# Patient Record
Sex: Male | Born: 1960 | Hispanic: No | Marital: Married | Smoking: Never smoker
Health system: Southern US, Community
[De-identification: ages and names within clinical notes are randomized; demographics above are authoritative.]

## PROBLEM LIST (undated history)

## (undated) DIAGNOSIS — E119 Type 2 diabetes mellitus without complications: Secondary | ICD-10-CM

## (undated) DIAGNOSIS — E785 Hyperlipidemia, unspecified: Secondary | ICD-10-CM

## (undated) HISTORY — DX: Hyperlipidemia, unspecified: E78.5

## (undated) HISTORY — DX: Type 2 diabetes mellitus without complications: E11.9

## (undated) HISTORY — PX: SINUS SURGERY WITH INSTATRAK: SHX5215

---

## 2014-12-06 ENCOUNTER — Ambulatory Visit (INDEPENDENT_AMBULATORY_CARE_PROVIDER_SITE_OTHER): Payer: BLUE CROSS/BLUE SHIELD | Admitting: Cardiology

## 2014-12-06 ENCOUNTER — Encounter: Payer: Self-pay | Admitting: Cardiology

## 2014-12-06 ENCOUNTER — Encounter: Payer: Self-pay | Admitting: *Deleted

## 2014-12-06 VITALS — BP 112/84 | HR 75 | Ht 68.0 in | Wt 269.5 lb

## 2014-12-06 DIAGNOSIS — I517 Cardiomegaly: Secondary | ICD-10-CM

## 2014-12-06 DIAGNOSIS — R062 Wheezing: Secondary | ICD-10-CM

## 2014-12-06 NOTE — Patient Instructions (Signed)
Your physician recommends that you schedule a follow-up appointment in: AS NEEDED PENDING TEST RESULTS  Your physician has requested that you have an echocardiogram. Echocardiography is a painless test that uses sound waves to create images of your heart. It provides your doctor with information about the size and shape of your heart and how well your heart's chambers and valves are working. This procedure takes approximately one hour. There are no restrictions for this procedure.    

## 2014-12-06 NOTE — Assessment & Plan Note (Signed)
Symptoms seem most consistent with asthma. If LV function normal with treat with bronchodilators. I will leave this to primary care.

## 2014-12-06 NOTE — Assessment & Plan Note (Signed)
Discussed weight loss. 

## 2014-12-06 NOTE — Progress Notes (Signed)
     HPI: 54 year old male for evaluation of cardiomegaly. This was noted on recent chest x-ray. Patient has noticed some mild cough and wheezing recently. He had his chest x-ray for that reason. He denies dyspnea on exertion, orthopnea, PND, pedal edema, chest pain, palpitations or syncope.  No current outpatient prescriptions on file.   No current facility-administered medications for this visit.    No Known Allergies   Past Medical History  Diagnosis Date  . Diabetes     Diet controlled  . Hyperlipidemia     Past Surgical History  Procedure Laterality Date  . Sinus surgery with instatrak      History   Social History  . Marital Status: Married    Spouse Name: N/A    Number of Children: 2  . Years of Education: N/A   Occupational History  . Not on file.   Social History Main Topics  . Smoking status: Never Smoker   . Smokeless tobacco: Not on file  . Alcohol Use: No  . Drug Use: No  . Sexual Activity: Not on file   Other Topics Concern  . Not on file   Social History Narrative    Family History  Problem Relation Age of Onset  . Heart disease      No family history    ROS: no fevers or chills, productive cough, hemoptysis, dysphasia, odynophagia, melena, hematochezia, dysuria, hematuria, rash, seizure activity, orthopnea, PND, pedal edema, claudication. Remaining systems are negative.  Physical Exam:   Blood pressure 112/84, pulse 75, height 5\' 8"  (1.727 m), weight 269 lb 8 oz (122.244 kg).  General:  Well developed/obese in NAD Skin warm/dry Patient not depressed No peripheral clubbing Back-normal HEENT-normal/normal eyelids Neck supple/normal carotid upstroke bilaterally; no bruits; no JVD; no thyromegaly chest - diffuse expiratory wheeze with forced expiration. CV - RRR/normal S1 and S2; no murmurs, rubs or gallops;  PMI nondisplaced Abdomen -NT/ND, no HSM, no mass, + bowel sounds, no bruit 2+ femoral pulses, no bruits Ext-no edema, chords,  2+ DP Neuro-grossly nonfocal  ECG sinus rhythm at a rate of 75. No ST changes.

## 2014-12-06 NOTE — Assessment & Plan Note (Signed)
Patient's symptoms seem most consistent with asthma. Cardiomegaly was noted on chest x-ray. I will schedule an echocardiogram to assess LV function. If normal would not pursue further cardiac evaluation.

## 2014-12-08 ENCOUNTER — Ambulatory Visit (HOSPITAL_COMMUNITY)
Admission: RE | Admit: 2014-12-08 | Discharge: 2014-12-08 | Disposition: A | Discharge status: Home / Self Care | Payer: BLUE CROSS/BLUE SHIELD | Source: Ambulatory Visit | Attending: Internal Medicine | Admitting: Internal Medicine

## 2014-12-08 DIAGNOSIS — I517 Cardiomegaly: Secondary | ICD-10-CM | POA: Diagnosis present

## 2014-12-08 NOTE — Progress Notes (Signed)
2D Echo Performed 12/08/2014    Beula Joyner, RCS  

## 2014-12-29 ENCOUNTER — Ambulatory Visit: Payer: Self-pay | Admitting: Cardiovascular Disease

## 2015-01-03 ENCOUNTER — Other Ambulatory Visit (HOSPITAL_COMMUNITY): Payer: Self-pay | Admitting: Radiology

## 2015-01-03 DIAGNOSIS — R062 Wheezing: Secondary | ICD-10-CM

## 2015-01-04 ENCOUNTER — Other Ambulatory Visit (HOSPITAL_COMMUNITY): Payer: Self-pay | Admitting: Radiology

## 2015-02-15 ENCOUNTER — Encounter (HOSPITAL_COMMUNITY): Payer: Self-pay | Admitting: Emergency Medicine

## 2015-02-15 ENCOUNTER — Emergency Department (HOSPITAL_COMMUNITY)
Admission: EM | Admit: 2015-02-15 | Discharge: 2015-02-15 | Disposition: A | Discharge status: Home / Self Care | Payer: BLUE CROSS/BLUE SHIELD | Source: Home / Self Care | Attending: Emergency Medicine | Admitting: Emergency Medicine

## 2015-02-15 ENCOUNTER — Emergency Department (INDEPENDENT_AMBULATORY_CARE_PROVIDER_SITE_OTHER): Payer: BLUE CROSS/BLUE SHIELD

## 2015-02-15 DIAGNOSIS — R062 Wheezing: Secondary | ICD-10-CM

## 2015-02-15 LAB — BRAIN NATRIURETIC PEPTIDE: B Natriuretic Peptide: 7.1 pg/mL (ref 0.0–100.0)

## 2015-02-15 LAB — POCT I-STAT, CHEM 8
BUN: 6 mg/dL (ref 6–23)
CREATININE: 0.8 mg/dL (ref 0.50–1.35)
Calcium, Ion: 1.12 mmol/L (ref 1.12–1.23)
Chloride: 99 mmol/L (ref 96–112)
Glucose, Bld: 100 mg/dL — ABNORMAL HIGH (ref 70–99)
HCT: 45 % (ref 39.0–52.0)
Hemoglobin: 15.3 g/dL (ref 13.0–17.0)
Potassium: 3.8 mmol/L (ref 3.5–5.1)
SODIUM: 137 mmol/L (ref 135–145)
TCO2: 23 mmol/L (ref 0–100)

## 2015-02-15 MED ORDER — IPRATROPIUM BROMIDE 0.02 % IN SOLN
RESPIRATORY_TRACT | Status: AC
  Filled 2015-02-15: qty 2.5

## 2015-02-15 MED ORDER — ALBUTEROL SULFATE (2.5 MG/3ML) 0.083% IN NEBU
INHALATION_SOLUTION | RESPIRATORY_TRACT | Status: AC
  Filled 2015-02-15: qty 6

## 2015-02-15 MED ORDER — IPRATROPIUM BROMIDE 0.02 % IN SOLN
0.5000 mg | Freq: Once | RESPIRATORY_TRACT | Status: AC
  Administered 2015-02-15: 0.5 mg via RESPIRATORY_TRACT

## 2015-02-15 MED ORDER — AEROCHAMBER PLUS FLO-VU LARGE MISC: 1.0000 | Freq: Once | Status: AC

## 2015-02-15 MED ORDER — ALBUTEROL SULFATE (2.5 MG/3ML) 0.083% IN NEBU
5.0000 mg | INHALATION_SOLUTION | Freq: Once | RESPIRATORY_TRACT | Status: AC
  Administered 2015-02-15: 5 mg via RESPIRATORY_TRACT

## 2015-02-15 MED ORDER — METHYLPREDNISOLONE SODIUM SUCC 125 MG IJ SOLR
125.0000 mg | Freq: Once | INTRAMUSCULAR | Status: AC
  Administered 2015-02-15: 125 mg via INTRAMUSCULAR

## 2015-02-15 MED ORDER — METHYLPREDNISOLONE SODIUM SUCC 125 MG IJ SOLR
INTRAMUSCULAR | Status: AC
  Filled 2015-02-15: qty 2

## 2015-02-15 MED ORDER — FLUTICASONE-SALMETEROL 250-50 MCG/DOSE IN AEPB: 1.0000 | INHALATION_SPRAY | Freq: Two times a day (BID) | RESPIRATORY_TRACT | Status: DC

## 2015-02-15 MED ORDER — ALBUTEROL SULFATE HFA 108 (90 BASE) MCG/ACT IN AERS: 2.0000 | INHALATION_SPRAY | RESPIRATORY_TRACT | Status: DC | PRN

## 2015-02-15 MED ORDER — PREDNISONE 50 MG PO TABS: 50.0000 mg | ORAL_TABLET | Freq: Every day | ORAL | Status: DC

## 2015-02-15 NOTE — ED Provider Notes (Signed)
CSN: [id number]     Arrival date & time 02/15/15  1233 History   First MD Initiated Contact with Patient 02/15/15 1425     Chief Complaint  Patient presents with  . Shortness of Breath   (Consider location/radiation/quality/duration/timing/severity/associated sxs/prior Treatment) HPI        54 year old male presents for evaluation of wheezing and shortness of breath and coughing. He says this problem has been going on intermittently for 6 months but it got worse 3 months ago. He went to his primary care provider and had a chest x-ray and an EKG and was referred to cardiology. He had a normal echo and was told to follow-up with primary care. He has now been referred to pulmonology, he has an appointment in 2 weeks. However he he and his wife are concerned that his symptoms are worsening and they would like to be evaluated again today. They say his wheezing much worse at night. He was supposed to get a sleep study but he has not done that yet. He has chronic leg swelling that is worse when he is on his feet for a long time, he is unsure exactly how long he has had that. Denies any chest pain or any previous history of asthma. His primary care providers not made any attempts at treatment for his symptoms thus far. He used his daughter's nebulized Xopenex 2 days ago which he feels helped.   Past Medical History  Diagnosis Date  . Diabetes     Diet controlled  . Hyperlipidemia    Past Surgical History  Procedure Laterality Date  . Sinus surgery with instatrak     Family History  Problem Relation Age of Onset  . Heart disease      No family history   History  Substance Use Topics  . Smoking status: Never Smoker   . Smokeless tobacco: Not on file  . Alcohol Use: No    Review of Systems  All other systems reviewed and are negative.   Allergies  Review of patient's allergies indicates no known allergies.  Home Medications   Prior to Admission medications   Medication Sig Start Date  End Date Taking? Authorizing Provider  albuterol (PROVENTIL HFA;VENTOLIN HFA) 108 (90 BASE) MCG/ACT inhaler Inhale 2 puffs into the lungs every 4 (four) hours as needed for wheezing. 02/15/15   Graylon Good, PA-C  Fluticasone-Salmeterol (ADVAIR DISKUS) 250-50 MCG/DOSE AEPB Inhale 1 puff into the lungs every 12 (twelve) hours. 02/15/15   Graylon Good, PA-C  predniSONE (DELTASONE) 50 MG tablet Take 1 tablet (50 mg total) by mouth daily with breakfast. 02/15/15   Graylon Good, PA-C  Spacer/Aero-Holding Chambers (AEROCHAMBER PLUS FLO-VU LARGE) MISC 1 each by Other route once. 02/15/15   Adrian Blackwater Latoria Dry, PA-C   BP 125/83 mmHg  Pulse 80  Temp(Src) 97.1 F (36.2 C) (Oral)  Resp 16  SpO2 97% Physical Exam  Constitutional: He is oriented to person, place, and time. He appears well-developed and well-nourished. No distress.  HENT:  Head: Normocephalic.  Pulmonary/Chest: Effort normal. No respiratory distress.  Neurological: He is alert and oriented to person, place, and time. Coordination normal.  Skin: Skin is warm and dry. No rash noted. He is not diaphoretic.  Psychiatric: He has a normal mood and affect. Judgment normal.  Nursing note and vitals reviewed.   ED Course  ED EKG  Date/Time: 02/15/2015 3:13 PM Performed by: Graylon Good Authorized by: Graylon Good Interpreted by ED physician Previous  ECG: no previous ECG available Rhythm: sinus rhythm Rate: normal QRS axis: normal Conduction: conduction normal ST Segments: ST segments normal T Waves: T waves normal Other: no other findings Clinical impression: normal ECG Comments: EKG independently reviewed by me as above   (including critical care time) Labs Review Labs Reviewed  POCT I-STAT, CHEM 8 - Abnormal; Notable for the following:    Glucose, Bld 100 (*)    All other components within normal limits  BRAIN NATRIURETIC PEPTIDE    Imaging Review Dg Chest 2 View  02/15/2015   CLINICAL DATA:  Cough and wheezing  for 1 week.  EXAM: CHEST  2 VIEW  COMPARISON:  12/02/2014  FINDINGS: There is chronic cardiomegaly. Pulmonary vascularity is normal than the lungs are clear. No effusions. No acute osseous abnormality.  IMPRESSION: Chronic cardiomegaly.  No acute abnormality.   Electronically Signed   By: Francene BoyersJames  Maxwell M.D.   On: 02/15/2015 15:11     MDM   1. Wheezing    After 1 nebulizer treatment, he has mild subjective improvement but he is still wheezing. He does not feel short of breath right now. His labs and his x-ray both normal. This is most likely asthma which may be induced by allergies, he definitely needs to follow-up with pulmonology as scheduled. We'll start him on prednisone as well as albuterol and Advair, follow-up with pulmonology   Meds ordered this encounter  Medications  . albuterol (PROVENTIL) (2.5 MG/3ML) 0.083% nebulizer solution 5 mg    Sig:   . ipratropium (ATROVENT) nebulizer solution 0.5 mg    Sig:   . methylPREDNISolone sodium succinate (SOLU-MEDROL) 125 mg/2 mL injection 125 mg    Sig:   . Fluticasone-Salmeterol (ADVAIR DISKUS) 250-50 MCG/DOSE AEPB    Sig: Inhale 1 puff into the lungs every 12 (twelve) hours.    Dispense:  60 each    Refill:  0  . predniSONE (DELTASONE) 50 MG tablet    Sig: Take 1 tablet (50 mg total) by mouth daily with breakfast.    Dispense:  5 tablet    Refill:  0  . albuterol (PROVENTIL HFA;VENTOLIN HFA) 108 (90 BASE) MCG/ACT inhaler    Sig: Inhale 2 puffs into the lungs every 4 (four) hours as needed for wheezing.    Dispense:  1 Inhaler    Refill:  2  . Spacer/Aero-Holding Chambers (AEROCHAMBER PLUS FLO-VU LARGE) MISC    Sig: 1 each by Other route once.    Dispense:  1 each    Refill:  0     Graylon GoodZachary H Felicidad Sugarman, PA-C 02/15/15 1815

## 2015-02-15 NOTE — ED Notes (Signed)
C/o SOB, wheezing, coughing onset Sunday night Reports he wen to his PCP and had a chest xray and ekg done last week Has appt w/pulmonologist first week in May Alert, no signs of acute distress.  Denies fevers, chills

## 2015-02-15 NOTE — Discharge Instructions (Signed)
Metered Dose Inhaler with Spacer Inhaled medicines are the basis of treatment of asthma and other breathing problems. Inhaled medicine can only be effective if used properly. Good technique assures that the medicine reaches the lungs. Your health care provider has asked you to use a spacer with your inhaler to help you take the medicine more effectively. A spacer is a plastic tube with a mouthpiece on one end and an opening that connects to the inhaler on the other end. Metered dose inhalers (MDIs) are used to deliver a variety of inhaled medicines. These include quick relief or rescue medicines (such as bronchodilators) and controller medicines (such as corticosteroids). The medicine is delivered by pushing down on a metal canister to release a set amount of spray. If you are using different kinds of inhalers, use your quick relief medicine to open the airways 10-15 minutes before using a steroid if instructed to do so by your health care provider. If you are unsure which inhalers to use and the order of using them, ask your health care provider, nurse, or respiratory therapist. HOW TO USE THE INHALER WITH A SPACER 1. Remove cap from inhaler. 2. If you are using the inhaler for the first time, you will need to prime it. Shake the inhaler for 5 seconds and release four puffs into the air, away from your face. Ask your health care provider or pharmacist if you have questions about priming your inhaler. 3. Shake inhaler for 5 seconds before each breath in (inhalation). 4. Place the open end of the spacer onto the mouthpiece of the inhaler. 5. Position the inhaler so that the top of the canister faces up and the spacer mouthpiece faces you. 6. Put your index finger on the top of the medicine canister. Your thumb supports the bottom of the inhaler and the spacer. 7. Breathe out (exhale) normally and as completely as possible. 8. Immediately after exhaling, place the spacer between your teeth and into your  mouth. Close your mouth tightly around the spacer. 9. Press the canister down with the index finger to release the medicine. 10. At the same time as the canister is pressed, inhale deeply and slowly until the lungs are completely filled. This should take 4-6 seconds. Keep your tongue down and out of the way. 11. Hold the medicine in your lungs for 5-10 seconds (10 seconds is best). This helps the medicine get into the small airways of your lungs. Exhale. 12. Repeat inhaling deeply through the spacer mouthpiece. Again hold that breath for up to 10 seconds (10 seconds is best). Exhale slowly. If it is difficult to take this second deep breath through the spacer, breathe normally several times through the spacer. Remove the spacer from your mouth. 13. Wait at least 15-30 seconds between puffs. Continue with the above steps until you have taken the number of puffs your health care provider has ordered. Do not use the inhaler more than your health care provider directs you to. 14. Remove spacer from the inhaler and place cap on inhaler. 15. Follow the directions from your health care provider or the inhaler insert for cleaning the inhaler and spacer. If you are using a steroid inhaler, rinse your mouth with water after your last puff, gargle, and spit out the water. Do not swallow the water. AVOID:  Inhaling before or after starting the spray of medicine. It takes practice to coordinate your breathing with triggering the spray.  Inhaling through the nose (rather than the mouth) when triggering   the spray. HOW TO DETERMINE IF YOUR INHALER IS FULL OR NEARLY EMPTY You cannot know when an inhaler is empty by shaking it. A few inhalers are now being made with dose counters. Ask your health care provider for a prescription that has a dose counter if you feel you need that extra help. If your inhaler does not have a counter, ask your health care provider to help you determine the date you need to refill your  inhaler. Write the refill date on a calendar or your inhaler canister. Refill your inhaler 7-10 days before it runs out. Be sure to keep an adequate supply of medicine. This includes making sure it is not expired, and you have a spare inhaler.  SEEK MEDICAL CARE IF:   Symptoms are only partially relieved with your inhaler.  You are having trouble using your inhaler.  You experience some increase in phlegm. SEEK IMMEDIATE MEDICAL CARE IF:   You feel little or no relief with your inhalers. You are still wheezing and are feeling shortness of breath or tightness in your chest or both.  You have dizziness, headaches, or fast heart rate.  You have chills, fever, or night sweats.  There is a noticeable increase in phlegm production, or there is blood in the phlegm. Document Released: 10/14/2005 Document Revised: 02/28/2014 Document Reviewed: 04/01/2013 ExitCare Patient Information 2015 [employer]. This information is not intended to replace advice given to you by your health care provider. Make sure you discuss any questions you have with your health care provider.  

## 2015-03-02 ENCOUNTER — Encounter: Payer: Self-pay | Admitting: Internal Medicine

## 2015-03-02 ENCOUNTER — Encounter (INDEPENDENT_AMBULATORY_CARE_PROVIDER_SITE_OTHER): Payer: Self-pay

## 2015-03-02 ENCOUNTER — Ambulatory Visit (INDEPENDENT_AMBULATORY_CARE_PROVIDER_SITE_OTHER): Payer: BLUE CROSS/BLUE SHIELD | Admitting: Internal Medicine

## 2015-03-02 VITALS — BP 134/82 | HR 73 | Ht 68.0 in | Wt 274.0 lb

## 2015-03-02 DIAGNOSIS — J4520 Mild intermittent asthma, uncomplicated: Secondary | ICD-10-CM

## 2015-03-02 MED ORDER — MOMETASONE FURO-FORMOTEROL FUM 200-5 MCG/ACT IN AERO: INHALATION_SPRAY | RESPIRATORY_TRACT | Status: DC

## 2015-03-02 NOTE — Progress Notes (Signed)
Subjective:    Patient ID: George Wallace, male    DOB: December 25, 1960,   MRN: [id number]  HPI  [age] yobm never smoker, good ex tolerance with some chronic longterm  issues with nasal drainage / sinus infections > sinus surgery (remote/ pt does not recall date)  by Dr George Wallace and dx of allergies: ? Dust/grass > variable drainage and  maybe worse in spring / fall no real change then fall 2015 noted noisy breathing but more doe x 01/2015 so self referred to pulmonary clinic 03/02/2015    03/02/2015 1st [facility] office visit/ George Wallace   Chief Complaint  Patient presents with  . Advice Only    self referral for wheezing.  recently seen in [facility] urgent care for wheezing.  S/S present Xfew weeks.   around 5 years prior to OV  advair was rec but didn't feel he needed it though may have helped the noisy breathing  rec advair 2 weeks by UC also rec pred/ proair and finished all rx / did not stay on advair and not clear it helped but feels "fine now" and really just wants to know what to do if symptoms recur  Dyspnea with heavy exertion but not adls and when not wheezing really doesn't limit him from desired activity   No obvious day to day or daytime variabilty or assoc chronic cough or cp or chest tightness,   overt sinus or hb symptoms. No unusual exp hx or h/o childhood pna/ asthma or knowledge of premature birth.  Sleeping ok without nocturnal  or early am exacerbation  of respiratory  c/o's or need for noct saba. Also denies any obvious fluctuation of symptoms with weather or environmental changes or other aggravating or alleviating factors except as outlined above   Current Medications, Allergies, Complete Past Medical History, Past Surgical History, Family History, and Social History were reviewed in [facility] electronic medical record.         Review of Systems  Constitutional: Negative for fever and unexpected weight change.  HENT: Negative for congestion, dental problem, ear pain,  nosebleeds, postnasal drip, rhinorrhea, sinus pressure, sneezing, sore throat and trouble swallowing.   Eyes: Negative for redness and itching.  Respiratory: Positive for shortness of breath and wheezing. Negative for cough and chest tightness.   Cardiovascular: Negative for palpitations and leg swelling.  Gastrointestinal: Negative for nausea and vomiting.  Genitourinary: Negative for dysuria.  Musculoskeletal: Negative for joint swelling.  Skin: Negative for rash.  Neurological: Negative for headaches.  Hematological: Does not bruise/bleed easily.  Psychiatric/Behavioral: Negative for dysphoric mood. The patient is not nervous/anxious.        Objective:   Physical Exam  Obese pleasant amb  bm nad   Wt Readings from Last 3 Encounters:  03/02/15 274 lb (124.286 kg)  12/06/14 269 lb 8 oz (122.244 kg)    Vital signs reviewed    HEENT: nl dentition, turbinates, and orophanx. Nl external ear canals without cough reflex   NECK :  without JVD/Nodes/TM/ nl carotid upstrokes bilaterally   LUNGS: no acc muscle use, clear to A and P bilaterally without cough on insp or exp maneuvers   CV:  RRR  no s3 or murmur or increase in P2, no edema   ABD:  soft and nontender with nl excursion in the supine position. No bruits or organomegaly, bowel sounds nl  MS:  warm without deformities, calf tenderness, cyanosis or clubbing  SKIN: warm and dry without lesions    NEURO:  alert, approp, no deficits     I personally reviewed images and agree with radiology impression as follows:  CXR:  02/15/15 Chronic cardiomegaly. No acute abnormality.        Assessment & Plan:

## 2015-03-02 NOTE — Patient Instructions (Signed)
At onset of the noisy breathing problem try first try dulera 200 Take 2 puffs first thing in am and then another 2 puffs about 12 hours later.   Work on inhaler technique:  relax and gently blow all the way out then take a nice smooth deep breath back in, triggering the inhaler at same time you start breathing in.  Hold for up to 5 seconds if you can and out thru the nose .  Rinse and gargle with water when done  Zyrtec would be the best choice to take as needed for itchy sneezy runny noise   If not better rec  Over the counter:   prilosec 20mg   Take 30-60 min before first meal of the day and Pepcid 20 mg one bedtime until  Better  I think of reflux for wheezing/ congestion/ coughing  like I do oxygen for fire (doesn't cause the fire but once you get the oxygen suppressed it usually goes away regardless of the exact cause).   GERD (REFLUX)  is an extremely common cause of respiratory symptoms just like yours , many times with no obvious heartburn at all.    It can be treated with medication, but also with lifestyle changes including avoidance of late meals, excessive alcohol, smoking cessation, and avoid fatty foods, chocolate, peppermint, colas, red wine, and acidic juices such as orange juice.  NO MINT OR MENTHOL PRODUCTS SO NO COUGH DROPS  USE SUGARLESS CANDY INSTEAD (Jolley ranchers or Stover's or Life Savers) or even ice chips will also do - the key is to swallow to prevent all throat clearing. NO OIL BASED VITAMINS - use powdered substitutes.     If you are satisfied with your treatment plan,  let your doctor know and he/she can either refill your medications or you can return here when your prescription runs out.     If in any way you are not 100% satisfied,  please tell us.  If 100% better, tell your friends!  Pulmonary follow up is as needed

## 2015-03-04 ENCOUNTER — Encounter: Payer: Self-pay | Admitting: Internal Medicine

## 2015-03-04 DIAGNOSIS — J4520 Mild intermittent asthma, uncomplicated: Secondary | ICD-10-CM | POA: Insufficient documentation

## 2015-03-04 NOTE — Assessment & Plan Note (Signed)
He clearly has intermttent/ not chronic symptoms at this point attributable to asthma assoc with rhinitis/ mild atopy but is not interested in maint rx but warned him today that his symptoms if not well controlled could result in more of a chronic pattern and acute exacerbations which could be fatal  Best option is this setting is dulera 200 2bid and add gerd rx if not improving  The proper method of use, as well as anticipated side effects, of a metered-dose inhaler are discussed and demonstrated to the patient. Improved effectiveness after extensive coaching during this visit to a level of approximately  75% from a baseline of 50%  F/u can be prn if not 100% satisfied with response.

## 2015-05-19 NOTE — ED Notes (Signed)
Call from insurance adjuster w [employer] asking for clarification on note. Was referred back to Dr Piedad Climes for resolution of her questions

## 2015-05-23 NOTE — ED Notes (Signed)
Patient has a set of insurance papers.  Patient is requesting the return date be put on his papers.  zack baker, pa is no longer with this department.  Dr Piedad Climes completed first set of papers, but did not write return date on papers- made statement on paperwork that patient had not been seen since 02/15/15.  Dr Piedad Climes will assist with completion of papers once she has evaluated patient.  Patient does not understand that Dr Piedad Climes has an obligation to the patient, to see the patient, and not make return to work statements without examining patient.  Instructed patient to return at 1:00 pm to check in and be seen by Dr Piedad Climes.  Provided Scott Johnson's card to patient.

## 2015-05-25 NOTE — ED Notes (Signed)
Spoke with [employer] and they wanted to know patient return back to work date. (02/18/15) Dates reported and [employer] advisor verbal understands

## 2015-05-29 ENCOUNTER — Other Ambulatory Visit (HOSPITAL_COMMUNITY): Payer: Self-pay | Admitting: Respiratory Therapy

## 2020-02-10 DIAGNOSIS — E785 Hyperlipidemia, unspecified: Secondary | ICD-10-CM | POA: Diagnosis not present

## 2020-02-10 DIAGNOSIS — E1169 Type 2 diabetes mellitus with other specified complication: Secondary | ICD-10-CM | POA: Diagnosis not present

## 2020-02-10 DIAGNOSIS — Z0000 Encounter for general adult medical examination without abnormal findings: Secondary | ICD-10-CM | POA: Diagnosis not present

## 2020-03-30 DIAGNOSIS — E119 Type 2 diabetes mellitus without complications: Secondary | ICD-10-CM | POA: Diagnosis not present

## 2020-03-30 DIAGNOSIS — H5212 Myopia, left eye: Secondary | ICD-10-CM | POA: Diagnosis not present

## 2020-06-22 DIAGNOSIS — Z1159 Encounter for screening for other viral diseases: Secondary | ICD-10-CM | POA: Diagnosis not present

## 2020-06-27 DIAGNOSIS — Z86010 Personal history of colonic polyps: Secondary | ICD-10-CM | POA: Diagnosis not present

## 2020-06-27 DIAGNOSIS — K5730 Diverticulosis of large intestine without perforation or abscess without bleeding: Secondary | ICD-10-CM | POA: Diagnosis not present

## 2020-08-16 DIAGNOSIS — E559 Vitamin D deficiency, unspecified: Secondary | ICD-10-CM | POA: Diagnosis not present

## 2020-08-16 DIAGNOSIS — E785 Hyperlipidemia, unspecified: Secondary | ICD-10-CM | POA: Diagnosis not present

## 2020-08-16 DIAGNOSIS — E1169 Type 2 diabetes mellitus with other specified complication: Secondary | ICD-10-CM | POA: Diagnosis not present

## 2020-10-19 DIAGNOSIS — E559 Vitamin D deficiency, unspecified: Secondary | ICD-10-CM | POA: Diagnosis not present

## 2020-10-19 DIAGNOSIS — E1169 Type 2 diabetes mellitus with other specified complication: Secondary | ICD-10-CM | POA: Diagnosis not present

## 2020-10-19 DIAGNOSIS — E785 Hyperlipidemia, unspecified: Secondary | ICD-10-CM | POA: Diagnosis not present

## 2020-10-26 ENCOUNTER — Ambulatory Visit: Payer: BLUE CROSS/BLUE SHIELD | Attending: Internal Medicine

## 2020-10-26 DIAGNOSIS — Z23 Encounter for immunization: Secondary | ICD-10-CM

## 2020-10-26 NOTE — Progress Notes (Signed)
   Covid-19 Vaccination Clinic  Name:  Jaleen Finch    MRN: [id number] DOB: 01-21-1961  10/26/2020  Mr. Denicola was observed post Covid-19 immunization for 15 minutes without incident. He was provided with Vaccine Information Sheet and instruction to access the V-Safe system.   Mr. Oelkers was instructed to call 911 with any severe reactions post vaccine: Marland Kitchen Difficulty breathing  . Swelling of face and throat  . A fast heartbeat  . A bad rash all over body  . Dizziness and weakness   Immunizations Administered    Name Date Dose VIS Date Route   Pfizer COVID-19 Vaccine 10/26/2020  1:36 PM 0.3 mL 08/16/2020 Intramuscular   Manufacturer: [employer], [employer]   Lot: [id number]   NDC: [address]-1

## 2021-02-26 DIAGNOSIS — Z125 Encounter for screening for malignant neoplasm of prostate: Secondary | ICD-10-CM | POA: Diagnosis not present

## 2021-02-26 DIAGNOSIS — E1169 Type 2 diabetes mellitus with other specified complication: Secondary | ICD-10-CM | POA: Diagnosis not present

## 2021-02-26 DIAGNOSIS — E785 Hyperlipidemia, unspecified: Secondary | ICD-10-CM | POA: Diagnosis not present

## 2021-03-22 DIAGNOSIS — Z0000 Encounter for general adult medical examination without abnormal findings: Secondary | ICD-10-CM | POA: Diagnosis not present

## 2021-03-22 DIAGNOSIS — G4710 Hypersomnia, unspecified: Secondary | ICD-10-CM | POA: Diagnosis not present

## 2021-03-22 DIAGNOSIS — E1169 Type 2 diabetes mellitus with other specified complication: Secondary | ICD-10-CM | POA: Diagnosis not present

## 2021-03-22 DIAGNOSIS — E785 Hyperlipidemia, unspecified: Secondary | ICD-10-CM | POA: Diagnosis not present

## 2021-04-25 DIAGNOSIS — R0683 Snoring: Secondary | ICD-10-CM | POA: Diagnosis not present

## 2021-04-25 DIAGNOSIS — R0681 Apnea, not elsewhere classified: Secondary | ICD-10-CM | POA: Diagnosis not present

## 2021-05-18 DIAGNOSIS — H524 Presbyopia: Secondary | ICD-10-CM | POA: Diagnosis not present

## 2021-05-18 DIAGNOSIS — E119 Type 2 diabetes mellitus without complications: Secondary | ICD-10-CM | POA: Diagnosis not present

## 2021-05-23 DIAGNOSIS — G4733 Obstructive sleep apnea (adult) (pediatric): Secondary | ICD-10-CM | POA: Diagnosis not present

## 2021-07-11 DIAGNOSIS — E785 Hyperlipidemia, unspecified: Secondary | ICD-10-CM | POA: Diagnosis not present

## 2021-09-26 DIAGNOSIS — E1169 Type 2 diabetes mellitus with other specified complication: Secondary | ICD-10-CM | POA: Diagnosis not present

## 2021-09-26 DIAGNOSIS — E785 Hyperlipidemia, unspecified: Secondary | ICD-10-CM | POA: Diagnosis not present

## 2021-10-01 DIAGNOSIS — Z23 Encounter for immunization: Secondary | ICD-10-CM | POA: Diagnosis not present

## 2021-10-01 DIAGNOSIS — E1169 Type 2 diabetes mellitus with other specified complication: Secondary | ICD-10-CM | POA: Diagnosis not present

## 2021-10-01 DIAGNOSIS — E785 Hyperlipidemia, unspecified: Secondary | ICD-10-CM | POA: Diagnosis not present

## 2021-10-01 DIAGNOSIS — G4733 Obstructive sleep apnea (adult) (pediatric): Secondary | ICD-10-CM | POA: Diagnosis not present

## 2021-10-01 DIAGNOSIS — R41840 Attention and concentration deficit: Secondary | ICD-10-CM | POA: Diagnosis not present

## 2022-04-01 DIAGNOSIS — E1169 Type 2 diabetes mellitus with other specified complication: Secondary | ICD-10-CM | POA: Diagnosis not present

## 2022-04-01 DIAGNOSIS — E785 Hyperlipidemia, unspecified: Secondary | ICD-10-CM | POA: Diagnosis not present

## 2022-04-01 DIAGNOSIS — Z125 Encounter for screening for malignant neoplasm of prostate: Secondary | ICD-10-CM | POA: Diagnosis not present

## 2022-04-08 ENCOUNTER — Other Ambulatory Visit: Payer: Self-pay | Admitting: Family Medicine

## 2022-04-08 DIAGNOSIS — E785 Hyperlipidemia, unspecified: Secondary | ICD-10-CM

## 2022-04-08 DIAGNOSIS — R062 Wheezing: Secondary | ICD-10-CM | POA: Diagnosis not present

## 2022-04-08 DIAGNOSIS — Z0001 Encounter for general adult medical examination with abnormal findings: Secondary | ICD-10-CM | POA: Diagnosis not present

## 2022-04-08 DIAGNOSIS — E1169 Type 2 diabetes mellitus with other specified complication: Secondary | ICD-10-CM | POA: Diagnosis not present

## 2022-04-15 ENCOUNTER — Ambulatory Visit
Admission: RE | Admit: 2022-04-15 | Discharge: 2022-04-15 | Disposition: A | Discharge status: Home / Self Care | Payer: BLUE CROSS/BLUE SHIELD | Source: Ambulatory Visit | Attending: Family Medicine | Admitting: Family Medicine

## 2022-04-15 DIAGNOSIS — E785 Hyperlipidemia, unspecified: Secondary | ICD-10-CM

## 2022-04-18 ENCOUNTER — Other Ambulatory Visit: Payer: Self-pay | Admitting: Family Medicine

## 2022-04-18 DIAGNOSIS — J9859 Other diseases of mediastinum, not elsewhere classified: Secondary | ICD-10-CM

## 2022-04-29 ENCOUNTER — Ambulatory Visit
Admission: RE | Admit: 2022-04-29 | Discharge: 2022-04-29 | Disposition: A | Discharge status: Home / Self Care | Payer: BLUE CROSS/BLUE SHIELD | Source: Ambulatory Visit | Attending: Family Medicine | Admitting: Family Medicine

## 2022-04-29 DIAGNOSIS — R911 Solitary pulmonary nodule: Secondary | ICD-10-CM | POA: Diagnosis not present

## 2022-04-29 DIAGNOSIS — K760 Fatty (change of) liver, not elsewhere classified: Secondary | ICD-10-CM | POA: Diagnosis not present

## 2022-04-29 DIAGNOSIS — J9859 Other diseases of mediastinum, not elsewhere classified: Secondary | ICD-10-CM

## 2022-04-29 MED ORDER — IOPAMIDOL (ISOVUE-300) INJECTION 61%
100.0000 mL | Freq: Once | INTRAVENOUS | Status: AC | PRN
  Administered 2022-04-29: 100 mL via INTRAVENOUS

## 2022-05-03 ENCOUNTER — Other Ambulatory Visit: Payer: Self-pay

## 2022-05-07 DIAGNOSIS — E785 Hyperlipidemia, unspecified: Secondary | ICD-10-CM | POA: Diagnosis not present

## 2022-05-07 DIAGNOSIS — I2584 Coronary atherosclerosis due to calcified coronary lesion: Secondary | ICD-10-CM | POA: Diagnosis not present

## 2022-05-07 DIAGNOSIS — E1169 Type 2 diabetes mellitus with other specified complication: Secondary | ICD-10-CM | POA: Diagnosis not present

## 2022-05-24 ENCOUNTER — Encounter: Payer: BLUE CROSS/BLUE SHIELD | Admitting: Thoracic Surgery (Cardiothoracic Vascular Surgery)

## 2022-05-31 DIAGNOSIS — E119 Type 2 diabetes mellitus without complications: Secondary | ICD-10-CM | POA: Diagnosis not present

## 2022-05-31 DIAGNOSIS — J9859 Other diseases of mediastinum, not elsewhere classified: Secondary | ICD-10-CM | POA: Insufficient documentation

## 2022-05-31 DIAGNOSIS — R9389 Abnormal findings on diagnostic imaging of other specified body structures: Secondary | ICD-10-CM | POA: Diagnosis not present

## 2022-06-06 NOTE — Progress Notes (Signed)
[address]       [place] [address]             [phone]                    Vito Beg [facility] Medical Record #[id number] Date of Birth: 08/22/1961  Referring: Gweneth Dimitri, MD Primary Care: Gweneth Dimitri, MD Primary Cardiologist: None  Chief Complaint:    Chief Complaint  Patient presents with  . thymus mass    Surgical consult/ C/A/P CT 04/29/22? Cardiac CT 04/15/22     History of Present Illness:    George Wallace 61 y.o. 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  He was originally undergoing a CT scan for coronary calcium score, and a nodular mass was identified.  He does admit to some fatigue over the past several months.  He also has some occasional musculoskeletal chest pain when he leans over.  He denies any muscle weakness or neurologic symptoms.  His weight has been stable.  He occasionally has some night sweats.     Past Medical History:  Diagnosis Date  . Diabetes (HCC)    Diet controlled  . Hyperlipidemia     Past Surgical History:  Procedure Laterality Date  . SINUS SURGERY WITH INSTATRAK      Family History  Problem Relation Age of Onset  . Heart disease Other        No family history  . Emphysema Father      Social History   Tobacco Use  Smoking Status Never  Smokeless Tobacco Never  Tobacco Comments   both parents smoked in home growing up.     Social History   Substance and Sexual Activity  Alcohol Use No  . Alcohol/week: 0.0 standard drinks of alcohol     No Known Allergies  Current Outpatient Medications  Medication Sig Dispense Refill  . albuterol (PROVENTIL HFA;VENTOLIN HFA) 108 (90 BASE) MCG/ACT inhaler Inhale 2 puffs into the lungs every 4 (four) hours as needed for wheezing. 1 Inhaler 2  . lisinopril (ZESTRIL) 2.5 MG tablet Take 1.25 mg by mouth daily.    . metFORMIN (GLUCOPHAGE-XR) 500 MG 24 hr tablet Take 500 mg by mouth every  morning.    . mometasone-formoterol (DULERA) 200-5 MCG/ACT AERO Take 2 puffs first thing in am and then another 2 puffs about 12 hours later. 1 Inhaler 11  . rosuvastatin (CRESTOR) 10 MG tablet Take 10 mg by mouth daily.    Marland Kitchen Spacer/Aero-Holding Chambers (AEROCHAMBER PLUS FLO-VU LARGE) MISC 1 each by Other route once. 1 each 0   No current facility-administered medications for this visit.    Review of Systems  Constitutional:  Positive for diaphoresis and malaise/fatigue. Negative for fever and weight loss.  Respiratory:  Positive for cough and shortness of breath.   Cardiovascular:  Positive for chest pain. Negative for palpitations.  Gastrointestinal:  Negative for heartburn and nausea.  Musculoskeletal:  Negative for myalgias.  Neurological:  Negative for dizziness, tingling, sensory change, focal weakness and headaches.     PHYSICAL EXAMINATION: BP 133/84   Pulse 72   Resp 20   Ht 5\' 8"  (1.727 m)   Wt 283 lb (128.4 kg)   SpO2 98% Comment: ra  BMI 43.03 kg/m  Physical Exam Constitutional:      Appearance: He is normal weight.  HENT:     Head: Normocephalic and atraumatic.  Eyes:  Extraocular Movements: Extraocular movements intact.  Cardiovascular:     Rate and Rhythm: Normal rate and regular rhythm.     Pulses: Normal pulses.  Pulmonary:     Effort: Pulmonary effort is normal. No respiratory distress.  Abdominal:     General: There is no distension.  Musculoskeletal:        General: Normal range of motion.     Cervical back: Normal range of motion.  Skin:    General: Skin is warm and dry.  Neurological:     General: No focal deficit present.     Mental Status: He is alert and oriented to person, place, and time.    Diagnostic Studies & Laboratory data:     Recent Radiology Findings:   No results found.  CT chest: IMPRESSION: 1. Nodular and masslike areas of somewhat necrotic appearing soft tissue in the anterior mediastinum, extending inferiorly along  the lateral heart borders. 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 Lymphoma is considered less likely in the absence of adenopathy elsewhere. 2. Tiny pulmonary nodules measure 3 mm or less in size. Recommend attention on follow-up. 3. Debris in right lower lobe segmental bronchi with associated volume loss in the anterior right lower lobe. 4. Steatotic enlarged liver.      I have independently reviewed the above radiology studies  and reviewed the findings with the patient.   Recent Lab Findings: Lab Results  Component Value Date   HGB 15.3 02/15/2015   HCT 45.0 02/15/2015   GLUCOSE 100 (H) 02/15/2015   NA 137 02/15/2015   K 3.8 02/15/2015   CL 99 02/15/2015   CREATININE 0.80 02/15/2015   BUN 6 02/15/2015       Assessment / Plan:   61yo male with anterior mediastinal cystic and nodular mass.  Will order PET/CT and MRI chest.  Will obtain tumor markers.     I  spent {CHL ONC TIME VISIT - IHKVQ:[id number]} with  the patient face to face in counseling and coordination of care.    Corliss Skains 06/07/2022 4:37 PM

## 2022-06-07 ENCOUNTER — Other Ambulatory Visit: Payer: Self-pay | Admitting: Thoracic Surgery (Cardiothoracic Vascular Surgery)

## 2022-06-07 ENCOUNTER — Other Ambulatory Visit: Payer: Self-pay | Admitting: *Deleted

## 2022-06-07 ENCOUNTER — Institutional Professional Consult (permissible substitution) (INDEPENDENT_AMBULATORY_CARE_PROVIDER_SITE_OTHER): Payer: BC Managed Care – PPO | Admitting: Thoracic Surgery (Cardiothoracic Vascular Surgery)

## 2022-06-07 VITALS — BP 133/84 | HR 72 | Resp 20 | Ht 68.0 in | Wt 283.0 lb

## 2022-06-07 DIAGNOSIS — E328 Other diseases of thymus: Secondary | ICD-10-CM

## 2022-06-07 DIAGNOSIS — E559 Vitamin D deficiency, unspecified: Secondary | ICD-10-CM | POA: Insufficient documentation

## 2022-06-07 DIAGNOSIS — G4710 Hypersomnia, unspecified: Secondary | ICD-10-CM | POA: Insufficient documentation

## 2022-06-07 DIAGNOSIS — I700 Atherosclerosis of aorta: Secondary | ICD-10-CM | POA: Insufficient documentation

## 2022-06-07 DIAGNOSIS — E1169 Type 2 diabetes mellitus with other specified complication: Secondary | ICD-10-CM | POA: Insufficient documentation

## 2022-06-07 DIAGNOSIS — Z860100 Personal history of colon polyps, unspecified: Secondary | ICD-10-CM | POA: Insufficient documentation

## 2022-06-07 DIAGNOSIS — G4733 Obstructive sleep apnea (adult) (pediatric): Secondary | ICD-10-CM | POA: Insufficient documentation

## 2022-06-07 DIAGNOSIS — R41840 Attention and concentration deficit: Secondary | ICD-10-CM | POA: Insufficient documentation

## 2022-06-07 DIAGNOSIS — E785 Hyperlipidemia, unspecified: Secondary | ICD-10-CM | POA: Insufficient documentation

## 2022-06-07 LAB — TEST AUTHORIZATION: TEST CODE:: 8396

## 2022-06-08 LAB — HCG, TOTAL, QUANTITATIVE: hCG, Beta Chain, Quant, S: <5 m[IU]/mL (ref <5)

## 2022-06-10 LAB — TEST AUTHORIZATION

## 2022-06-10 LAB — LACTATE DEHYDROGENASE: LDH: 133 U/L (ref 120–250)

## 2022-06-10 LAB — AFP TUMOR MARKER: AFP-Tumor Marker: 2.1 ng/mL (ref <6.1)

## 2022-06-13 ENCOUNTER — Ambulatory Visit
Admission: RE | Admit: 2022-06-13 | Discharge: 2022-06-13 | Disposition: A | Discharge status: Home / Self Care | Payer: BC Managed Care – PPO | Source: Ambulatory Visit | Attending: Thoracic Surgery (Cardiothoracic Vascular Surgery) | Admitting: Thoracic Surgery (Cardiothoracic Vascular Surgery)

## 2022-06-13 DIAGNOSIS — E328 Other diseases of thymus: Secondary | ICD-10-CM

## 2022-06-13 DIAGNOSIS — J9859 Other diseases of mediastinum, not elsewhere classified: Secondary | ICD-10-CM | POA: Diagnosis not present

## 2022-06-13 MED ORDER — GADOBENATE DIMEGLUMINE 529 MG/ML IV SOLN
20.0000 mL | Freq: Once | INTRAVENOUS | Status: AC | PRN
  Administered 2022-06-13: 20 mL via INTRAVENOUS

## 2022-06-24 DIAGNOSIS — R599 Enlarged lymph nodes, unspecified: Secondary | ICD-10-CM | POA: Diagnosis not present

## 2022-06-24 DIAGNOSIS — J9859 Other diseases of mediastinum, not elsewhere classified: Secondary | ICD-10-CM | POA: Diagnosis not present

## 2022-06-24 DIAGNOSIS — M7989 Other specified soft tissue disorders: Secondary | ICD-10-CM | POA: Diagnosis not present

## 2022-06-24 DIAGNOSIS — Z01810 Encounter for preprocedural cardiovascular examination: Secondary | ICD-10-CM | POA: Diagnosis not present

## 2022-06-25 DIAGNOSIS — Z6841 Body Mass Index (BMI) 40.0 and over, adult: Secondary | ICD-10-CM | POA: Diagnosis not present

## 2022-06-25 DIAGNOSIS — D4989 Neoplasm of unspecified behavior of other specified sites: Secondary | ICD-10-CM | POA: Diagnosis not present

## 2022-07-05 ENCOUNTER — Ambulatory Visit (INDEPENDENT_AMBULATORY_CARE_PROVIDER_SITE_OTHER): Payer: BC Managed Care – PPO | Admitting: Thoracic Surgery (Cardiothoracic Vascular Surgery)

## 2022-07-05 VITALS — BP 124/79 | HR 76 | Resp 20 | Ht 68.0 in

## 2022-07-05 DIAGNOSIS — E328 Other diseases of thymus: Secondary | ICD-10-CM | POA: Diagnosis not present

## 2022-07-05 NOTE — Progress Notes (Signed)
     [address]       [place] [address]             [phone]       George Wallace comes and discussed the results from his labs and MRI.  All of his tumor markers were within normal limits.  His LDH was 133.  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  At this time he is also been seen at [facility] and underwent a PET/CT which showed increased avidity and mediastinal mass in one of the iliac lymph nodes.  There is no other significant uptake in any of the other nodal basins.  He informed me that he is scheduled to undergo a biopsy of the mediastinal mass at [facility].  I explained to him that I am not a complete agreement with that given the chance that this could potentially be a thymic carcinoma or thymoma.  I recommended that he take the MRI to the thoracic surgeon over at [facility] for further discussion.  I will touch base with him in 2 weeks for further decision-making.

## 2022-07-15 DIAGNOSIS — Z7984 Long term (current) use of oral hypoglycemic drugs: Secondary | ICD-10-CM | POA: Diagnosis not present

## 2022-07-15 DIAGNOSIS — E119 Type 2 diabetes mellitus without complications: Secondary | ICD-10-CM | POA: Diagnosis not present

## 2022-07-15 DIAGNOSIS — J9859 Other diseases of mediastinum, not elsewhere classified: Secondary | ICD-10-CM | POA: Diagnosis not present

## 2022-07-15 DIAGNOSIS — R590 Localized enlarged lymph nodes: Secondary | ICD-10-CM | POA: Diagnosis not present

## 2022-07-19 ENCOUNTER — Ambulatory Visit (INDEPENDENT_AMBULATORY_CARE_PROVIDER_SITE_OTHER): Payer: BC Managed Care – PPO | Admitting: Thoracic Surgery (Cardiothoracic Vascular Surgery)

## 2022-07-19 DIAGNOSIS — J9859 Other diseases of mediastinum, not elsewhere classified: Secondary | ICD-10-CM

## 2022-07-19 NOTE — Progress Notes (Signed)
     [address]       [place] [address]             [phone]       Patient: Home Provider: Office Consent for Telemedicine visit obtained.  Today's visit was completed via a real-time telehealth (see specific modality noted below). The patient/authorized person provided oral consent at the time of the visit to engage in a telemedicine encounter with the present provider at [facility]. The patient/authorized person was informed of the potential benefits, limitations, and risks of telemedicine. The patient/authorized person expressed understanding that the laws that protect confidentiality also apply to telemedicine. The patient/authorized person acknowledged understanding that telemedicine does not provide emergency services and that he or she would need to call 911 or proceed to the nearest hospital for help if such a need arose.   Total time spent in the clinical discussion 10 minutes.  Telehealth Modality: Phone visit (audio only)  I had a telephone visit with the patient and his wife.  He underwent a biopsy at [facility] which was nondiagnostic.  He is scheduled to follow-up with surgeon to discuss surgical biopsy.  He remains undecided as regards to how he wants to proceed.  He will call us back to let us know his ultimate decision.

## 2022-07-26 ENCOUNTER — Encounter: Payer: BC Managed Care – PPO | Admitting: Thoracic Surgery (Cardiothoracic Vascular Surgery)

## 2022-08-02 ENCOUNTER — Encounter: Payer: Self-pay | Admitting: *Deleted

## 2022-08-02 ENCOUNTER — Other Ambulatory Visit: Payer: Self-pay | Admitting: *Deleted

## 2022-08-02 ENCOUNTER — Ambulatory Visit (INDEPENDENT_AMBULATORY_CARE_PROVIDER_SITE_OTHER): Payer: BC Managed Care – PPO | Admitting: Thoracic Surgery (Cardiothoracic Vascular Surgery)

## 2022-08-02 VITALS — BP 125/77 | HR 82 | Resp 18 | Ht 68.0 in | Wt 293.0 lb

## 2022-08-02 DIAGNOSIS — J9859 Other diseases of mediastinum, not elsewhere classified: Secondary | ICD-10-CM

## 2022-08-02 NOTE — Progress Notes (Signed)
     [address]       [place] [address]             [phone]       I had a long discussion with George Wallace and his wife.  They have elected to proceed with surgical resection.  We covered the risks and benefits of robotic assisted mediastinal mass resection.  Given the nature of this mass I explained that we likely will need to perform surgery on the right and the left pleural space.  We also covered the likelihood that the phrenic nerve may be involved especially on the left side.  Additionally we also covered the possibility that this may still be a lymphoma.  Our plan to resect this in this entirety was discussed and he is agreeable to proceed.  He is scheduled for early December.

## 2022-08-14 ENCOUNTER — Encounter: Payer: Self-pay | Admitting: *Deleted

## 2022-08-19 DIAGNOSIS — U071 COVID-19: Secondary | ICD-10-CM | POA: Diagnosis not present

## 2022-08-19 DIAGNOSIS — R52 Pain, unspecified: Secondary | ICD-10-CM | POA: Diagnosis not present

## 2022-08-19 DIAGNOSIS — R509 Fever, unspecified: Secondary | ICD-10-CM | POA: Diagnosis not present

## 2022-08-19 DIAGNOSIS — R051 Acute cough: Secondary | ICD-10-CM | POA: Diagnosis not present

## 2022-09-23 DIAGNOSIS — E785 Hyperlipidemia, unspecified: Secondary | ICD-10-CM | POA: Diagnosis not present

## 2022-09-23 DIAGNOSIS — E1169 Type 2 diabetes mellitus with other specified complication: Secondary | ICD-10-CM | POA: Diagnosis not present

## 2022-09-25 NOTE — H&P (View-Only) (Signed)
    [address]       [place] [address]             [phone]                    Vanderbilt [facility] Medical Record #[id number] Date of Birth: 01/11/1961  Referring: McNeill, Wendy, MD Primary Care: McNeill, Wendy, MD Primary Cardiologist: None  Chief Complaint:    Chief Complaint  Patient presents with   Mediastinal Mass    Further discuss surgery    History of Present Illness:    George Wallace 61 y.o. male presents to discuss surgical plans for resection of his anterior mediastinal mass.  There have been no events since his last appointment.  Of note he did undergo a PET/CT previously which showed avidity in the anterior mediastinal mass concerning for lymphoma.  Biopsies at [facility] were nonconclusive.  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   Original visit notes: Geoffrey Caylor 61 y.o. 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  He was originally undergoing a CT scan for coronary calcium score, and a nodular mass was identified.  He does admit to some fatigue over the past several months.  He also has some occasional musculoskeletal chest pain when he leans over.  He denies any muscle weakness or neurologic symptoms.  His weight has been stable.  He occasionally has some night sweats.    Past Medical History:  Diagnosis Date   Diabetes (HCC)    Diet controlled   Hyperlipidemia     Past Surgical History:  Procedure Laterality Date   SINUS SURGERY WITH INSTATRAK      Family History  Problem Relation Age of Onset   Heart disease Other        No family history   Emphysema Father      Social History   Tobacco Use  Smoking Status Never  Smokeless Tobacco Never  Tobacco Comments   both parents smoked in home growing up.     Social History   Substance and Sexual Activity  Alcohol Use No   Alcohol/week: 0.0 standard drinks of  alcohol     No Known Allergies  Current Outpatient Medications  Medication Sig Dispense Refill   albuterol (PROVENTIL HFA;VENTOLIN HFA) 108 (90 BASE) MCG/ACT inhaler Inhale 2 puffs into the lungs every 4 (four) hours as needed for wheezing. 1 Inhaler 2   lisinopril (ZESTRIL) 2.5 MG tablet Take 1.25 mg by mouth daily.     metFORMIN (GLUCOPHAGE-XR) 500 MG 24 hr tablet Take 500 mg by mouth every morning.     mometasone-formoterol (DULERA) 200-5 MCG/ACT AERO Take 2 puffs first thing in am and then another 2 puffs about 12 hours later. 1 Inhaler 11   rosuvastatin (CRESTOR) 10 MG tablet Take 10 mg by mouth daily.     Spacer/Aero-Holding Chambers (AEROCHAMBER PLUS FLO-VU LARGE) MISC 1 each by Other route once. 1 each 0   No current facility-administered medications for this visit.    Review of Systems  Constitutional: Negative.   Respiratory: Negative.    Cardiovascular: Negative.   Neurological: Negative.     PHYSICAL EXAMINATION: BP 118/76 (BP Location: Left Arm, Patient Position: Sitting, Cuff Size: Large)   Pulse 70   Ht 5' 8" (1.727 m)   Wt 295 lb (133.8 kg)   SpO2 98% Comment: RA  BMI 44.85 kg/m     Physical Exam Constitutional:      General: He is not in acute distress.    Appearance: Normal appearance. He is not ill-appearing.  HENT:     Head: Normocephalic and atraumatic.  Eyes:     Extraocular Movements: Extraocular movements intact.  Cardiovascular:     Rate and Rhythm: Normal rate.  Pulmonary:     Effort: Pulmonary effort is normal. No respiratory distress.  Abdominal:     General: There is no distension.  Musculoskeletal:        General: Normal range of motion.  Skin:    General: Skin is warm and dry.  Neurological:     General: No focal deficit present.     Mental Status: He is alert and oriented to person, place, and time.      Diagnostic Studies & Laboratory data:     Recent Radiology Findings:   CT Chest W Contrast  Result Date:  09/27/2022 CLINICAL DATA:  History of mediastinal mass in a patient withw no prior cancer history. All also with reported history of cough. * Tracking Code: BO * EXAM: CT CHEST WITH CONTRAST TECHNIQUE: Multidetector CT imaging of the chest was performed during intravenous contrast administration. RADIATION DOSE REDUCTION: This exam was performed according to the departmental dose-optimization program which includes automated exposure control, adjustment of the mA and/or kV according to patient size and/or use of iterative reconstruction technique. CONTRAST:  75mL ISOVUE-300 IOPAMIDOL (ISOVUE-300) INJECTION 61% COMPARISON:  None Available. FINDINGS: Cardiovascular: Heart size normal. No pericardial effusion. Nodularity along the pericardium particularly along the LEFT superior pericardial border (image 62/2, findings are centered mainly in the anterior mediastinum, see below. Aortic caliber is normal. Central pulmonary vasculature is unremarkable on venous phase. Mediastinum/Nodes: Esophagus grossly normal. Nodular masslike areas in the anterior mediastinum are similar to prior imaging. (Image 48/2) 5.2 x 3.4 cm along the RIGHT anterolateral mediastinum tracking along the RIGHT pericardium. (Image 62/2) 6.5 x 2.4 cm area along the LEFT heart border is stable. Some nodularity is inseparable from the superior pericardium. Other areas of nodularity similarly without change. Superior mediastinal lymph node (image 29/2) 10 mm unchanged. No thoracic inlet adenopathy. Small lymph nodes along the internal mammary chain without pathologic enlargement by CT criteria. These are unchanged and there is no new adenopathy in the chest. Lungs/Pleura: In no signs of consolidation. No sign of pleural effusion. Airways are patent. RIGHT upper lobe pulmonary nodule along the pleural surface 3 mm, stable compared to prior imaging from July of 2023. Other tiny areas of nodularity are similarly unchanged. For instance, a 4 mm nodule along  the medial pleural surface in the RIGHT chest. Subtle nodules along the major fissure in the LEFT chest are unchanged as are several 2-3 mm areas of nodularity along the pleura of the LEFT posterior chest. Upper Abdomen: Severe hepatic steatosis. No acute upper abdominal process. No signs of upper abdominal lymphadenopathy. Musculoskeletal: No acute musculoskeletal findings. Spinal degenerative changes. Degenerative changes are mild. IMPRESSION: 1. Nodular masslike areas in the anterior mediastinum are similar to prior imaging from July of 2023. Some nodularity is inseparable from the superior pericardium. Findings are centered mainly in the anterior mediastinum. 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 2. Stable small pulmonary nodules, most along the pleural surfaces. In the setting of potential thymic neoplasm these are mildly suspicious though there is no pleural effusion or change in nodularity. 3. Severe hepatic steatosis. Electronically Signed   By: Geoffrey    Wile M.D.   On: 09/27/2022 12:08       I have independently reviewed the above radiology studies  and reviewed the findings with the patient.   Recent Lab Findings: Lab Results  Component Value Date   HGB 15.3 02/15/2015   HCT 45.0 02/15/2015   GLUCOSE 100 (H) 02/15/2015   NA 137 02/15/2015   K 3.8 02/15/2015   CL 99 02/15/2015   CREATININE 0.80 02/15/2015   BUN 6 02/15/2015         Assessment / Plan:   61 year old male with large anterior mediastinal mass.  We discussed the risks and benefits of robotic assisted resection.  We will start out on the right side and then moved to the left side given the size.  I will obtain another CT scan prior to surgery.      Corliss Skains 09/27/2022 1:15 PM

## 2022-09-25 NOTE — Progress Notes (Signed)
[address]       [place] [address]             [phone]                    Yoni Lobos [facility] Medical Record #[id number] Date of Birth: Sep 07, 1961  Referring: Gweneth Dimitri, MD Primary Care: Gweneth Dimitri, MD Primary Cardiologist: None  Chief Complaint:    Chief Complaint  Patient presents with   Mediastinal Mass    Further discuss surgery    History of Present Illness:    George Wallace 61 y.o. male presents to discuss surgical plans for resection of his anterior mediastinal mass.  There have been no events since his last appointment.  Of note he did undergo a PET/CT previously which showed avidity in the anterior mediastinal mass concerning for lymphoma.  Biopsies at [facility] were nonconclusive.  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   Original visit notes: George Wallace 61 y.o. 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  He was originally undergoing a CT scan for coronary calcium score, and a nodular mass was identified.  He does admit to some fatigue over the past several months.  He also has some occasional musculoskeletal chest pain when he leans over.  He denies any muscle weakness or neurologic symptoms.  His weight has been stable.  He occasionally has some night sweats.    Past Medical History:  Diagnosis Date   Diabetes (HCC)    Diet controlled   Hyperlipidemia     Past Surgical History:  Procedure Laterality Date   SINUS SURGERY WITH INSTATRAK      Family History  Problem Relation Age of Onset   Heart disease Other        No family history   Emphysema Father      Social History   Tobacco Use  Smoking Status Never  Smokeless Tobacco Never  Tobacco Comments   both parents smoked in home growing up.     Social History   Substance and Sexual Activity  Alcohol Use No   Alcohol/week: 0.0 standard drinks of  alcohol     No Known Allergies  Current Outpatient Medications  Medication Sig Dispense Refill   albuterol (PROVENTIL HFA;VENTOLIN HFA) 108 (90 BASE) MCG/ACT inhaler Inhale 2 puffs into the lungs every 4 (four) hours as needed for wheezing. 1 Inhaler 2   lisinopril (ZESTRIL) 2.5 MG tablet Take 1.25 mg by mouth daily.     metFORMIN (GLUCOPHAGE-XR) 500 MG 24 hr tablet Take 500 mg by mouth every morning.     mometasone-formoterol (DULERA) 200-5 MCG/ACT AERO Take 2 puffs first thing in am and then another 2 puffs about 12 hours later. 1 Inhaler 11   rosuvastatin (CRESTOR) 10 MG tablet Take 10 mg by mouth daily.     Spacer/Aero-Holding Chambers (AEROCHAMBER PLUS FLO-VU LARGE) MISC 1 each by Other route once. 1 each 0   No current facility-administered medications for this visit.    Review of Systems  Constitutional: Negative.   Respiratory: Negative.    Cardiovascular: Negative.   Neurological: Negative.     PHYSICAL EXAMINATION: BP 118/76 (BP Location: Left Arm, Patient Position: Sitting, Cuff Size: Large)   Pulse 70   Ht 5\' 8"  (1.727 m)   Wt 295 lb (133.8 kg)   SpO2 98% Comment: RA  BMI 44.85 kg/m  Physical Exam Constitutional:      General: He is not in acute distress.    Appearance: Normal appearance. He is not ill-appearing.  HENT:     Head: Normocephalic and atraumatic.  Eyes:     Extraocular Movements: Extraocular movements intact.  Cardiovascular:     Rate and Rhythm: Normal rate.  Pulmonary:     Effort: Pulmonary effort is normal. No respiratory distress.  Abdominal:     General: There is no distension.  Musculoskeletal:        General: Normal range of motion.  Skin:    General: Skin is warm and dry.  Neurological:     General: No focal deficit present.     Mental Status: He is alert and oriented to person, place, and time.      Diagnostic Studies & Laboratory data:     Recent Radiology Findings:   CT Chest W Contrast  Result Date:  09/27/2022 CLINICAL DATA:  History of mediastinal mass in a patient withw no prior cancer history. All also with reported history of cough. * Tracking Code: BO * EXAM: CT CHEST WITH CONTRAST TECHNIQUE: Multidetector CT imaging of the chest was performed during intravenous contrast administration. RADIATION DOSE REDUCTION: This exam was performed according to the departmental dose-optimization program which includes automated exposure control, adjustment of the mA and/or kV according to patient size and/or use of iterative reconstruction technique. CONTRAST:  82mL ISOVUE-300 IOPAMIDOL (ISOVUE-300) INJECTION 61% COMPARISON:  None Available. FINDINGS: Cardiovascular: Heart size normal. No pericardial effusion. Nodularity along the pericardium particularly along the LEFT superior pericardial border (image 62/2, findings are centered mainly in the anterior mediastinum, see below. Aortic caliber is normal. Central pulmonary vasculature is unremarkable on venous phase. Mediastinum/Nodes: Esophagus grossly normal. Nodular masslike areas in the anterior mediastinum are similar to prior imaging. (Image 48/2) 5.2 x 3.4 cm along the RIGHT anterolateral mediastinum tracking along the RIGHT pericardium. (Image 62/2) 6.5 x 2.4 cm area along the LEFT heart border is stable. Some nodularity is inseparable from the superior pericardium. Other areas of nodularity similarly without change. Superior mediastinal lymph node (image 29/2) 10 mm unchanged. No thoracic inlet adenopathy. Small lymph nodes along the internal mammary chain without pathologic enlargement by CT criteria. These are unchanged and there is no new adenopathy in the chest. Lungs/Pleura: In no signs of consolidation. No sign of pleural effusion. Airways are patent. RIGHT upper lobe pulmonary nodule along the pleural surface 3 mm, stable compared to prior imaging from July of 2023. Other tiny areas of nodularity are similarly unchanged. For instance, a 4 mm nodule along  the medial pleural surface in the RIGHT chest. Subtle nodules along the major fissure in the LEFT chest are unchanged as are several 2-3 mm areas of nodularity along the pleura of the LEFT posterior chest. Upper Abdomen: Severe hepatic steatosis. No acute upper abdominal process. No signs of upper abdominal lymphadenopathy. Musculoskeletal: No acute musculoskeletal findings. Spinal degenerative changes. Degenerative changes are mild. IMPRESSION: 1. Nodular masslike areas in the anterior mediastinum are similar to prior imaging from July of 2023. Some nodularity is inseparable from the superior pericardium. Findings are centered mainly in the anterior mediastinum. 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 2. Stable small pulmonary nodules, most along the pleural surfaces. In the setting of potential thymic neoplasm these are mildly suspicious though there is no pleural effusion or change in nodularity. 3. Severe hepatic steatosis. Electronically Signed   By: Juliene Pina  Wile M.D.   On: 09/27/2022 12:08       I have independently reviewed the above radiology studies  and reviewed the findings with the patient.   Recent Lab Findings: Lab Results  Component Value Date   HGB 15.3 02/15/2015   HCT 45.0 02/15/2015   GLUCOSE 100 (H) 02/15/2015   NA 137 02/15/2015   K 3.8 02/15/2015   CL 99 02/15/2015   CREATININE 0.80 02/15/2015   BUN 6 02/15/2015         Assessment / Plan:   61 year old male with large anterior mediastinal mass.  We discussed the risks and benefits of robotic assisted resection.  We will start out on the right side and then moved to the left side given the size.  I will obtain another CT scan prior to surgery.      Corliss Skains 09/27/2022 1:15 PM

## 2022-09-26 ENCOUNTER — Other Ambulatory Visit (HOSPITAL_COMMUNITY): Payer: BC Managed Care – PPO

## 2022-09-27 ENCOUNTER — Encounter: Payer: Self-pay | Admitting: Thoracic Surgery (Cardiothoracic Vascular Surgery)

## 2022-09-27 ENCOUNTER — Ambulatory Visit (INDEPENDENT_AMBULATORY_CARE_PROVIDER_SITE_OTHER): Payer: BC Managed Care – PPO | Admitting: Thoracic Surgery (Cardiothoracic Vascular Surgery)

## 2022-09-27 ENCOUNTER — Other Ambulatory Visit: Payer: Self-pay | Admitting: Thoracic Surgery (Cardiothoracic Vascular Surgery)

## 2022-09-27 ENCOUNTER — Ambulatory Visit
Admission: RE | Admit: 2022-09-27 | Discharge: 2022-09-27 | Disposition: A | Discharge status: Home / Self Care | Payer: BC Managed Care – PPO | Source: Ambulatory Visit | Attending: Thoracic Surgery (Cardiothoracic Vascular Surgery) | Admitting: Thoracic Surgery (Cardiothoracic Vascular Surgery)

## 2022-09-27 VITALS — BP 118/76 | HR 70 | Ht 68.0 in | Wt 295.0 lb

## 2022-09-27 DIAGNOSIS — R911 Solitary pulmonary nodule: Secondary | ICD-10-CM | POA: Diagnosis not present

## 2022-09-27 DIAGNOSIS — J9859 Other diseases of mediastinum, not elsewhere classified: Secondary | ICD-10-CM

## 2022-09-27 MED ORDER — IOPAMIDOL (ISOVUE-300) INJECTION 61%
75.0000 mL | Freq: Once | INTRAVENOUS | Status: AC | PRN
  Administered 2022-09-27: 75 mL via INTRAVENOUS

## 2022-09-30 DIAGNOSIS — E785 Hyperlipidemia, unspecified: Secondary | ICD-10-CM | POA: Diagnosis not present

## 2022-09-30 DIAGNOSIS — E1169 Type 2 diabetes mellitus with other specified complication: Secondary | ICD-10-CM | POA: Diagnosis not present

## 2022-09-30 DIAGNOSIS — Z23 Encounter for immunization: Secondary | ICD-10-CM | POA: Diagnosis not present

## 2022-10-02 NOTE — Progress Notes (Signed)
Surgical Instructions    Your procedure is scheduled on Monday, 10/07/22.  Report to [facility] Main Entrance "A" at 5:30 A.M., then check in with the Admitting office.  Call this number if you have problems the morning of surgery:  [phone]   If you have any questions prior to your surgery date call [phone]: Open Monday-Friday 8am-4pm If you experience any cold or flu symptoms such as cough, fever, chills, shortness of breath, etc. between now and your scheduled surgery, please notify us at the above number     Remember:  Do not eat or drink after midnight the night before your surgery     Take these medicines the morning of surgery with A SIP OF WATER:  mometasone-formoterol (DULERA)  rosuvastatin (CRESTOR)   IF NEEDED: albuterol (PROVENTIL HFA;VENTOLIN HFA) 108 inhaler- bring with you the day of surgery  As of today, STOP taking any Aspirin (unless otherwise instructed by your surgeon) Aleve, Naproxen, Ibuprofen, Motrin, Advil, Goody's, BC's, all herbal medications, fish oil, and all vitamins.  WHAT DO I DO ABOUT MY DIABETES MEDICATION?   Do not take oral diabetes medicines (pills) the morning of surgery.   THE MORNING OF SURGERY,do not take metFORMIN (GLUCOPHAGE-XR).  The day of surgery, do not take other diabetes injectables, including Byetta (exenatide), Bydureon (exenatide ER), Victoza (liraglutide), or Trulicity (dulaglutide).  If your CBG is greater than 220 mg/dL, you may take  of your sliding scale (correction) dose of insulin.   HOW TO MANAGE YOUR DIABETES BEFORE AND AFTER SURGERY  Why is it important to control my blood sugar before and after surgery? Improving blood sugar levels before and after surgery helps healing and can limit problems. A way of improving blood sugar control is eating a healthy diet by:  Eating less sugar and carbohydrates  Increasing activity/exercise  Talking with your doctor about reaching your blood sugar goals High blood  sugars (greater than 180 mg/dL) can raise your risk of infections and slow your recovery, so you will need to focus on controlling your diabetes during the weeks before surgery. Make sure that the doctor who takes care of your diabetes knows about your planned surgery including the date and location.  How do I manage my blood sugar before surgery? Check your blood sugar at least 4 times a day, starting 2 days before surgery, to make sure that the level is not too high or low.  Check your blood sugar the morning of your surgery when you wake up and every 2 hours until you get to the Short Stay unit.  If your blood sugar is less than 70 mg/dL, you will need to treat for low blood sugar: Do not take insulin. Treat a low blood sugar (less than 70 mg/dL) with  cup of clear juice (cranberry or apple), 4 glucose tablets, OR glucose gel. Recheck blood sugar in 15 minutes after treatment (to make sure it is greater than 70 mg/dL). If your blood sugar is not greater than 70 mg/dL on recheck, call [phone] for further instructions. Report your blood sugar to the short stay nurse when you get to Short Stay.  If you are admitted to the hospital after surgery: Your blood sugar will be checked by the staff and you will probably be given insulin after surgery (instead of oral diabetes medicines) to make sure you have good blood sugar levels. The goal for blood sugar control after surgery is 80-180 mg/dL.  Do not wear jewelry or makeup. Do not wear lotions, powders, cologne or deodorant. Men may shave face and neck. Do not bring valuables to the hospital. Do not wear nail polish, gel polish, artificial nails, or any other type of covering on natural nails (fingers and toes) If you have artificial nails or gel coating that need to be removed by a nail salon, please have this removed prior to surgery. Artificial nails or gel coating may interfere with anesthesia's ability to adequately monitor  your vital signs.  [facility] is not responsible for any belongings or valuables.    Do NOT Smoke (Tobacco/Vaping)  24 hours prior to your procedure  If you use a CPAP at night, you may bring your mask for your overnight stay.   Contacts, glasses, hearing aids, dentures or partials may not be worn into surgery, please bring cases for these belongings   For patients admitted to the hospital, discharge time will be determined by your treatment team.   Patients discharged the day of surgery will not be allowed to drive home, and someone needs to stay with them for 24 hours.   SURGICAL WAITING ROOM VISITATION Patients having surgery or a procedure may have no more than 2 support people in the waiting area - these visitors may rotate.   Children under the age of 5 must have an adult with them who is not the patient. If the patient needs to stay at the hospital during part of their recovery, the visitor guidelines for inpatient rooms apply. Pre-op nurse will coordinate an appropriate time for 1 support person to accompany patient in pre-op.  This support person may not rotate.   Please refer to [email or web address] for the visitor guidelines for Inpatients (after your surgery is over and you are in a regular room).    Special instructions:    Oral Hygiene is also important to reduce your risk of infection.  Remember - BRUSH YOUR TEETH THE MORNING OF SURGERY WITH YOUR REGULAR TOOTHPASTE   [place]- Preparing For Surgery  Before surgery, you can play an important role. Because skin is not sterile, your skin needs to be as free of germs as possible. You can reduce the number of germs on your skin by washing with CHG (chlorahexidine gluconate) Soap before surgery.  CHG is an antiseptic cleaner which kills germs and bonds with the skin to continue killing germs even after washing.     Please do not use if you have an allergy to CHG or  antibacterial soaps. If your skin becomes reddened/irritated stop using the CHG.  Do not shave (including legs and underarms) for at least 48 hours prior to first CHG shower. It is OK to shave your face.  Please follow these instructions carefully.     Shower the NIGHT BEFORE SURGERY and the MORNING OF SURGERY with CHG Soap.   If you chose to wash your hair, wash your hair first as usual with your normal shampoo. After you shampoo, rinse your hair and body thoroughly to remove the shampoo.  Then [employer] and genitals (private parts) with your normal soap and rinse thoroughly to remove soap.  After that Use CHG Soap as you would any other liquid soap. You can apply CHG directly to the skin and wash gently with a scrungie or a clean washcloth.   Apply the CHG Soap to your body ONLY FROM THE NECK DOWN.  Do not use on open wounds or open sores. Avoid contact with your  eyes, ears, mouth and genitals (private parts). Wash Face and genitals (private parts)  with your normal soap.   Wash thoroughly, paying special attention to the area where your surgery will be performed.  Thoroughly rinse your body with warm water from the neck down.  DO NOT shower/wash with your normal soap after using and rinsing off the CHG Soap.  Pat yourself dry with a CLEAN TOWEL.  Wear CLEAN PAJAMAS to bed the night before surgery  Place CLEAN SHEETS on your bed the night before your surgery  DO NOT SLEEP WITH PETS.   Day of Surgery: Take a shower with CHG soap. Wear Clean/Comfortable clothing the morning of surgery Do not apply any deodorants/lotions.   Remember to brush your teeth WITH YOUR REGULAR TOOTHPASTE.    If you received a COVID test during your pre-op visit, it is requested that you wear a mask when out in public, stay away from anyone that may not be feeling well, and notify your surgeon if you develop symptoms. If you have been in contact with anyone that has tested positive in the last 10 days,  please notify your surgeon.    Please read over the following fact sheets that you were given.

## 2022-10-03 ENCOUNTER — Encounter (HOSPITAL_COMMUNITY)
Admission: RE | Admit: 2022-10-03 | Discharge: 2022-10-03 | Disposition: A | Discharge status: Home / Self Care | Payer: BC Managed Care – PPO | Source: Ambulatory Visit | Attending: Thoracic Surgery (Cardiothoracic Vascular Surgery) | Admitting: Thoracic Surgery (Cardiothoracic Vascular Surgery)

## 2022-10-03 ENCOUNTER — Other Ambulatory Visit: Payer: Self-pay

## 2022-10-03 ENCOUNTER — Ambulatory Visit (HOSPITAL_COMMUNITY)
Admission: RE | Admit: 2022-10-03 | Discharge: 2022-10-03 | Disposition: A | Discharge status: Home / Self Care | Payer: BC Managed Care – PPO | Source: Ambulatory Visit | Attending: Thoracic Surgery (Cardiothoracic Vascular Surgery) | Admitting: Thoracic Surgery (Cardiothoracic Vascular Surgery)

## 2022-10-03 ENCOUNTER — Encounter (HOSPITAL_COMMUNITY): Payer: Self-pay

## 2022-10-03 VITALS — BP 121/79 | Temp 97.5°F | Resp 20 | Ht 68.0 in | Wt 291.3 lb

## 2022-10-03 DIAGNOSIS — Z1152 Encounter for screening for COVID-19: Secondary | ICD-10-CM | POA: Insufficient documentation

## 2022-10-03 DIAGNOSIS — J9859 Other diseases of mediastinum, not elsewhere classified: Secondary | ICD-10-CM | POA: Diagnosis not present

## 2022-10-03 DIAGNOSIS — Z01818 Encounter for other preprocedural examination: Secondary | ICD-10-CM | POA: Insufficient documentation

## 2022-10-03 LAB — CBC
HCT: 41.7 % (ref 39.0–52.0)
Hemoglobin: 13.5 g/dL (ref 13.0–17.0)
MCH: 30.5 pg (ref 26.0–34.0)
MCHC: 32.4 g/dL (ref 30.0–36.0)
MCV: 94.3 fL (ref 80.0–100.0)
Platelets: 326 10*3/uL (ref 150–400)
RBC: 4.42 MIL/uL (ref 4.22–5.81)
RDW: 13.2 % (ref 11.5–15.5)
WBC: 5.4 10*3/uL (ref 4.0–10.5)
nRBC: 0 % (ref 0.0–0.2)

## 2022-10-03 LAB — URINALYSIS, ROUTINE W REFLEX MICROSCOPIC
Bilirubin Urine: NEGATIVE
Glucose, UA: NEGATIVE mg/dL
Hgb urine dipstick: NEGATIVE
Ketones, ur: NEGATIVE mg/dL
Leukocytes,Ua: NEGATIVE
Nitrite: NEGATIVE
Protein, ur: NEGATIVE mg/dL
Specific Gravity, Urine: 1.024 (ref 1.005–1.030)
pH: 5 (ref 5.0–8.0)

## 2022-10-03 LAB — COMPREHENSIVE METABOLIC PANEL
ALT: 23 U/L (ref 0–44)
AST: 22 U/L (ref 15–41)
Albumin: 3.5 g/dL (ref 3.5–5.0)
Alkaline Phosphatase: 82 U/L (ref 38–126)
Anion gap: 6 (ref 5–15)
BUN: 7 mg/dL (ref 6–20)
CO2: 24 mmol/L (ref 22–32)
Calcium: 8.7 mg/dL — ABNORMAL LOW (ref 8.9–10.3)
Chloride: 106 mmol/L (ref 98–111)
Creatinine, Ser: 0.95 mg/dL (ref 0.61–1.24)
GFR, Estimated: >60 mL/min (ref >60)
Glucose, Bld: 101 mg/dL — ABNORMAL HIGH (ref 70–99)
Potassium: 3.9 mmol/L (ref 3.5–5.1)
Sodium: 136 mmol/L (ref 135–145)
Total Bilirubin: 0.6 mg/dL (ref 0.3–1.2)
Total Protein: 6.8 g/dL (ref 6.5–8.1)

## 2022-10-03 LAB — PROTIME-INR
INR: 1 (ref 0.8–1.2)
Prothrombin Time: 12.7 seconds (ref 11.4–15.2)

## 2022-10-03 LAB — SURGICAL PCR SCREEN
MRSA, PCR: NEGATIVE
Staphylococcus aureus: NEGATIVE

## 2022-10-03 LAB — GLUCOSE, CAPILLARY: Glucose-Capillary: 92 mg/dL (ref 70–99)

## 2022-10-03 LAB — APTT: aPTT: 27 seconds (ref 24–36)

## 2022-10-03 NOTE — Progress Notes (Signed)
PCP - Selena Batten, MD Cardiologist - denies  PPM/ICD - denies Device Orders - n/a Rep Notified - n/a  Chest x-ray - 10/03/22 EKG - 10/03/22 Stress Test - denies ECHO - 12/08/14 Cardiac Cath - denies  Sleep Study - denies CPAP - n/a  Fasting Blood Sugar - patient is not checking CBG at home CBG today - 92 A1C - done in PAT on 10/03/22  Last dose of GLP1 agonist-  n/a   Blood Thinner Instructions: n/a  Aspirin Instructions: Patient was instructed: As of today, STOP taking any Aspirin (unless otherwise instructed by your surgeon) Aleve, Naproxen, Ibuprofen, Motrin, Advil, Goody's, BC's, all herbal medications, fish oil, and all vitamins.    ERAS Protcol - n/a  COVID TEST- patient states that he had a positive COVID test on the last week of October at [facility]. Patient was instructed to have the test result the day of surgery. Patient verbalized understanding.    Anesthesia review: no  Patient denies shortness of breath, fever, cough and chest pain at PAT appointment   All instructions explained to the patient, with a verbal understanding of the material. Patient agrees to go over the instructions while at home for a better understanding. Patient also instructed to self quarantine after being tested for COVID-19. The opportunity to ask questions was provided.

## 2022-10-04 LAB — HEMOGLOBIN A1C
Hgb A1c MFr Bld: 6.9 % — ABNORMAL HIGH (ref 4.8–5.6)
Mean Plasma Glucose: 151 mg/dL

## 2022-10-05 IMAGING — CT CT CARDIAC CORONARY ARTERY CALCIUM SCORE
2 of 3 series · 12 of 20 positions shown, 15 images · non-contrast
Comparison: Chest x-ray of April 08, 2022.

CLINICAL DATA: A 61-year-old African American male presents for
evaluation of coronary calcium in the setting of high cholesterol.

EXAM:
CT CARDIAC CORONARY ARTERY CALCIUM SCORE
TECHNIQUE: Non-contrast imaging through the heart was performed using
prospective ECG gating. Image post processing was performed on an
independent workstation, allowing for quantitative analysis of the
heart and coronary arteries. Note that this exam targets the heart
and the chest was not imaged in its entirety.

[Series 2: cascseq 2.0 d10f 70% · axial · 0.37mm/px · z∈[-118,-34]mm · 3 of 86 slices shown]
[im 22/86  vessel]
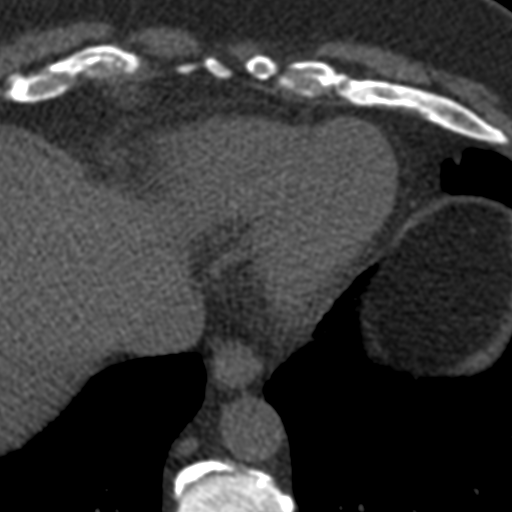
[im 43/86  vessel]
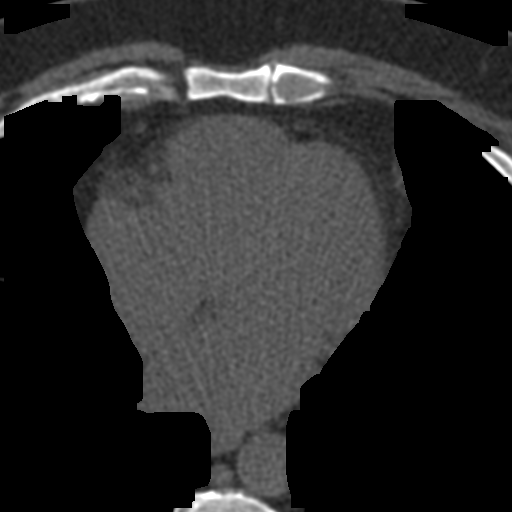
[im 64/86  vessel]
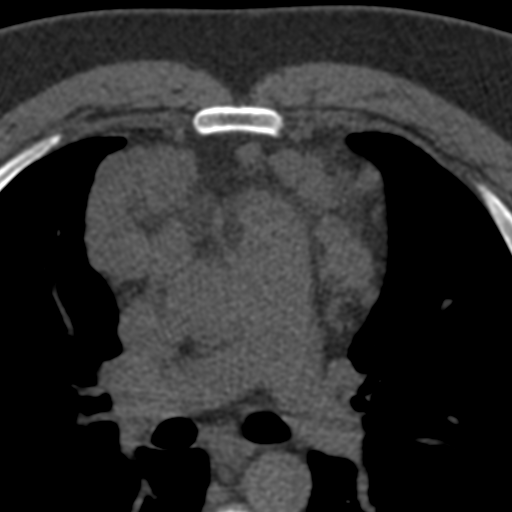

[Series 3: thin sfov · axial · 0.37mm/px · z∈[-144,-8]mm · 9 of 172 slices shown, 12 images]
[im 18/172  vessel]
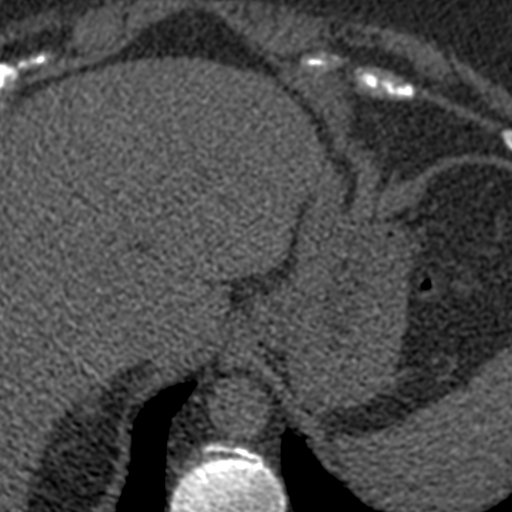
[im 18/172  lung]
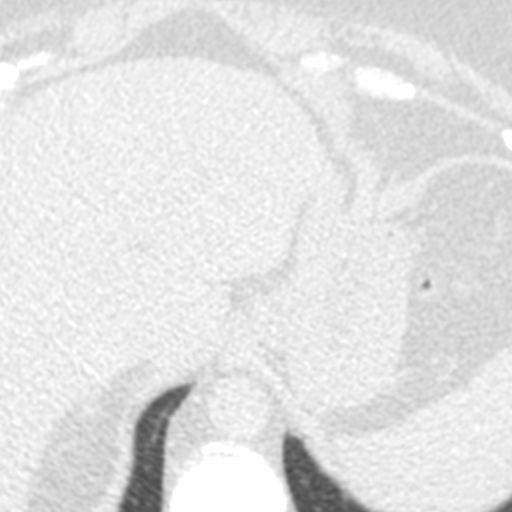
[im 35/172  vessel]
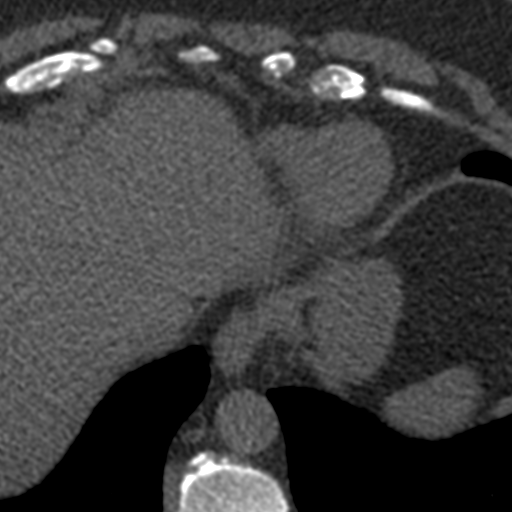
[im 52/172  vessel]
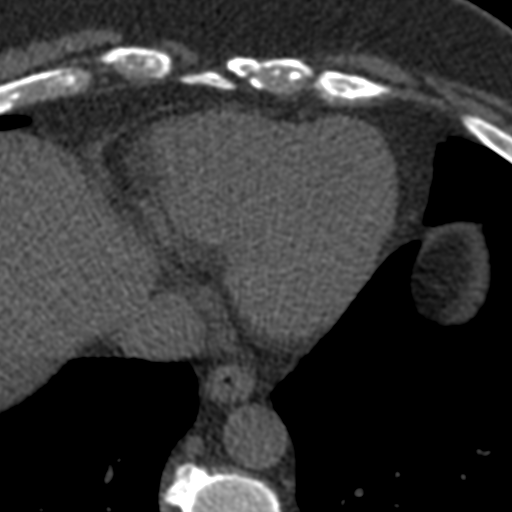
[im 69/172  vessel]
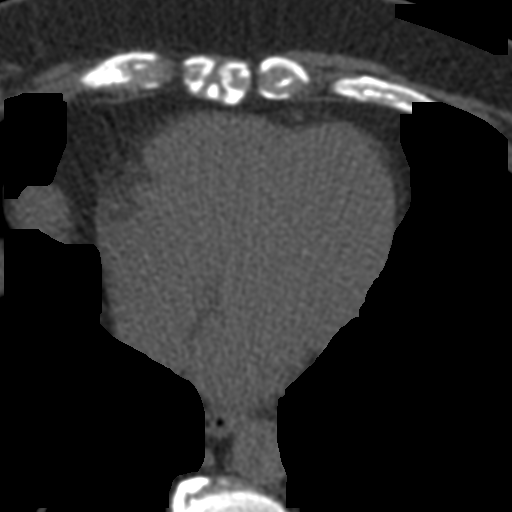
[im 86/172  vessel]
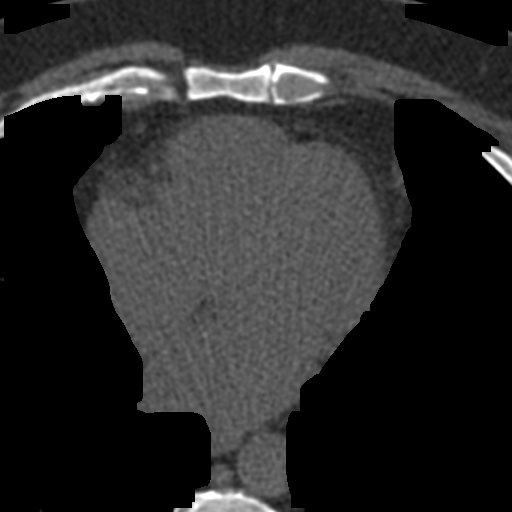
[im 86/172  lung]
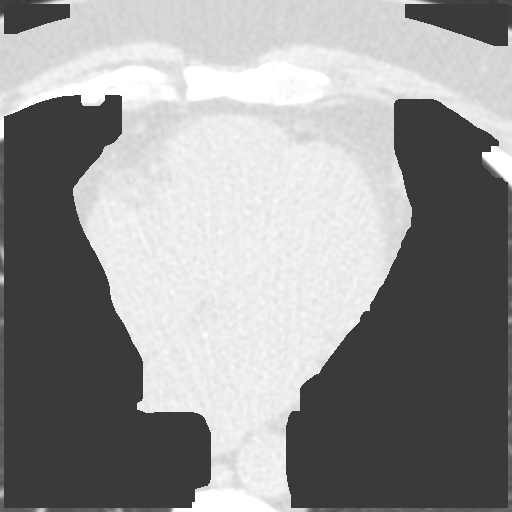
[im 103/172  vessel]
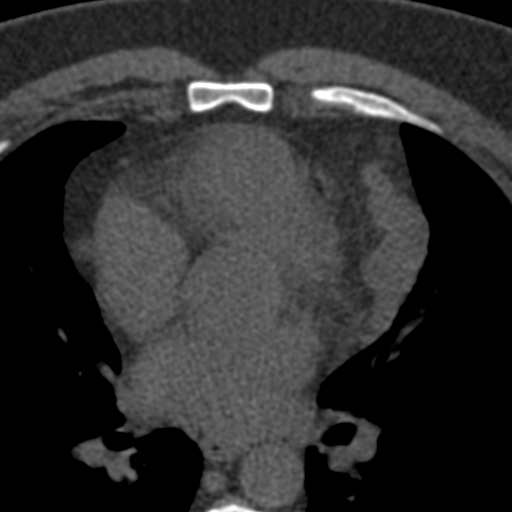
[im 120/172  vessel]
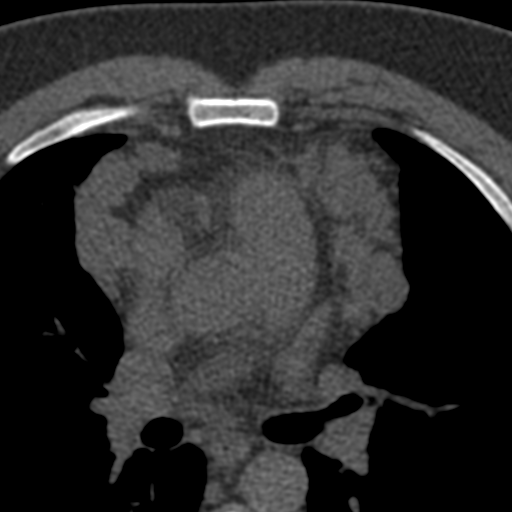
[im 137/172  vessel]
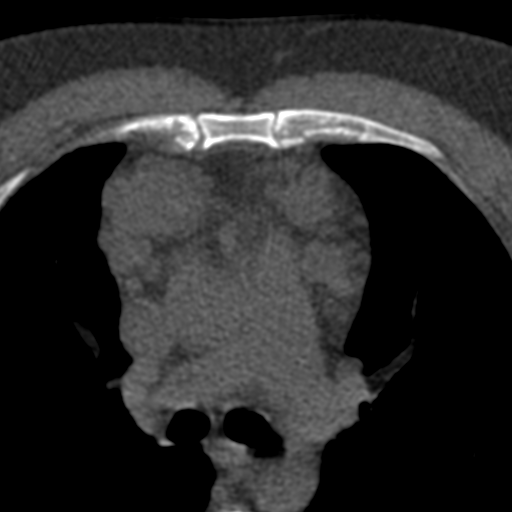
[im 154/172  vessel]
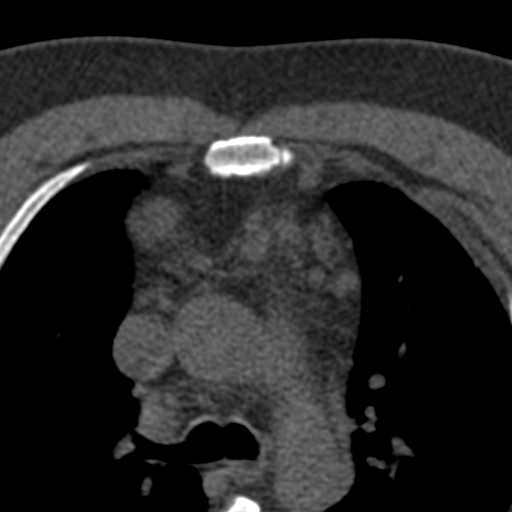
[im 154/172  lung]
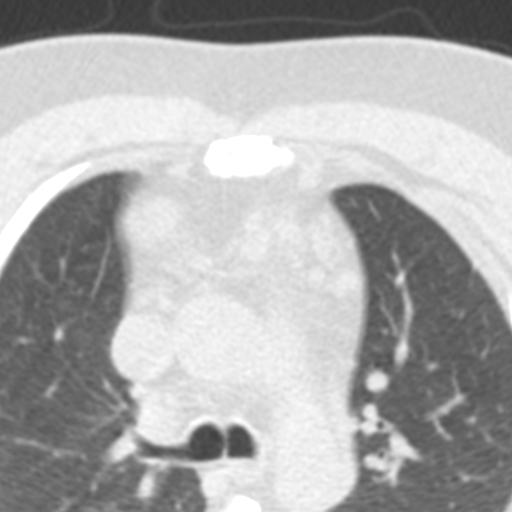

[12 of 20 positions shown; findings below may reference images not displayed]

FINDINGS: CORONARY CALCIUM SCORES:

Left Main: 0

LAD:

LCx: 0

RCA:

Total Agatston Score:

[HOSPITAL] percentile: 25

AORTA MEASUREMENTS:

Ascending Aorta: 30 mm

Descending Aorta: 24 mm

OTHER FINDINGS:

Cardiovascular: Heart great vessels grossly normal though with soft
tissue in the anterior mediastinum. See below.

Mediastinum/Nodes: Nodular soft tissue and masslike areas in the
anterior mediastinum, in the RIGHT anterior mediastinum bulging of
mediastinal contours measuring 5.0 x 4.3 cm. Multiple soft tissue
nodules throughout the chest. Along the LEFT mediastinal border in
the anterior mediastinum 6.8 x 1.6 cm area of near confluent soft
tissue. Multiple smaller nodules in the anterior mediastinum. No
adenopathy visible elsewhere in the imaged portions of the chest.
Esophagus grossly normal.

Lungs/Pleura: No effusion or consolidation. Nonspecific 2 mm RIGHT
upper lobe pulmonary nodule. (Image [DATE]).

Upper Abdomen: Incidental imaging of upper abdominal contents with
signs of hepatic steatosis, mild-to-moderate. No acute abnormality
on limited imaging of the upper abdomen.

Musculoskeletal: No acute bone finding. No destructive bone process.
Spinal degenerative changes.
IMPRESSION: 1. Coronary artery calcium score of 4.31. This places the patient in
the 25th percentile, for patient's of a similar age, race, gender
and ethnicity.
2. Nodular soft tissue and masslike areas in the anterior
mediastinum suspicious for neoplasm perhaps thymic in origin.
Lymphoma could also have this appearance though there is no
adenopathy in the chest, consider dedicated imaging of the chest
with contrasted CT or MRI with and without contrast.
3. Nonspecific 2 mm RIGHT upper lobe pulmonary nodule.
4. Hepatic steatosis.

These results will be called to the ordering clinician or
representative by the Radiologist Assistant, and communication
documented in the PACS or [REDACTED].

## 2022-10-06 NOTE — Anesthesia Preprocedure Evaluation (Addendum)
Anesthesia Evaluation  Patient identified by MRN, date of birth, ID band Patient awake    Reviewed: Allergy & Precautions, NPO status , Patient's Chart, lab work & pertinent test results  Airway Mallampati: II  TM Distance: >3 FB Neck ROM: Full    Dental  (+) Dental Advisory Given, Teeth Intact   Pulmonary asthma , sleep apnea    Pulmonary exam normal breath sounds clear to auscultation       Cardiovascular  Rhythm:Regular Rate:Normal  Echo 2016 - Left ventricle: The cavity size was normal. There was mild concentric hypertrophy. Systolic function was normal. The estimated ejection fraction was in the range of 60% to 65%. Wall motion was normal; there were no regional wall motion abnormalities. Left ventricular diastolic function parameters were normal. - Aortic valve: There was no significant regurgitation.  - Aortic root: The aortic root was normal in size.  - Ascending aorta: The ascending aorta was normal in size.  - Mitral valve: Structurally normal valve.  - Left atrium: The atrium was normal in size.  - Right ventricle: Systolic pressure was within the normal range.  - Right atrium: The atrium was normal in size.  - Atrial septum: No defect or patent foramen ovale was identified.  - Tricuspid valve: There was mild regurgitation.  - Pulmonary arteries: The main pulmonary artery was normal-sized.  - Inferior vena cava: The vessel was normal in size. The respirophasic diameter changes were in the normal range (= 50%), consistent with normal central venous pressure.  - Pericardium, extracardiac: The pericardium was normal in  appearance.    Impressions:  - Mild tricuspid regurgitation, otherwise normal study.    Neuro/Psych negative neurological ROS     GI/Hepatic negative GI ROS, Neg liver ROS,,,  Endo/Other  diabetes  Morbid obesity  Renal/GU negative Renal ROS     Musculoskeletal negative musculoskeletal ROS (+)     Abdominal  (+) + obese  Peds  Hematology negative hematology ROS (+)   Anesthesia Other Findings   Reproductive/Obstetrics                             Anesthesia Physical Anesthesia Plan  ASA: 3  Anesthesia Plan: General   Post-op Pain Management: Tylenol PO (pre-op)* and Gabapentin PO (pre-op)*   Induction: Intravenous  PONV Risk Score and Plan: 3 and Ondansetron, Dexamethasone, Treatment may vary due to age or medical condition and Midazolam  Airway Management Planned: Oral ETT  Additional Equipment: Arterial line  Intra-op Plan:   Post-operative Plan: Extubation in OR  Informed Consent: I have reviewed the patients History and Physical, chart, labs and discussed the procedure including the risks, benefits and alternatives for the proposed anesthesia with the patient or authorized representative who has indicated his/her understanding and acceptance.     Dental advisory given  Plan Discussed with: CRNA  Anesthesia Plan Comments: ( 2 x PIV, +/- CVL)       Anesthesia Quick Evaluation

## 2022-10-07 ENCOUNTER — Inpatient Hospital Stay (HOSPITAL_COMMUNITY): Payer: BC Managed Care – PPO

## 2022-10-07 ENCOUNTER — Encounter (HOSPITAL_COMMUNITY)
Admission: RE | Disposition: A | Discharge status: Home Health | Payer: Self-pay | Source: Home / Self Care | Attending: Thoracic Surgery (Cardiothoracic Vascular Surgery)

## 2022-10-07 ENCOUNTER — Inpatient Hospital Stay (HOSPITAL_COMMUNITY)
Admission: RE | Admit: 2022-10-07 | Discharge: 2022-10-22 | DRG: 987 | Disposition: A | Discharge status: Home Health | Payer: BC Managed Care – PPO | Attending: Thoracic Surgery (Cardiothoracic Vascular Surgery) | Admitting: Thoracic Surgery (Cardiothoracic Vascular Surgery)

## 2022-10-07 ENCOUNTER — Inpatient Hospital Stay (HOSPITAL_COMMUNITY): Payer: BC Managed Care – PPO | Admitting: Certified Registered Nurse Anesthetist

## 2022-10-07 ENCOUNTER — Encounter (HOSPITAL_COMMUNITY): Payer: Self-pay | Admitting: Thoracic Surgery (Cardiothoracic Vascular Surgery)

## 2022-10-07 ENCOUNTER — Other Ambulatory Visit: Payer: Self-pay

## 2022-10-07 DIAGNOSIS — E1165 Type 2 diabetes mellitus with hyperglycemia: Secondary | ICD-10-CM | POA: Diagnosis present

## 2022-10-07 DIAGNOSIS — I152 Hypertension secondary to endocrine disorders: Secondary | ICD-10-CM | POA: Diagnosis not present

## 2022-10-07 DIAGNOSIS — G4730 Sleep apnea, unspecified: Secondary | ICD-10-CM | POA: Diagnosis not present

## 2022-10-07 DIAGNOSIS — F32A Depression, unspecified: Secondary | ICD-10-CM | POA: Diagnosis present

## 2022-10-07 DIAGNOSIS — I700 Atherosclerosis of aorta: Secondary | ICD-10-CM | POA: Diagnosis not present

## 2022-10-07 DIAGNOSIS — E785 Hyperlipidemia, unspecified: Secondary | ICD-10-CM | POA: Diagnosis present

## 2022-10-07 DIAGNOSIS — J9600 Acute respiratory failure, unspecified whether with hypoxia or hypercapnia: Secondary | ICD-10-CM

## 2022-10-07 DIAGNOSIS — J45909 Unspecified asthma, uncomplicated: Secondary | ICD-10-CM | POA: Diagnosis not present

## 2022-10-07 DIAGNOSIS — G4733 Obstructive sleep apnea (adult) (pediatric): Secondary | ICD-10-CM | POA: Diagnosis present

## 2022-10-07 DIAGNOSIS — I4891 Unspecified atrial fibrillation: Secondary | ICD-10-CM | POA: Diagnosis not present

## 2022-10-07 DIAGNOSIS — F419 Anxiety disorder, unspecified: Secondary | ICD-10-CM | POA: Diagnosis present

## 2022-10-07 DIAGNOSIS — Z79899 Other long term (current) drug therapy: Secondary | ICD-10-CM

## 2022-10-07 DIAGNOSIS — N400 Enlarged prostate without lower urinary tract symptoms: Secondary | ICD-10-CM | POA: Diagnosis present

## 2022-10-07 DIAGNOSIS — J9601 Acute respiratory failure with hypoxia: Secondary | ICD-10-CM | POA: Diagnosis not present

## 2022-10-07 DIAGNOSIS — E119 Type 2 diabetes mellitus without complications: Secondary | ICD-10-CM | POA: Diagnosis not present

## 2022-10-07 DIAGNOSIS — J984 Other disorders of lung: Secondary | ICD-10-CM | POA: Diagnosis present

## 2022-10-07 DIAGNOSIS — R6510 Systemic inflammatory response syndrome (SIRS) of non-infectious origin without acute organ dysfunction: Secondary | ICD-10-CM | POA: Diagnosis not present

## 2022-10-07 DIAGNOSIS — Z6841 Body Mass Index (BMI) 40.0 and over, adult: Secondary | ICD-10-CM

## 2022-10-07 DIAGNOSIS — J982 Interstitial emphysema: Secondary | ICD-10-CM | POA: Diagnosis not present

## 2022-10-07 DIAGNOSIS — Z7984 Long term (current) use of oral hypoglycemic drugs: Secondary | ICD-10-CM

## 2022-10-07 DIAGNOSIS — E876 Hypokalemia: Secondary | ICD-10-CM | POA: Diagnosis not present

## 2022-10-07 DIAGNOSIS — I10 Essential (primary) hypertension: Secondary | ICD-10-CM | POA: Diagnosis present

## 2022-10-07 DIAGNOSIS — J90 Pleural effusion, not elsewhere classified: Secondary | ICD-10-CM | POA: Diagnosis not present

## 2022-10-07 DIAGNOSIS — I959 Hypotension, unspecified: Secondary | ICD-10-CM | POA: Diagnosis present

## 2022-10-07 DIAGNOSIS — Z825 Family history of asthma and other chronic lower respiratory diseases: Secondary | ICD-10-CM | POA: Diagnosis not present

## 2022-10-07 DIAGNOSIS — J9859 Other diseases of mediastinum, not elsewhere classified: Secondary | ICD-10-CM | POA: Diagnosis not present

## 2022-10-07 DIAGNOSIS — J811 Chronic pulmonary edema: Secondary | ICD-10-CM | POA: Diagnosis not present

## 2022-10-07 DIAGNOSIS — R222 Localized swelling, mass and lump, trunk: Secondary | ICD-10-CM | POA: Diagnosis not present

## 2022-10-07 DIAGNOSIS — R599 Enlarged lymph nodes, unspecified: Principal | ICD-10-CM | POA: Diagnosis present

## 2022-10-07 DIAGNOSIS — R0602 Shortness of breath: Secondary | ICD-10-CM | POA: Diagnosis not present

## 2022-10-07 DIAGNOSIS — G588 Other specified mononeuropathies: Secondary | ICD-10-CM | POA: Diagnosis present

## 2022-10-07 DIAGNOSIS — J810 Acute pulmonary edema: Secondary | ICD-10-CM | POA: Diagnosis not present

## 2022-10-07 DIAGNOSIS — Z794 Long term (current) use of insulin: Secondary | ICD-10-CM | POA: Diagnosis not present

## 2022-10-07 DIAGNOSIS — E320 Persistent hyperplasia of thymus: Secondary | ICD-10-CM | POA: Diagnosis not present

## 2022-10-07 DIAGNOSIS — J986 Disorders of diaphragm: Secondary | ICD-10-CM | POA: Diagnosis present

## 2022-10-07 DIAGNOSIS — Z7951 Long term (current) use of inhaled steroids: Secondary | ICD-10-CM

## 2022-10-07 DIAGNOSIS — J9610 Chronic respiratory failure, unspecified whether with hypoxia or hypercapnia: Secondary | ICD-10-CM | POA: Diagnosis not present

## 2022-10-07 DIAGNOSIS — E1159 Type 2 diabetes mellitus with other circulatory complications: Secondary | ICD-10-CM | POA: Diagnosis not present

## 2022-10-07 DIAGNOSIS — E669 Obesity, unspecified: Secondary | ICD-10-CM | POA: Diagnosis present

## 2022-10-07 DIAGNOSIS — Z8249 Family history of ischemic heart disease and other diseases of the circulatory system: Secondary | ICD-10-CM

## 2022-10-07 DIAGNOSIS — E328 Other diseases of thymus: Secondary | ICD-10-CM | POA: Diagnosis not present

## 2022-10-07 DIAGNOSIS — J439 Emphysema, unspecified: Secondary | ICD-10-CM | POA: Diagnosis not present

## 2022-10-07 DIAGNOSIS — J939 Pneumothorax, unspecified: Secondary | ICD-10-CM | POA: Diagnosis not present

## 2022-10-07 DIAGNOSIS — J9811 Atelectasis: Secondary | ICD-10-CM | POA: Diagnosis present

## 2022-10-07 HISTORY — PX: INTERCOSTAL NERVE BLOCK: SHX5021

## 2022-10-07 LAB — POCT I-STAT 7, (LYTES, BLD GAS, ICA,H+H)
Acid-base deficit: 6 mmol/L — ABNORMAL HIGH (ref 0.0–2.0)
Acid-base deficit: 7 mmol/L — ABNORMAL HIGH (ref 0.0–2.0)
Acid-base deficit: 6 mmol/L — ABNORMAL HIGH (ref 0.0–2.0)
Acid-base deficit: 6 mmol/L — ABNORMAL HIGH (ref 0.0–2.0)
Acid-base deficit: 6 mmol/L — ABNORMAL HIGH (ref 0.0–2.0)
Bicarbonate: 22.1 mmol/L (ref 20.0–28.0)
Bicarbonate: 19.9 mmol/L — ABNORMAL LOW (ref 20.0–28.0)
Bicarbonate: 20.4 mmol/L (ref 20.0–28.0)
Bicarbonate: 20.6 mmol/L (ref 20.0–28.0)
Bicarbonate: 20.9 mmol/L (ref 20.0–28.0)
Calcium, Ion: 1.18 mmol/L (ref 1.15–1.40)
Calcium, Ion: 1.13 mmol/L — ABNORMAL LOW (ref 1.15–1.40)
Calcium, Ion: 1.1 mmol/L — ABNORMAL LOW (ref 1.15–1.40)
Calcium, Ion: 1.16 mmol/L (ref 1.15–1.40)
Calcium, Ion: 1.12 mmol/L — ABNORMAL LOW (ref 1.15–1.40)
HCT: 36 % — ABNORMAL LOW (ref 39.0–52.0)
HCT: 33 % — ABNORMAL LOW (ref 39.0–52.0)
HCT: 31 % — ABNORMAL LOW (ref 39.0–52.0)
HCT: 32 % — ABNORMAL LOW (ref 39.0–52.0)
HCT: 35 % — ABNORMAL LOW (ref 39.0–52.0)
Hemoglobin: 12.2 g/dL — ABNORMAL LOW (ref 13.0–17.0)
Hemoglobin: 11.2 g/dL — ABNORMAL LOW (ref 13.0–17.0)
Hemoglobin: 10.5 g/dL — ABNORMAL LOW (ref 13.0–17.0)
Hemoglobin: 10.9 g/dL — ABNORMAL LOW (ref 13.0–17.0)
Hemoglobin: 11.9 g/dL — ABNORMAL LOW (ref 13.0–17.0)
O2 Saturation: 93 %
O2 Saturation: 100 %
O2 Saturation: 100 %
O2 Saturation: 100 %
O2 Saturation: 99 %
Patient temperature: 35.7
Potassium: 3.9 mmol/L (ref 3.5–5.1)
Potassium: 4.3 mmol/L (ref 3.5–5.1)
Potassium: 4.1 mmol/L (ref 3.5–5.1)
Potassium: 4.4 mmol/L (ref 3.5–5.1)
Potassium: 4.4 mmol/L (ref 3.5–5.1)
Sodium: 138 mmol/L (ref 135–145)
Sodium: 137 mmol/L (ref 135–145)
Sodium: 137 mmol/L (ref 135–145)
Sodium: 137 mmol/L (ref 135–145)
Sodium: 136 mmol/L (ref 135–145)
TCO2: 24 mmol/L (ref 22–32)
TCO2: 21 mmol/L — ABNORMAL LOW (ref 22–32)
TCO2: 22 mmol/L (ref 22–32)
TCO2: 22 mmol/L (ref 22–32)
TCO2: 22 mmol/L (ref 22–32)
pCO2 arterial: 56.8 mmHg — ABNORMAL HIGH (ref 32–48)
pCO2 arterial: 42.8 mmHg (ref 32–48)
pCO2 arterial: 42.4 mmHg (ref 32–48)
pCO2 arterial: 43 mmHg (ref 32–48)
pCO2 arterial: 44.1 mmHg (ref 32–48)
pH, Arterial: 7.199 — CL (ref 7.35–7.45)
pH, Arterial: 7.277 — ABNORMAL LOW (ref 7.35–7.45)
pH, Arterial: 7.284 — ABNORMAL LOW (ref 7.35–7.45)
pH, Arterial: 7.295 — ABNORMAL LOW (ref 7.35–7.45)
pH, Arterial: 7.277 — ABNORMAL LOW (ref 7.35–7.45)
pO2, Arterial: 84 mmHg (ref 83–108)
pO2, Arterial: 224 mmHg — ABNORMAL HIGH (ref 83–108)
pO2, Arterial: 234 mmHg — ABNORMAL HIGH (ref 83–108)
pO2, Arterial: 429 mmHg — ABNORMAL HIGH (ref 83–108)
pO2, Arterial: 170 mmHg — ABNORMAL HIGH (ref 83–108)

## 2022-10-07 LAB — BASIC METABOLIC PANEL
Anion gap: 8 (ref 5–15)
BUN: 10 mg/dL (ref 6–20)
CO2: 20 mmol/L — ABNORMAL LOW (ref 22–32)
Calcium: 7.7 mg/dL — ABNORMAL LOW (ref 8.9–10.3)
Chloride: 104 mmol/L (ref 98–111)
Creatinine, Ser: 0.95 mg/dL (ref 0.61–1.24)
GFR, Estimated: >60 mL/min (ref >60)
Glucose, Bld: 213 mg/dL — ABNORMAL HIGH (ref 70–99)
Potassium: 4.8 mmol/L (ref 3.5–5.1)
Sodium: 132 mmol/L — ABNORMAL LOW (ref 135–145)

## 2022-10-07 LAB — POCT I-STAT, CHEM 8
BUN: 11 mg/dL (ref 6–20)
Calcium, Ion: 1.13 mmol/L — ABNORMAL LOW (ref 1.15–1.40)
Chloride: 104 mmol/L (ref 98–111)
Creatinine, Ser: 0.7 mg/dL (ref 0.61–1.24)
Glucose, Bld: 204 mg/dL — ABNORMAL HIGH (ref 70–99)
HCT: 35 % — ABNORMAL LOW (ref 39.0–52.0)
Hemoglobin: 11.9 g/dL — ABNORMAL LOW (ref 13.0–17.0)
Potassium: 4.4 mmol/L (ref 3.5–5.1)
Sodium: 137 mmol/L (ref 135–145)
TCO2: 23 mmol/L (ref 22–32)

## 2022-10-07 LAB — CBC
HCT: 34.8 % — ABNORMAL LOW (ref 39.0–52.0)
HCT: 32.9 % — ABNORMAL LOW (ref 39.0–52.0)
Hemoglobin: 11.5 g/dL — ABNORMAL LOW (ref 13.0–17.0)
Hemoglobin: 11.2 g/dL — ABNORMAL LOW (ref 13.0–17.0)
MCH: 29.7 pg (ref 26.0–34.0)
MCH: 30.4 pg (ref 26.0–34.0)
MCHC: 33 g/dL (ref 30.0–36.0)
MCHC: 34 g/dL (ref 30.0–36.0)
MCV: 89.9 fL (ref 80.0–100.0)
MCV: 89.2 fL (ref 80.0–100.0)
Platelets: 231 10*3/uL (ref 150–400)
Platelets: 202 10*3/uL (ref 150–400)
RBC: 3.87 MIL/uL — ABNORMAL LOW (ref 4.22–5.81)
RBC: 3.69 MIL/uL — ABNORMAL LOW (ref 4.22–5.81)
RDW: 14.5 % (ref 11.5–15.5)
RDW: 14.5 % (ref 11.5–15.5)
WBC: 13.8 10*3/uL — ABNORMAL HIGH (ref 4.0–10.5)
WBC: 12 10*3/uL — ABNORMAL HIGH (ref 4.0–10.5)
nRBC: 0 % (ref 0.0–0.2)
nRBC: 0 % (ref 0.0–0.2)

## 2022-10-07 LAB — MAGNESIUM: Magnesium: 1.7 mg/dL (ref 1.7–2.4)

## 2022-10-07 LAB — APTT: aPTT: 24 seconds (ref 24–36)

## 2022-10-07 LAB — PREPARE RBC (CROSSMATCH)

## 2022-10-07 LAB — GLUCOSE, CAPILLARY
Glucose-Capillary: 124 mg/dL — ABNORMAL HIGH (ref 70–99)
Glucose-Capillary: 194 mg/dL — ABNORMAL HIGH (ref 70–99)
Glucose-Capillary: 195 mg/dL — ABNORMAL HIGH (ref 70–99)
Glucose-Capillary: 164 mg/dL — ABNORMAL HIGH (ref 70–99)

## 2022-10-07 LAB — ABO/RH: ABO/RH(D): O POS

## 2022-10-07 LAB — PROTIME-INR
INR: 1.2 (ref 0.8–1.2)
Prothrombin Time: 14.7 seconds (ref 11.4–15.2)

## 2022-10-07 SURGERY — EXCISION, MASS, MEDIASTINUM, ROBOT-ASSISTED
Anesthesia: General | Site: Chest | Laterality: Bilateral

## 2022-10-07 MED ORDER — PROPOFOL 10 MG/ML IV BOLUS
INTRAVENOUS | Status: DC | PRN
  Administered 2022-10-07: 150 mg via INTRAVENOUS
  Administered 2022-10-07: 50 mg via INTRAVENOUS

## 2022-10-07 MED ORDER — INSULIN ASPART 100 UNIT/ML IJ SOLN
0.0000 [IU] | INTRAMUSCULAR | Status: DC
  Administered 2022-10-07: 4 [IU] via SUBCUTANEOUS
  Administered 2022-10-08 (×2): 2 [IU] via SUBCUTANEOUS
  Administered 2022-10-08 (×2): 4 [IU] via SUBCUTANEOUS
  Administered 2022-10-08: 2 [IU] via SUBCUTANEOUS
  Administered 2022-10-08: 4 [IU] via SUBCUTANEOUS
  Administered 2022-10-08 – 2022-10-09 (×2): 2 [IU] via SUBCUTANEOUS
  Administered 2022-10-09: 4 [IU] via SUBCUTANEOUS
  Administered 2022-10-09: 2 [IU] via SUBCUTANEOUS
  Administered 2022-10-09: 4 [IU] via SUBCUTANEOUS
  Administered 2022-10-09 – 2022-10-10 (×7): 2 [IU] via SUBCUTANEOUS
  Administered 2022-10-11: 4 [IU] via SUBCUTANEOUS
  Administered 2022-10-11: 2 [IU] via SUBCUTANEOUS
  Administered 2022-10-11 (×3): 4 [IU] via SUBCUTANEOUS
  Administered 2022-10-11 – 2022-10-12 (×3): 2 [IU] via SUBCUTANEOUS

## 2022-10-07 MED ORDER — SODIUM CHLORIDE 0.9 % IV SOLN: INTRAVENOUS | Status: DC

## 2022-10-07 MED ORDER — ONDANSETRON HCL 4 MG/2ML IJ SOLN
INTRAMUSCULAR | Status: AC
  Filled 2022-10-07: qty 2

## 2022-10-07 MED ORDER — NOREPINEPHRINE 4 MG/250ML-% IV SOLN: 0.0000 ug/min | INTRAVENOUS | Status: DC

## 2022-10-07 MED ORDER — DOCUSATE SODIUM 100 MG PO CAPS
200.0000 mg | ORAL_CAPSULE | Freq: Every day | ORAL | Status: DC
  Administered 2022-10-08: 200 mg via ORAL
  Filled 2022-10-07: qty 2

## 2022-10-07 MED ORDER — BUPIVACAINE LIPOSOME 1.3 % IJ SUSP
INTRAMUSCULAR | Status: DC | PRN
  Administered 2022-10-07: 100 mL

## 2022-10-07 MED ORDER — AEROCHAMBER PLUS FLO-VU LARGE MISC: 1.0000 | Freq: Once | Status: DC

## 2022-10-07 MED ORDER — SUCCINYLCHOLINE CHLORIDE 200 MG/10ML IV SOSY
PREFILLED_SYRINGE | INTRAVENOUS | Status: AC
  Filled 2022-10-07: qty 10

## 2022-10-07 MED ORDER — MORPHINE SULFATE (PF) 2 MG/ML IV SOLN
1.0000 mg | INTRAVENOUS | Status: DC | PRN
  Administered 2022-10-07: 2 mg via INTRAVENOUS
  Administered 2022-10-07 (×2): 4 mg via INTRAVENOUS
  Administered 2022-10-07: 2 mg via INTRAVENOUS
  Administered 2022-10-08 (×2): 4 mg via INTRAVENOUS
  Administered 2022-10-08: 2 mg via INTRAVENOUS
  Administered 2022-10-08 – 2022-10-10 (×9): 4 mg via INTRAVENOUS
  Administered 2022-10-10: 2 mg via INTRAVENOUS
  Administered 2022-10-11: 4 mg via INTRAVENOUS
  Administered 2022-10-11 (×2): 2 mg via INTRAVENOUS
  Administered 2022-10-11 (×2): 4 mg via INTRAVENOUS
  Administered 2022-10-11 – 2022-10-12 (×5): 2 mg via INTRAVENOUS
  Administered 2022-10-18: 1 mg via INTRAVENOUS
  Filled 2022-10-07: qty 2
  Filled 2022-10-07 (×4): qty 1
  Filled 2022-10-07: qty 2
  Filled 2022-10-07: qty 1
  Filled 2022-10-07 (×3): qty 2
  Filled 2022-10-07 (×3): qty 1
  Filled 2022-10-07 (×5): qty 2
  Filled 2022-10-07: qty 1
  Filled 2022-10-07 (×3): qty 2
  Filled 2022-10-07: qty 1
  Filled 2022-10-07 (×3): qty 2
  Filled 2022-10-07: qty 1
  Filled 2022-10-07 (×3): qty 2

## 2022-10-07 MED ORDER — CHLORHEXIDINE GLUCONATE 0.12 % MT SOLN
15.0000 mL | OROMUCOSAL | Status: AC
  Administered 2022-10-07: 15 mL via OROMUCOSAL
  Filled 2022-10-07: qty 15

## 2022-10-07 MED ORDER — HEMOSTATIC AGENTS (NO CHARGE) OPTIME
TOPICAL | Status: DC | PRN
  Administered 2022-10-07 (×3): 1 via TOPICAL

## 2022-10-07 MED ORDER — VASOPRESSIN 20 UNIT/ML IV SOLN
INTRAVENOUS | Status: AC
  Filled 2022-10-07: qty 1

## 2022-10-07 MED ORDER — CEFAZOLIN SODIUM 1 G IJ SOLR
INTRAMUSCULAR | Status: AC
  Filled 2022-10-07: qty 30

## 2022-10-07 MED ORDER — CHLORHEXIDINE GLUCONATE 0.12 % MT SOLN
OROMUCOSAL | Status: AC
  Administered 2022-10-07: 15 mL
  Filled 2022-10-07: qty 15

## 2022-10-07 MED ORDER — CEFAZOLIN SODIUM-DEXTROSE 2-4 GM/100ML-% IV SOLN
2.0000 g | Freq: Three times a day (TID) | INTRAVENOUS | Status: AC
  Administered 2022-10-07 – 2022-10-09 (×6): 2 g via INTRAVENOUS
  Filled 2022-10-07 (×6): qty 100

## 2022-10-07 MED ORDER — INSULIN REGULAR(HUMAN) IN NACL 100-0.9 UT/100ML-% IV SOLN
INTRAVENOUS | Status: DC
  Filled 2022-10-07: qty 100

## 2022-10-07 MED ORDER — DEXAMETHASONE SODIUM PHOSPHATE 10 MG/ML IJ SOLN
INTRAMUSCULAR | Status: AC
  Filled 2022-10-07: qty 1

## 2022-10-07 MED ORDER — SODIUM CHLORIDE 0.9 % IV SOLN
10.0000 mL/h | Freq: Once | INTRAVENOUS | Status: AC
  Administered 2022-10-07: 10 mL/h via INTRAVENOUS

## 2022-10-07 MED ORDER — MIDAZOLAM HCL 2 MG/2ML IJ SOLN
INTRAMUSCULAR | Status: DC | PRN
  Administered 2022-10-07 (×2): 1 mg via INTRAVENOUS

## 2022-10-07 MED ORDER — ACETAMINOPHEN 500 MG PO TABS
1000.0000 mg | ORAL_TABLET | Freq: Four times a day (QID) | ORAL | Status: AC
  Administered 2022-10-08 – 2022-10-12 (×10): 1000 mg via ORAL
  Filled 2022-10-07 (×12): qty 2

## 2022-10-07 MED ORDER — PHENYLEPHRINE HCL-NACL 20-0.9 MG/250ML-% IV SOLN
INTRAVENOUS | Status: DC | PRN
  Administered 2022-10-07: 25 ug/min via INTRAVENOUS

## 2022-10-07 MED ORDER — ACETAMINOPHEN 650 MG RE SUPP: 650.0000 mg | Freq: Once | RECTAL | Status: DC

## 2022-10-07 MED ORDER — CEFAZOLIN IN SODIUM CHLORIDE 3-0.9 GM/100ML-% IV SOLN
3.0000 g | INTRAVENOUS | Status: AC
  Administered 2022-10-07 (×2): 3 g via INTRAVENOUS
  Filled 2022-10-07: qty 100

## 2022-10-07 MED ORDER — ROCURONIUM BROMIDE 10 MG/ML (PF) SYRINGE
PREFILLED_SYRINGE | INTRAVENOUS | Status: AC
  Filled 2022-10-07: qty 20

## 2022-10-07 MED ORDER — ASPIRIN 325 MG PO TBEC
325.0000 mg | DELAYED_RELEASE_TABLET | Freq: Every day | ORAL | Status: DC
  Administered 2022-10-08 – 2022-10-22 (×15): 325 mg via ORAL
  Filled 2022-10-07 (×15): qty 1

## 2022-10-07 MED ORDER — BUPIVACAINE HCL (PF) 0.5 % IJ SOLN
INTRAMUSCULAR | Status: AC
  Filled 2022-10-07: qty 30

## 2022-10-07 MED ORDER — LACTATED RINGERS IV SOLN: INTRAVENOUS | Status: DC

## 2022-10-07 MED ORDER — MIDAZOLAM HCL 2 MG/2ML IJ SOLN: 2.0000 mg | INTRAMUSCULAR | Status: DC | PRN

## 2022-10-07 MED ORDER — ALBUTEROL SULFATE (2.5 MG/3ML) 0.083% IN NEBU
2.5000 mg | INHALATION_SOLUTION | Freq: Four times a day (QID) | RESPIRATORY_TRACT | Status: DC | PRN
  Administered 2022-10-14: 2.5 mg via RESPIRATORY_TRACT
  Filled 2022-10-07: qty 3

## 2022-10-07 MED ORDER — NITROGLYCERIN IN D5W 200-5 MCG/ML-% IV SOLN: 0.0000 ug/min | INTRAVENOUS | Status: DC

## 2022-10-07 MED ORDER — ACETAMINOPHEN 500 MG PO TABS
1000.0000 mg | ORAL_TABLET | Freq: Once | ORAL | Status: AC
  Administered 2022-10-07: 1000 mg via ORAL
  Filled 2022-10-07: qty 2

## 2022-10-07 MED ORDER — FENTANYL CITRATE (PF) 250 MCG/5ML IJ SOLN
INTRAMUSCULAR | Status: AC
  Filled 2022-10-07: qty 5

## 2022-10-07 MED ORDER — PHENYLEPHRINE HCL-NACL 20-0.9 MG/250ML-% IV SOLN
0.0000 ug/min | INTRAVENOUS | Status: DC
  Administered 2022-10-07: 25 ug/min via INTRAVENOUS

## 2022-10-07 MED ORDER — LIDOCAINE 2% (20 MG/ML) 5 ML SYRINGE
INTRAMUSCULAR | Status: DC | PRN
  Administered 2022-10-07: 100 mg via INTRAVENOUS

## 2022-10-07 MED ORDER — BISACODYL 5 MG PO TBEC
10.0000 mg | DELAYED_RELEASE_TABLET | Freq: Every day | ORAL | Status: DC
  Administered 2022-10-08 – 2022-10-17 (×8): 10 mg via ORAL
  Filled 2022-10-07 (×14): qty 2

## 2022-10-07 MED ORDER — ALBUMIN HUMAN 5 % IV SOLN
250.0000 mL | INTRAVENOUS | Status: AC | PRN
  Administered 2022-10-07: 12.5 g via INTRAVENOUS
  Filled 2022-10-07: qty 250

## 2022-10-07 MED ORDER — VANCOMYCIN HCL IN DEXTROSE 1-5 GM/200ML-% IV SOLN
1000.0000 mg | Freq: Once | INTRAVENOUS | Status: AC
  Administered 2022-10-07: 1000 mg via INTRAVENOUS
  Filled 2022-10-07: qty 200

## 2022-10-07 MED ORDER — TRAMADOL HCL 50 MG PO TABS
50.0000 mg | ORAL_TABLET | ORAL | Status: DC | PRN
  Administered 2022-10-08: 50 mg via ORAL
  Administered 2022-10-09 – 2022-10-13 (×4): 100 mg via ORAL
  Administered 2022-10-16 (×2): 50 mg via ORAL
  Administered 2022-10-20 – 2022-10-22 (×6): 100 mg via ORAL
  Filled 2022-10-07: qty 2
  Filled 2022-10-07: qty 1
  Filled 2022-10-07 (×10): qty 2
  Filled 2022-10-07 (×2): qty 1

## 2022-10-07 MED ORDER — SODIUM CHLORIDE 0.9% FLUSH
3.0000 mL | Freq: Two times a day (BID) | INTRAVENOUS | Status: DC
  Administered 2022-10-08 – 2022-10-22 (×22): 3 mL via INTRAVENOUS

## 2022-10-07 MED ORDER — ACETAMINOPHEN 160 MG/5ML PO SOLN: 1000.0000 mg | Freq: Four times a day (QID) | ORAL | Status: DC

## 2022-10-07 MED ORDER — 0.9 % SODIUM CHLORIDE (POUR BTL) OPTIME
TOPICAL | Status: DC | PRN
  Administered 2022-10-07: 2000 mL

## 2022-10-07 MED ORDER — HEMOSTATIC AGENTS (NO CHARGE) OPTIME
TOPICAL | Status: DC | PRN
  Administered 2022-10-07 (×2): 1 via TOPICAL

## 2022-10-07 MED ORDER — METOPROLOL TARTRATE 5 MG/5ML IV SOLN
2.5000 mg | INTRAVENOUS | Status: DC | PRN
  Administered 2022-10-09: 2.5 mg via INTRAVENOUS
  Filled 2022-10-07: qty 5

## 2022-10-07 MED ORDER — ALBUTEROL SULFATE HFA 108 (90 BASE) MCG/ACT IN AERS: 2.0000 | INHALATION_SPRAY | RESPIRATORY_TRACT | Status: DC | PRN

## 2022-10-07 MED ORDER — PROPOFOL 500 MG/50ML IV EMUL
INTRAVENOUS | Status: DC | PRN
  Administered 2022-10-07: 50 ug/kg/min via INTRAVENOUS

## 2022-10-07 MED ORDER — SODIUM CHLORIDE 0.9% FLUSH
3.0000 mL | INTRAVENOUS | Status: DC | PRN
  Administered 2022-10-17: 3 mL via INTRAVENOUS

## 2022-10-07 MED ORDER — SODIUM CHLORIDE 0.45 % IV SOLN: INTRAVENOUS | Status: DC | PRN

## 2022-10-07 MED ORDER — SUGAMMADEX SODIUM 200 MG/2ML IV SOLN
INTRAVENOUS | Status: DC | PRN
  Administered 2022-10-07: 350 mg via INTRAVENOUS
  Administered 2022-10-07: 50 mg via INTRAVENOUS
  Administered 2022-10-07: 100 mg via INTRAVENOUS

## 2022-10-07 MED ORDER — ORAL CARE MOUTH RINSE: 15.0000 mL | OROMUCOSAL | Status: DC | PRN

## 2022-10-07 MED ORDER — FENTANYL CITRATE (PF) 250 MCG/5ML IJ SOLN
INTRAMUSCULAR | Status: DC | PRN
  Administered 2022-10-07: 150 ug via INTRAVENOUS
  Administered 2022-10-07: 100 ug via INTRAVENOUS
  Administered 2022-10-07 (×5): 50 ug via INTRAVENOUS

## 2022-10-07 MED ORDER — BUPIVACAINE LIPOSOME 1.3 % IJ SUSP
INTRAMUSCULAR | Status: AC
  Filled 2022-10-07: qty 20

## 2022-10-07 MED ORDER — ONDANSETRON HCL 4 MG/2ML IJ SOLN: 4.0000 mg | Freq: Four times a day (QID) | INTRAMUSCULAR | Status: DC | PRN

## 2022-10-07 MED ORDER — DEXMEDETOMIDINE HCL IN NACL 400 MCG/100ML IV SOLN
0.0000 ug/kg/h | INTRAVENOUS | Status: DC
  Administered 2022-10-07: 0.7 ug/kg/h via INTRAVENOUS
  Filled 2022-10-07: qty 100

## 2022-10-07 MED ORDER — LIDOCAINE 2% (20 MG/ML) 5 ML SYRINGE
INTRAMUSCULAR | Status: AC
  Filled 2022-10-07: qty 5

## 2022-10-07 MED ORDER — DEXTROSE 50 % IV SOLN: 0.0000 mL | INTRAVENOUS | Status: DC | PRN

## 2022-10-07 MED ORDER — ROCURONIUM BROMIDE 10 MG/ML (PF) SYRINGE
PREFILLED_SYRINGE | INTRAVENOUS | Status: DC | PRN
  Administered 2022-10-07: 30 mg via INTRAVENOUS
  Administered 2022-10-07 (×3): 40 mg via INTRAVENOUS
  Administered 2022-10-07: 70 mg via INTRAVENOUS
  Administered 2022-10-07: 40 mg via INTRAVENOUS

## 2022-10-07 MED ORDER — ALBUMIN HUMAN 5 % IV SOLN: INTRAVENOUS | Status: DC | PRN

## 2022-10-07 MED ORDER — ONDANSETRON HCL 4 MG/2ML IJ SOLN
INTRAMUSCULAR | Status: DC | PRN
  Administered 2022-10-07: 4 mg via INTRAVENOUS

## 2022-10-07 MED ORDER — LACTATED RINGERS IV SOLN: 500.0000 mL | Freq: Once | INTRAVENOUS | Status: DC | PRN

## 2022-10-07 MED ORDER — ASPIRIN 81 MG PO CHEW: 324.0000 mg | CHEWABLE_TABLET | Freq: Every day | ORAL | Status: DC

## 2022-10-07 MED ORDER — ACETAMINOPHEN 160 MG/5ML PO SOLN: 650.0000 mg | Freq: Once | ORAL | Status: DC

## 2022-10-07 MED ORDER — CHLORHEXIDINE GLUCONATE CLOTH 2 % EX PADS
6.0000 | MEDICATED_PAD | Freq: Every day | CUTANEOUS | Status: DC
  Administered 2022-10-07 – 2022-10-18 (×10): 6 via TOPICAL

## 2022-10-07 MED ORDER — ORAL CARE MOUTH RINSE
15.0000 mL | OROMUCOSAL | Status: DC
  Administered 2022-10-08 – 2022-10-10 (×3): 15 mL via OROMUCOSAL

## 2022-10-07 MED ORDER — PROPOFOL 10 MG/ML IV BOLUS
INTRAVENOUS | Status: AC
  Filled 2022-10-07: qty 20

## 2022-10-07 MED ORDER — ROSUVASTATIN CALCIUM 5 MG PO TABS
10.0000 mg | ORAL_TABLET | Freq: Every evening | ORAL | Status: DC
  Administered 2022-10-09 – 2022-10-21 (×12): 10 mg via ORAL
  Filled 2022-10-07 (×12): qty 2

## 2022-10-07 MED ORDER — SODIUM CHLORIDE 0.9 % IV SOLN: 250.0000 mL | INTRAVENOUS | Status: DC

## 2022-10-07 MED ORDER — BISACODYL 10 MG RE SUPP: 10.0000 mg | Freq: Every day | RECTAL | Status: DC

## 2022-10-07 MED ORDER — MIDAZOLAM HCL 2 MG/2ML IJ SOLN
INTRAMUSCULAR | Status: AC
  Filled 2022-10-07: qty 2

## 2022-10-07 MED ORDER — LACTATED RINGERS IV SOLN: INTRAVENOUS | Status: DC | PRN

## 2022-10-07 MED ORDER — OXYCODONE HCL 5 MG PO TABS
5.0000 mg | ORAL_TABLET | ORAL | Status: DC | PRN
  Administered 2022-10-08 – 2022-10-14 (×19): 10 mg via ORAL
  Administered 2022-10-15: 5 mg via ORAL
  Administered 2022-10-17 – 2022-10-19 (×6): 10 mg via ORAL
  Administered 2022-10-19: 5 mg via ORAL
  Administered 2022-10-20 – 2022-10-21 (×6): 10 mg via ORAL
  Filled 2022-10-07 (×12): qty 2
  Filled 2022-10-07: qty 1
  Filled 2022-10-07 (×11): qty 2
  Filled 2022-10-07: qty 1
  Filled 2022-10-07 (×10): qty 2

## 2022-10-07 MED ORDER — PHENYLEPHRINE 80 MCG/ML (10ML) SYRINGE FOR IV PUSH (FOR BLOOD PRESSURE SUPPORT)
PREFILLED_SYRINGE | INTRAVENOUS | Status: DC | PRN
  Administered 2022-10-07: 80 ug via INTRAVENOUS
  Administered 2022-10-07: 160 ug via INTRAVENOUS

## 2022-10-07 MED ORDER — VASOPRESSIN 20 UNIT/ML IV SOLN
INTRAVENOUS | Status: DC | PRN
  Administered 2022-10-07 (×2): 1 [IU] via INTRAVENOUS

## 2022-10-07 MED ORDER — PANTOPRAZOLE SODIUM 40 MG PO TBEC: 40.0000 mg | DELAYED_RELEASE_TABLET | Freq: Every day | ORAL | Status: DC

## 2022-10-07 MED ORDER — SODIUM CHLORIDE (PF) 0.9 % IJ SOLN
INTRAMUSCULAR | Status: AC
  Filled 2022-10-07: qty 20

## 2022-10-07 MED ORDER — FAMOTIDINE IN NACL 20-0.9 MG/50ML-% IV SOLN
20.0000 mg | Freq: Two times a day (BID) | INTRAVENOUS | Status: AC
  Administered 2022-10-07 (×2): 20 mg via INTRAVENOUS
  Filled 2022-10-07 (×2): qty 50

## 2022-10-07 MED ORDER — DEXAMETHASONE SODIUM PHOSPHATE 10 MG/ML IJ SOLN
INTRAMUSCULAR | Status: DC | PRN
  Administered 2022-10-07: 10 mg via INTRAVENOUS

## 2022-10-07 MED ORDER — PHENYLEPHRINE 80 MCG/ML (10ML) SYRINGE FOR IV PUSH (FOR BLOOD PRESSURE SUPPORT)
PREFILLED_SYRINGE | INTRAVENOUS | Status: AC
  Filled 2022-10-07: qty 10

## 2022-10-07 SURGICAL SUPPLY — 84 items
ADH SKN CLS APL DERMABOND .7 (GAUZE/BANDAGES/DRESSINGS) ×4
APL PRP STRL LF DISP 70% ISPRP (MISCELLANEOUS) ×4
BLADE STERNUM SYSTEM 6 (BLADE) IMPLANT
CHLORAPREP W/TINT 26 (MISCELLANEOUS) ×2 IMPLANT
CLIP LIGATING HEMO O LOK GREEN (MISCELLANEOUS) IMPLANT
CLIP TI MEDIUM 24 (CLIP) IMPLANT
CLIP TI MEDIUM 6 (CLIP) IMPLANT
CLIP TI WIDE RED SMALL 24 (CLIP) IMPLANT
CLIP TI WIDE RED SMALL 6 (CLIP) IMPLANT
CNTNR URN SCR LID CUP LEK RST (MISCELLANEOUS) ×8 IMPLANT
CONNECTOR BLAKE 2:1 CARIO BLK (MISCELLANEOUS) IMPLANT
CONT SPEC 4OZ STRL OR WHT (MISCELLANEOUS) ×8
COVER SURGICAL LIGHT HANDLE (MISCELLANEOUS) IMPLANT
DEFOGGER SCOPE WARMER CLEARIFY (MISCELLANEOUS) ×2 IMPLANT
DERMABOND ADVANCED .7 DNX12 (GAUZE/BANDAGES/DRESSINGS) ×2 IMPLANT
DRAIN CHANNEL 19F RND (DRAIN) IMPLANT
DRAPE ARM DVNC X/XI (DISPOSABLE) ×8 IMPLANT
DRAPE COLUMN DVNC XI (DISPOSABLE) ×2 IMPLANT
DRAPE CV SPLIT W-CLR ANES SCRN (DRAPES) ×2 IMPLANT
DRAPE DA VINCI XI ARM (DISPOSABLE) ×8
DRAPE DA VINCI XI COLUMN (DISPOSABLE) ×2
DRAPE INCISE IOBAN 66X45 STRL (DRAPES) IMPLANT
DRAPE ORTHO SPLIT 77X108 STRL (DRAPES) ×4
DRAPE SURG ORHT 6 SPLT 77X108 (DRAPES) ×2 IMPLANT
DRSG AQUACEL AG ADV 3.5X10 (GAUZE/BANDAGES/DRESSINGS) IMPLANT
DRSG AQUACEL AG ADV 3.5X14 (GAUZE/BANDAGES/DRESSINGS) IMPLANT
ELECT REM PT RETURN 9FT ADLT (ELECTROSURGICAL) ×2
ELECTRODE REM PT RTRN 9FT ADLT (ELECTROSURGICAL) ×2 IMPLANT
FELT TEFLON 1X6 (MISCELLANEOUS) IMPLANT
GAUZE 4X4 16PLY ~~LOC~~+RFID DBL (SPONGE) IMPLANT
GAUZE KITTNER 4X5 RF (MISCELLANEOUS) ×6 IMPLANT
GAUZE SPONGE 4X4 12PLY STRL (GAUZE/BANDAGES/DRESSINGS) IMPLANT
GAUZE SPONGE 4X4 12PLY STRL LF (GAUZE/BANDAGES/DRESSINGS) IMPLANT
GLOVE BIO SURGEON STRL SZ7.5 (GLOVE) ×6 IMPLANT
GOWN STRL REUS W/ TWL LRG LVL3 (GOWN DISPOSABLE) ×2 IMPLANT
GOWN STRL REUS W/ TWL XL LVL3 (GOWN DISPOSABLE) ×4 IMPLANT
GOWN STRL REUS W/TWL 2XL LVL3 (GOWN DISPOSABLE) ×2 IMPLANT
GOWN STRL REUS W/TWL LRG LVL3 (GOWN DISPOSABLE) ×8
GOWN STRL REUS W/TWL XL LVL3 (GOWN DISPOSABLE) ×8
HEMOSTAT POWDER SURGIFOAM 1G (HEMOSTASIS) IMPLANT
HEMOSTAT SURGICEL 2X14 (HEMOSTASIS) ×2 IMPLANT
IRRIGATOR SUCT 8 DISP DVNC XI (IRRIGATION / IRRIGATOR) IMPLANT
IRRIGATOR SUCTION 8MM XI DISP (IRRIGATION / IRRIGATOR) ×2
KIT SUCTION CATH 14FR (SUCTIONS) IMPLANT
NEEDLE HYPO 22GX1.5 SAFETY (NEEDLE) ×2 IMPLANT
PACK CHEST (CUSTOM PROCEDURE TRAY) ×2 IMPLANT
PAD ARMBOARD 7.5X6 YLW CONV (MISCELLANEOUS) ×4 IMPLANT
PAD ELECT DEFIB RADIOL ZOLL (MISCELLANEOUS) ×2 IMPLANT
SEAL CANN UNIV 5-8 DVNC XI (MISCELLANEOUS) ×8 IMPLANT
SEAL XI 5MM-8MM UNIVERSAL (MISCELLANEOUS) ×8
SEALER LIGASURE MARYLAND 30 (ELECTROSURGICAL) IMPLANT
SEALER SYNCHRO 8 IS4000 DV (MISCELLANEOUS) ×2
SEALER SYNCHRO 8 IS4000 DVNC (MISCELLANEOUS) IMPLANT
SET TRI-LUMEN FLTR TB AIRSEAL (TUBING) ×2 IMPLANT
SOLUTION ELECTROLUBE (MISCELLANEOUS) ×2 IMPLANT
SPONGE T-LAP 18X18 ~~LOC~~+RFID (SPONGE) ×2 IMPLANT
SPONGE TONSIL 1 RF SGL (DISPOSABLE) ×2 IMPLANT
SUT MNCRL AB 3-0 PS2 18 (SUTURE) IMPLANT
SUT PDS AB 1 CTX 36 (SUTURE) IMPLANT
SUT PROLENE 4 0 RB 1 (SUTURE) ×6
SUT PROLENE 4 0 SH DA (SUTURE) IMPLANT
SUT PROLENE 4-0 RB1 .5 CRCL 36 (SUTURE) IMPLANT
SUT SILK  1 MH (SUTURE) ×4
SUT SILK 1 MH (SUTURE) ×2 IMPLANT
SUT SILK 2 0 SH CR/8 (SUTURE) IMPLANT
SUT STEEL 6MS V (SUTURE) IMPLANT
SUT STEEL STERNAL CCS#1 18IN (SUTURE) IMPLANT
SUT STEEL SZ 6 DBL 3X14 BALL (SUTURE) IMPLANT
SUT VIC AB 2-0 CT1 27 (SUTURE) ×4
SUT VIC AB 2-0 CT1 TAPERPNT 27 (SUTURE) IMPLANT
SUT VIC AB 2-0 CTX 27 (SUTURE) IMPLANT
SUT VIC AB 2-0 CTX 36 (SUTURE) IMPLANT
SUT VIC AB 3-0 SH 27 (SUTURE) ×8
SUT VIC AB 3-0 SH 27X BRD (SUTURE) ×4 IMPLANT
SUT VICRYL 0 TIES 12 18 (SUTURE) ×2 IMPLANT
SUT VICRYL 0 UR6 27IN ABS (SUTURE) ×4 IMPLANT
SYR 20CC LL (SYRINGE) ×2 IMPLANT
SYSTEM SAHARA CHEST DRAIN ATS (WOUND CARE) IMPLANT
TAPE CLOTH 4X10 WHT NS (GAUZE/BANDAGES/DRESSINGS) IMPLANT
TOWEL GREEN STERILE (TOWEL DISPOSABLE) IMPLANT
TOWEL GREEN STERILE FF (TOWEL DISPOSABLE) IMPLANT
TRAY FOLEY SLVR 16FR TEMP STAT (SET/KITS/TRAYS/PACK) IMPLANT
TROCAR PORT AIRSEAL 12X150 (TUBING) IMPLANT
TROCAR PORT AIRSEAL 8X120 (TROCAR) IMPLANT

## 2022-10-07 NOTE — Anesthesia Procedure Notes (Signed)
Procedure Name: Intubation Date/Time: 10/07/2022 7:56 AM  Performed by: Bryson Corona, CRNAPre-anesthesia Checklist: Patient identified, Emergency Drugs available, Suction available and Patient being monitored Patient Re-evaluated:Patient Re-evaluated prior to induction Oxygen Delivery Method: Circle System Utilized Preoxygenation: Pre-oxygenation with 100% oxygen Induction Type: IV induction Ventilation: Two handed mask ventilation required and Oral airway inserted - appropriate to patient size Laryngoscope Size: Mac and 4 Grade View: Grade I Tube type: Oral Endobronchial tube: Left, Double lumen EBT and EBT position confirmed by auscultation and 39 Fr Number of attempts: 1 Airway Equipment and Method: Stylet and Oral airway Placement Confirmation: ETT inserted through vocal cords under direct vision, positive ETCO2 and breath sounds checked- equal and bilateral Tube secured with: Tape Dental Injury: Teeth and Oropharynx as per pre-operative assessment

## 2022-10-07 NOTE — Progress Notes (Signed)
Pt states he had a positive COVID test Oct 23, pt pulled up results from provider to show. No retest done today.

## 2022-10-07 NOTE — Hospital Course (Addendum)
HPI: This is a 61 y.o. 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  He was originally undergoing a CT scan for coronary calcium score, and a nodular mass was identified.  He does admit to some fatigue over the past several months.  He also has some occasional musculoskeletal chest pain when he leans over.  He denies any muscle weakness or neurologic symptoms.  His weight has been stable.  He occasionally has some night sweats. He returned to the office on 09/27/2022 to discuss surgical plans for resection of his anterior mediastinal mass. 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 Biopsies at [facility] were nonconclusive. 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  Dr. Cliffton Asters discussed potential risks, benefits, and complications of the surgery (robotic assisted resection of the mediastinal mass). He discussed that we would start on the right side and move then to the left side, due to the size of the mass.  Hospital Course: Patient underwent Xi robotic assisted right and left thoracoscopy with resection of anterior mediastinal mass followed by conversion to median sternotomy. 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 It was taken en bloc. Patient was transported from the OR to [facility] ICU intubated. Pulmonary/CCm evaluated patient. Patient was extubated early evening of 12/11. A line was removed. He was on bi pap. Chest tubes had minor output and were to suction. Chest tubes were removed on 12/13. He was restarted on Lisinopril for hypertension. He was put on bi pap (had been on HFNC during day) for post op hypoxemia. He was give Lasix. He went into a fib with RVR late evening of 12/13 and was put on a Amiodarone drip. He converted to sinus rhythm. He was able to get to a chair and tolerate HFNC during the day. He required  bi pap at night. Of note, he likely has undiagnosed OSA. He was given Glucerna for additional nutrition as was only on clear liquid diet.  It is noted that the left phrenic nerve was involved with the mass and the left diaphragm is noted to be elevated.  The right is also elevated but not to the same degree.  He continued to work on aggressive pulmonary hygiene with BiPAP use as needed.  He has some very significant physical deconditioning and PT/OT consultations have been obtained to assist with rehabilitation modalities.  Blood sugars are somewhat difficult to control and he has been restarted on his Metformin. Patient's pulmonary status (ARF with hypoxia and post op hypoxemia) continued to improve. He is now on a [place] during the day and still requires bi pap at night. Rosalyn Gess and Pulmicort inhalers were continued. Lopressor was titrated accordingly for hypertension. He was started on Flomax. He was surgically stable for transfer from the ICU to [facility] for further convalescence on 12/21. Per pulmonary, he will need at home bi pap. We are weaning [place] daily. He is felt surgically stable for discharge today.

## 2022-10-07 NOTE — Transfer of Care (Signed)
Immediate Anesthesia Transfer of Care Note  Patient: George Wallace  Procedure(s) Performed: XI ROBOTIC ASSISTED BILATERAL RESECTION OF MEDIASTINAL MASS  AND STERNOTOMY (Bilateral) INTERCOSTAL NERVE BLOCK (Chest)  Patient Location: ICU  Anesthesia Type:General  Level of Consciousness: Patient remains intubated per anesthesia plan  Airway & Oxygen Therapy: Patient remains intubated per anesthesia plan and Patient placed on Ventilator (see vital sign flow sheet for setting)  Post-op Assessment: Report given to RN and Post -op Vital signs reviewed and stable  Post vital signs: Reviewed and stable  Last Vitals:  Vitals Value Taken Time  BP 112/55 10/07/22 1445  Temp 35.5 C 10/07/22 1448  Pulse 66 10/07/22 1448  Resp 20 10/07/22 1448  SpO2 100 % 10/07/22 1448  Vitals shown include unvalidated device data.  Last Pain:  Vitals:   10/07/22 0634  TempSrc:   PainSc: 0-No pain         Complications:  Encounter Notable Events  Notable Event Outcome Phase Comment  Difficult to intubate - expected  Intraprocedure Filed from anesthesia note documentation.

## 2022-10-07 NOTE — Anesthesia Procedure Notes (Signed)
Procedure Name: Intubation Date/Time: 10/07/2022 2:19 AM  Performed by: Alvera Novel, CRNAPre-anesthesia Checklist: Patient identified, Emergency Drugs available, Suction available and Patient being monitored Patient Re-evaluated:Patient Re-evaluated prior to induction Oxygen Delivery Method: Circle System Utilized Preoxygenation: Pre-oxygenation with 100% oxygen Induction Type: IV induction Ventilation: Mask ventilation without difficulty Laryngoscope Size: Glidescope and 4 Grade View: Grade I Tube type: Oral Tube size: 7.5 mm Number of attempts: 1 Airway Equipment and Method: Stylet and Oral airway Placement Confirmation: ETT inserted through vocal cords under direct vision, positive ETCO2 and breath sounds checked- equal and bilateral Secured at: 22 cm Tube secured with: Tape Dental Injury: Teeth and Oropharynx as per pre-operative assessment  Difficulty Due To: Difficulty was anticipated

## 2022-10-07 NOTE — Consult Note (Addendum)
NAME:  George Wallace, MRN:  [id number], DOB:  [birth year], LOS: 0 ADMISSION DATE:  10/07/2022 CONSULTATION DATE:  12/11 REFERRING MD:  Dr. Kipp Brood CHIEF COMPLAINT:  resection of mediastinal mass and sternotomy  History of Present Illness:  Patient is a 61 year old male with pertinent PMH of DMT2, HTN, HLD presents to [facility] on 12/11 for resection of mediastinal mass and sternotomy.  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  Biopsies were performed at [facility] that were inconclusive.  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  On 12/11 patient arrived to [facility] for elective surgery.  Resection of mediastinal mass was performed. 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  It was taken en bloc.  Required sternotomy to anatomy at the vein with cessation of bleeding.  Postop transferred to ICU intubated.  PCCM consulted.  Pertinent Medical History:   Past Medical History:  Diagnosis Date   Diabetes ([place])    Diet controlled   Hyperlipidemia      Significant Hospital Events: Including procedures, antibiotic start and stop dates in addition to other pertinent events   12/11 - s/p sternotomy and resection of mediastinal mass; post op intubated  Interim History / Subjective:  See above  Objective:  Blood pressure (!) 112/55, pulse 85, temperature 97.7 F (36.5 C), temperature source Oral, resp. rate (!) 26, height 5\' 8"  (1.727 m), weight 129.3 kg, SpO2 100 %.    Vent Mode: SIMV;PSV;PRVC FiO2 (%):  [50 %] 50 % Set Rate:  [12 bmp] 12 bmp Vt Set:  [540 mL] 540 mL PEEP:  [5 cmH20] 5 cmH20 Pressure Support:  [10 cmH20] 10 cmH20   Intake/Output Summary (Last 24 hours) at 10/07/2022 1500 Last data filed at 10/07/2022 1315 Gross per 24 hour  Intake 3960 ml  Output 2105 ml  Net 1855 ml   Filed Weights   10/07/22 0617  Weight: 129.3 kg     Physical Examination: General:   ill appearing on mech vent HEENT: MM pink/moist; ETT in place Neuro: sedated CV: s1s2, RRR, no m/r/g PULM:  dim clear BS bilaterally; on mech vent PRVC GI: soft, bsx4 active  Extremities: warm/dry, no edema  Skin: no rashes or lesions appreciated   Resolved Hospital Problem List:    Assessment & Plan:     Postoperative ventilator management 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: some of the left phrenic nerve removed during tumor resection P: - Continue full vent support (4-8cc/kg IBW) - Wean FiO2 for O2 sat > 90% - Daily WUA/SBT, rapid wean with SIMV per protocol - VAP bundle - Pulmonary hygiene - F/u ABG - PAD protocol for sedation: Precedex for goal RASS 0 to -1 - will likely need to extubate to bipap with concern of diaphragmatic paralyzation  S/p resection of mediastinal mass and sternotomy P: - Postoperative care per TCTS - continue neo for map goal >65; consider switching to levo if unable to wean neo - cbc, pt/ptt/inr pending - CT management per protocol - Insulin gtt per protocol  DMT2 P: -insulin gtt per protocol -cbg monitoring  HTN HLD P: -cont statin -hold lisinopril while hypotensive   Best Practice: (right click and "Reselect all SmartList Selections" daily)   Diet/type: NPO w/ meds via tube DVT prophylaxis: SCD GI prophylaxis: H2B Lines: Central line and Arterial Line Foley:  Yes, and it is still needed Code Status:  full code Last  date of multidisciplinary goals of care discussion [per primary]  Labs:  CBC: Recent Labs  Lab 10/03/22 1313  WBC 5.4  HGB 13.5  HCT 41.7  MCV 94.3  PLT 326    Basic Metabolic Panel: Recent Labs  Lab 10/03/22 1313  NA 136  K 3.9  CL 106  CO2 24  GLUCOSE 101*  BUN 7  CREATININE 0.95  CALCIUM 8.7*   GFR: Estimated Creatinine Clearance: 109.9 mL/min (by C-G formula based on SCr of 0.95 mg/dL). Recent Labs  Lab 10/03/22 1313  WBC 5.4    Liver  Function Tests: Recent Labs  Lab 10/03/22 1313  AST 22  ALT 23  ALKPHOS 82  BILITOT 0.6  PROT 6.8  ALBUMIN 3.5   No results for input(s): "LIPASE", "AMYLASE" in the last 168 hours. No results for input(s): "AMMONIA" in the last 168 hours.  ABG    Component Value Date/Time   TCO2 23 02/15/2015 1456     Coagulation Profile: Recent Labs  Lab 10/03/22 1313  INR 1.0    Cardiac Enzymes: No results for input(s): "CKTOTAL", "CKMB", "CKMBINDEX", "TROPONINI" in the last 168 hours.  HbA1C: Hgb A1c MFr Bld  Date/Time Value Ref Range Status  10/03/2022 02:00 PM 6.9 (H) 4.8 - 5.6 % Final    Comment:    (NOTE)         Prediabetes: 5.7 - 6.4         Diabetes: >6.4         Glycemic control for adults with diabetes: <7.0     CBG: Recent Labs  Lab 10/03/22 1312 10/07/22 0623  GLUCAP 92 124*    Review of Systems:   Patient is encephalopathic and/or intubated. Therefore history has been obtained from chart review.    Past Medical History:  He,  has a past medical history of Diabetes (HCC) and Hyperlipidemia.   Surgical History:   Past Surgical History:  Procedure Laterality Date   SINUS SURGERY WITH INSTATRAK       Social History:   reports that he has never smoked. He has never used smokeless tobacco. He reports that he does not drink alcohol and does not use drugs.   Family History:  His family history includes Emphysema in his father; Heart disease in an other family member.   Allergies: No Known Allergies   Home Medications: Prior to Admission medications   Medication Sig Start Date End Date Taking? Authorizing Provider  cetirizine (ZYRTEC) 10 MG tablet Take 10 mg by mouth daily as needed for allergies.   Yes [provider]  acetaminophen (TYLENOL) 500 MG tablet Take 1,000 mg by mouth every 6 (six) hours as needed for moderate pain.    [provider]  albuterol (PROVENTIL HFA;VENTOLIN HFA) 108 (90 BASE) MCG/ACT inhaler Inhale 2 puffs  into the lungs every 4 (four) hours as needed for wheezing. 02/15/15   Graylon Good, PA-C  lisinopril (ZESTRIL) 2.5 MG tablet Take 1.25 mg by mouth daily. 04/08/22   [provider]  metFORMIN (GLUCOPHAGE-XR) 500 MG 24 hr tablet Take 500 mg by mouth every morning. 04/08/22   [provider]  rosuvastatin (CRESTOR) 10 MG tablet Take 10 mg by mouth daily. 04/21/22   [provider]  Spacer/Aero-Holding Chambers (AEROCHAMBER PLUS FLO-VU LARGE) MISC 1 each by Other route once. 02/15/15   Graylon Good, PA-C     Critical care time: 45 minutes   Lidia Collum, PA-C [facility] 10/07/22  3:00 PM  Please see [email or web address] for pager details.  From 7A-7P if no response, please call [phone] After hours, please call Warren Lacy [phone]  10/07/2022   Seen for post op vent management.   61 year old man with hx of metabolic syndrome presenting for resection of anterior mediastinal mass.  Unfortunately tumor involved left phrenic nerve and some bleeding due to anatomy so had to convert to open sternotomy.   Arrives to unit intubated and sedated.   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   Discussed with RT, RN.   Erskine Emery MD PCCM

## 2022-10-07 NOTE — Op Note (Signed)
[address]       [place] [address]             [phone]        10/07/2022  Patient:  Ailene Ravel Pre-Op Dx: Anterior mediastinal mass Post-op Dx: Same Procedure: - Robotic assisted right and left video thoracoscopy -Median sternotomy -Repair of innominate vein - Intercostal nerve block  Surgeon and Role:      * Abagayle Klutts, Eliezer Lofts, MD - Primary Assistant: Jacques Earthly, PA-C  Anesthesia  general EBL:  Blood Administration: 2 units pRBC, 330 cellsaver Specimen:  anterior mediastinal mass  Drains: 19 F chest tube in right and left chest Counts: correct   Indications: 61 year old male with large anterior mediastinal mass. We discussed the risks and benefits of robotic assisted resection. We will start out on the right side and then moved to the left side given the size.   Findings: We began on the right side.  All the mass was very close to the phrenic nerve.  We were able to mobilize it off.  There were some adhesions going up into the horns of the neck.  This likely corresponds to the previous area for the biopsy.  Once we have completely mobilized as much as we could from the right side, we then closed and moved to the left chest.  On the left side the phrenic nerve was deeply invested in the tumor.  It was not possible to mobilize it off the mass.  It was taken en bloc.  While mobilizing the horn and injury to the innominate vein was identified.  We held pressure for 30 minutes without success.  At this point I elected to convert to a sternotomy.  Pledgeted sutures required to repair the innominate vein.  There is no other source of bleeding.  Operative Technique: After the risks, benefits and alternatives were thoroughly discussed, the patient was brought to the operative theatre.  Anesthesia was induced, and the patient was then placed in a left lazy lateral decubitus position and was prepped and draped in normal sterile fashion.  An  appropriate surgical pause was performed, and pre-operative antibiotics were dosed accordingly.  We began by placing our 4 robotic ports in the the intercostal spaces targeting the pericardium.  The robot was then docked and all instruments were passed under direct visualization.  The mass was evident along the anterior surface of the pericardium.  We began by mobilizing it posteriorly first.  We then worked our way down laterally and noted that it was very close to the phrenic nerve.  I was able to create a plane between the phrenic nerve and the mass.  I then continued the mobilization up through the neck and along the posterior surface of the sternum.  One of the right portion of the mass was freely mobile I then placed it into the left chest.  I ensured that we had adequate hemostasis and then undocked the robot.  All ports were removed and the sites were closed with absorbable suture.  We then positioned the patient in a right lateral lazy decubitus position and then prepped and draped in normal sterile fashion.  Another timeout was performed.  We began by placing our 3 robotic ports in the the intercostal spaces targeting the pericardium.  A 8mm assistant port was placed in the 4th intercostal space in the posterior axillary line.  The robot was then docked and all instruments were passed under direct  visualization.   I continued with the mobilization of the left portion of the mass.  It was quite evident that it was stuck to the phrenic nerve.  It was not possible to create a plane thus the phrenic nerve had to be taken along with the mass.  It was also densely adherent to the left internal thoracic artery.  I attempted to mobilize the mass off of the artery but while dissecting out the mass from the innominate vein and injury was created.  I took the left internal thoracic artery to achieve better visualization.  Once it was clear the innominate vein injury required repair to perform sternotomy.  The  sternal retractor was placed.  While holding pressure the rest of the thymic corn was divided and the specimen was removed.  Pledgeted suture was then used to repair the innominate vein.  Meticulous hemostasis was obtained, and bilateral pleural drains were placed.  The sternum was reapproximated with wires.  Skin and soft tissue were closed with absorbable suture.  The patient tolerated the procedure without any immediate complications, and was transferred to the ICU in stable condition.  Valjean Ruppel Keane [name]

## 2022-10-07 NOTE — Interval H&P Note (Signed)
History and Physical Interval Note:  10/07/2022 7:27 AM  George Wallace  has presented today for surgery, with the diagnosis of MEDIASTINAL MASS.  The various methods of treatment have been discussed with the patient and family. After consideration of risks, benefits and other options for treatment, the patient has consented to  Procedure(s): XI ROBOTIC ASSISTED RESECTION OF MEDIASTINAL MASS (Bilateral) as a surgical intervention.  The patient's history has been reviewed, patient examined, no change in status, stable for surgery.  I have reviewed the patient's chart and labs.  Questions were answered to the patient's satisfaction.     Janel Beane Keane [name]

## 2022-10-07 NOTE — Brief Op Note (Signed)
10/07/2022  1:33 PM  PATIENT:  George Wallace  61 y.o. male  PRE-OPERATIVE DIAGNOSIS:  MEDIASTINAL MASS  POST-OPERATIVE DIAGNOSIS:  MEDIASTINAL MASS  PROCEDURE: XI ROBOTIC ASSISTED RIGHT and LEFT THORACOSCOPY with RESECTION OF MEDIASTINAL MASS,  STERNOTOMY, and INTERCOSTAL NERVE BLOCK  SURGEON:  Surgeon(s) and Role:    Lightfoot, Eliezer Lofts, MD - Primary  PHYSICIAN ASSISTANT: Doree Fudge PA-C  ANESTHESIA:   general  EBL:  1900 mL   BLOOD ADMINISTERED: Two CC PRBC and 330 cc CC CELLSAVER, and 4 Albumin  DRAINS:  2 19 Blake drains placed in right and left pleural spaces    LOCAL MEDICATIONS USED:  OTHER Exparel  SPECIMEN:  Source of Specimen:  Anterior mediastinal mass  DISPOSITION OF SPECIMEN:  PATHOLOGY  COUNTS CORRECT:  YES  DICTATION: .Dragon Dictation  PLAN OF CARE: Admit to inpatient   PATIENT DISPOSITION:  ICU - extubated and stable.   Delay start of Pharmacological VTE agent (>24hrs) due to surgical blood loss or risk of bleeding: yes

## 2022-10-07 NOTE — Anesthesia Procedure Notes (Signed)
Arterial Line Insertion Start/End12/08/2022 7:54 AM, 10/07/2022 7:58 AM Performed by: Dorie Rank, CRNA  Patient location: OR. Preanesthetic checklist: patient identified, IV checked, site marked, risks and benefits discussed, surgical consent, monitors and equipment checked, pre-op evaluation, timeout performed and anesthesia consent Lidocaine 1% used for infiltration Left, radial was placed Catheter size: 20 G Hand hygiene performed  and maximum sterile barriers used   Attempts: 1 Procedure performed without using ultrasound guided technique. Following insertion, dressing applied and Biopatch. Post procedure assessment: normal and unchanged  Patient tolerated the procedure well with no immediate complications.

## 2022-10-07 NOTE — Procedures (Signed)
Extubation Procedure Note  Patient Details:   Name: George Wallace DOB: 03-03-1961 MRN: [id number]   Airway Documentation:    Vent end date: 10/07/22 Vent end time: 1735   Evaluation  O2 sats: stable throughout Complications: No apparent complications Patient did tolerate procedure well. Bilateral Breath Sounds: Clear  Patient extubated to 4L [place] per order. Positive cuff leak noted. Patient able to vocalize post extubation.   Clent Ridges 10/07/2022, 5:45 PM

## 2022-10-08 ENCOUNTER — Inpatient Hospital Stay (HOSPITAL_COMMUNITY): Payer: BC Managed Care – PPO

## 2022-10-08 ENCOUNTER — Encounter (HOSPITAL_COMMUNITY): Payer: Self-pay | Admitting: Thoracic Surgery (Cardiothoracic Vascular Surgery)

## 2022-10-08 ENCOUNTER — Other Ambulatory Visit: Payer: Self-pay

## 2022-10-08 DIAGNOSIS — R599 Enlarged lymph nodes, unspecified: Secondary | ICD-10-CM | POA: Diagnosis not present

## 2022-10-08 DIAGNOSIS — J9601 Acute respiratory failure with hypoxia: Secondary | ICD-10-CM | POA: Diagnosis not present

## 2022-10-08 DIAGNOSIS — J9859 Other diseases of mediastinum, not elsewhere classified: Secondary | ICD-10-CM | POA: Diagnosis not present

## 2022-10-08 DIAGNOSIS — J810 Acute pulmonary edema: Secondary | ICD-10-CM | POA: Diagnosis not present

## 2022-10-08 DIAGNOSIS — R6510 Systemic inflammatory response syndrome (SIRS) of non-infectious origin without acute organ dysfunction: Secondary | ICD-10-CM | POA: Diagnosis not present

## 2022-10-08 LAB — GLUCOSE, CAPILLARY
Glucose-Capillary: 133 mg/dL — ABNORMAL HIGH (ref 70–99)
Glucose-Capillary: 135 mg/dL — ABNORMAL HIGH (ref 70–99)
Glucose-Capillary: 155 mg/dL — ABNORMAL HIGH (ref 70–99)
Glucose-Capillary: 157 mg/dL — ABNORMAL HIGH (ref 70–99)
Glucose-Capillary: 163 mg/dL — ABNORMAL HIGH (ref 70–99)
Glucose-Capillary: 172 mg/dL — ABNORMAL HIGH (ref 70–99)
Glucose-Capillary: 174 mg/dL — ABNORMAL HIGH (ref 70–99)

## 2022-10-08 LAB — BLOOD PRODUCT ORDER (VERBAL) VERIFICATION

## 2022-10-08 LAB — CBC
HCT: 33.9 % — ABNORMAL LOW (ref 39.0–52.0)
HCT: 35.4 % — ABNORMAL LOW (ref 39.0–52.0)
Hemoglobin: 11.1 g/dL — ABNORMAL LOW (ref 13.0–17.0)
Hemoglobin: 11.5 g/dL — ABNORMAL LOW (ref 13.0–17.0)
MCH: 29.7 pg (ref 26.0–34.0)
MCH: 29.8 pg (ref 26.0–34.0)
MCHC: 32.7 g/dL (ref 30.0–36.0)
MCHC: 32.5 g/dL (ref 30.0–36.0)
MCV: 90.6 fL (ref 80.0–100.0)
MCV: 91.7 fL (ref 80.0–100.0)
Platelets: 212 10*3/uL (ref 150–400)
Platelets: 234 10*3/uL (ref 150–400)
RBC: 3.74 MIL/uL — ABNORMAL LOW (ref 4.22–5.81)
RBC: 3.86 MIL/uL — ABNORMAL LOW (ref 4.22–5.81)
RDW: 14.5 % (ref 11.5–15.5)
RDW: 14.6 % (ref 11.5–15.5)
WBC: 12.1 10*3/uL — ABNORMAL HIGH (ref 4.0–10.5)
WBC: 16.8 10*3/uL — ABNORMAL HIGH (ref 4.0–10.5)
nRBC: 0 % (ref 0.0–0.2)
nRBC: 0 % (ref 0.0–0.2)

## 2022-10-08 LAB — BASIC METABOLIC PANEL
Anion gap: 7 (ref 5–15)
Anion gap: 10 (ref 5–15)
BUN: 10 mg/dL (ref 6–20)
BUN: 11 mg/dL (ref 6–20)
CO2: 22 mmol/L (ref 22–32)
CO2: 23 mmol/L (ref 22–32)
Calcium: 8 mg/dL — ABNORMAL LOW (ref 8.9–10.3)
Calcium: 8.1 mg/dL — ABNORMAL LOW (ref 8.9–10.3)
Chloride: 106 mmol/L (ref 98–111)
Chloride: 101 mmol/L (ref 98–111)
Creatinine, Ser: 0.79 mg/dL (ref 0.61–1.24)
Creatinine, Ser: 0.93 mg/dL (ref 0.61–1.24)
GFR, Estimated: >60 mL/min (ref >60)
GFR, Estimated: >60 mL/min (ref >60)
Glucose, Bld: 156 mg/dL — ABNORMAL HIGH (ref 70–99)
Glucose, Bld: 161 mg/dL — ABNORMAL HIGH (ref 70–99)
Potassium: 4.4 mmol/L (ref 3.5–5.1)
Potassium: 5.5 mmol/L — ABNORMAL HIGH (ref 3.5–5.1)
Sodium: 135 mmol/L (ref 135–145)
Sodium: 134 mmol/L — ABNORMAL LOW (ref 135–145)

## 2022-10-08 LAB — MAGNESIUM
Magnesium: 1.8 mg/dL (ref 1.7–2.4)
Magnesium: 1.9 mg/dL (ref 1.7–2.4)

## 2022-10-08 LAB — PREPARE RBC (CROSSMATCH)

## 2022-10-08 MED ORDER — ENOXAPARIN SODIUM 40 MG/0.4ML IJ SOSY
40.0000 mg | PREFILLED_SYRINGE | Freq: Every day | INTRAMUSCULAR | Status: DC
  Administered 2022-10-08 – 2022-10-20 (×13): 40 mg via SUBCUTANEOUS
  Filled 2022-10-08 (×15): qty 0.4

## 2022-10-08 MED ORDER — METOCLOPRAMIDE HCL 5 MG/ML IJ SOLN
10.0000 mg | Freq: Four times a day (QID) | INTRAMUSCULAR | Status: DC
  Administered 2022-10-08 – 2022-10-16 (×30): 10 mg via INTRAVENOUS
  Filled 2022-10-08 (×29): qty 2

## 2022-10-08 MED ORDER — FUROSEMIDE 10 MG/ML IJ SOLN
40.0000 mg | Freq: Once | INTRAMUSCULAR | Status: AC
  Administered 2022-10-08: 40 mg via INTRAVENOUS
  Filled 2022-10-08: qty 4

## 2022-10-08 MED ORDER — CALCIUM GLUCONATE-NACL 1-0.675 GM/50ML-% IV SOLN
1.0000 g | Freq: Once | INTRAVENOUS | Status: AC
  Administered 2022-10-08: 1000 mg via INTRAVENOUS
  Filled 2022-10-08: qty 50

## 2022-10-08 NOTE — Progress Notes (Incomplete)
[address]       [place] [address]             [phone]    Physician Discharge Summary  Patient ID: George Wallace MRN: [id number] DOB/AGE: [birth year] 59 y.o.  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   Type 2 diabetes mellitus without complication, without long-term current use of insulin (HCC) 10/07/2022   Hardening of the aorta (main artery of the heart) (HCC) 06/07/2022   Excessive sleepiness 06/07/2022   Hyperlipidemia, unspecified 06/07/2022   Inattention 06/07/2022   Obstructive sleep apnea (adult) (pediatric) 06/07/2022   Personal history of colonic polyps 06/07/2022   Type 2 diabetes mellitus with other specified complication (HCC) 06/07/2022   Vitamin D deficiency 06/07/2022   Mediastinal mass 05/31/2022   Intermittent asthma without complication 03/04/2015   Cardiomegaly 12/06/2014   Wheeze 12/06/2014   Morbid obesity (HCC) 12/06/2014     Discharge Diagnoses:  Patient Active Problem List   Diagnosis Date Noted   Acute respiratory failure (HCC) 10/07/2022   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   Type 2 diabetes mellitus without complication, without long-term current use of insulin (HCC) 10/07/2022   Hardening of the aorta (main artery of the heart) (HCC) 06/07/2022   Excessive sleepiness 06/07/2022   Hyperlipidemia, unspecified 06/07/2022   Inattention 06/07/2022   Obstructive sleep apnea (adult) (pediatric) 06/07/2022   Personal history of colonic polyps 06/07/2022   Type 2 diabetes mellitus with other specified complication (HCC) 06/07/2022   Vitamin D deficiency 06/07/2022   Mediastinal mass 05/31/2022   Intermittent asthma without complication 03/04/2015   Cardiomegaly 12/06/2014   Wheeze 12/06/2014   Morbid obesity (HCC) 12/06/2014     Discharged  Condition: stable  HPI: This is a 61 y.o. 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  He was originally undergoing a CT scan for coronary calcium score, and a nodular mass was identified.  He does admit to some fatigue over the past several months.  He also has some occasional musculoskeletal chest pain when he leans over.  He denies any muscle weakness or neurologic symptoms.  His weight has been stable.  He occasionally has some night sweats. He returned to the office on 09/27/2022 to discuss surgical plans for resection of his anterior mediastinal mass. 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 Biopsies at [facility] were nonconclusive. 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  Dr. Cliffton Asters discussed potential risks, benefits, and complications of the surgery (robotic assisted resection of the mediastinal mass). He discussed that we would start on the right side and move then to the left side, due to the size of the mass.  Hospital Course: Patient underwent Xi robotic assisted right and left thoracoscopy with resection of anterior mediastinal mass followed by conversion to median sternotomy. Patient was transported from the OR to [facility] ICU intubated. Pulmonary/CCm evaluated patient. Patient was extubated early evening of 12/11. He was on bi pap. Chest tubes had minor output and were to suction.  Consults: pulmonary/intensive care  Significant Diagnostic Studies: {diagnostics:18242}  Narrative & Impression  CLINICAL DATA:  Pneumothorax.   EXAM: PORTABLE CHEST 1 VIEW   COMPARISON:  10/03/2022 and CT chest 09/27/2022.   FINDINGS: Endotracheal tube  terminates approximately 3 cm above the carina. Heart size is accentuated by AP semi upright low volume technique. Bilateral chest tubes in place. No definite pneumothorax. Central pulmonary  vascular congestion. Mild bibasilar atelectasis. Subcutaneous emphysema along the chest wall bilaterally.   IMPRESSION: 1. No definite pneumothorax with bilateral chest tubes in place. 2. Low lung volumes with central pulmonary vascular congestion and bibasilar atelectasis.     Electronically Signed   By: Lorin Picket M.D.   On: 10/07/2022 15:18      Treatments: surgery:  - Robotic assisted right and left video thoracoscopy -Median sternotomy -Repair of innominate vein - Intercostal nerve block by Dr. Kipp Brood on 10/07/2022  Pathology:***  Discharge Exam: Blood pressure (!) 161/84, pulse 97, temperature 99 F (37.2 C), resp. rate (!) 22, height 5\' 8"  (1.727 m), weight 133.8 kg, SpO2 96 %. {physical [id number]   Discharge Medications:      Allergies as of 10/08/2022   No Known Allergies   Med Rec must be completed prior to using this [facility]***       Follow-up Information     Lajuana Matte, MD. Go on 10/18/2022.   Specialty: Cardiothoracic Surgery Why: Appointment time is at 10:10 am Contact information: [place] [address] [id number]                 Signed:  Arnoldo Lenis 10/08/2022, 2:02 PM

## 2022-10-08 NOTE — Evaluation (Signed)
Occupational Therapy Evaluation Patient Details Name: George Wallace MRN: [id number] DOB: 1960/11/25 Today's Date: 10/08/2022   History of Present Illness Pt is a 61 y.o. male with known mediastinal mass admitted 10/07/22 for scheduled R & L RATS, mass resection, sternotomy. 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   Clinical Impression   [name] was evaluated s/p the above admission list, he is indep at baseline, works, and lives with his wife who works as a Charity fundraiser. Upon evaluation pt demonstrated functional limitations due to sternal precautions, generalized weakness, decreased cardiopulmonary endurance, SOB and increased WOB with activity and pain. Overall he was min guard fro bed mobility and for simple transfers. Mobility limited due to SpO2 and RR. Cues given throughout for sternal precautions. Due to deficits listed below, pt requires up to max A for ADLs. OT to continue to follow acutely. Anticipate great progress, recommend d/c home with HHOT.      Recommendations for follow up therapy are one component of a multi-disciplinary discharge planning process, led by the attending physician.  Recommendations may be updated based on patient status, additional functional criteria and insurance authorization.   Follow Up Recommendations  Home health OT     Assistance Recommended at Discharge Frequent or constant Supervision/Assistance  Patient can return home with the following A little help with walking and/or transfers;A little help with bathing/dressing/bathroom;Assistance with cooking/housework;Assist for transportation;Help with stairs or ramp for entrance    Functional Status Assessment  Patient has had a recent decline in their functional status and demonstrates the ability to make significant improvements in function in a reasonable and predictable amount of time.  Equipment Recommendations  None recommended by OT        Precautions / Restrictions Precautions Precautions: Fall;Sternal Precaution Booklet Issued: No Precaution Comments: watch SpO2 and RR Restrictions Weight Bearing Restrictions: No Other Position/Activity Restrictions: UE sternal percautions      Mobility Bed Mobility Overal bed mobility: Needs Assistance Bed Mobility: Rolling, Sidelying to Sit Rolling: Min guard Sidelying to sit: Min guard       General bed mobility comments: cues for sternal precuations with great carryover    Transfers Overall transfer level: Needs assistance Equipment used: None Transfers: Sit to/from Stand, Bed to chair/wheelchair/BSC Sit to Stand: Min guard     Step pivot transfers: Min guard     General transfer comment: close min G +2 for lines and safety. Great maintainance of sternal precautions      Balance Overall balance assessment: Needs assistance Sitting-balance support: Feet supported Sitting balance-Leahy Scale: Good     Standing balance support: No upper extremity supported, During functional activity Standing balance-Leahy Scale: Fair             ADL either performed or assessed with clinical judgement   ADL Overall ADL's : Needs assistance/impaired Eating/Feeding: Independent;Sitting   Grooming: Set up;Sitting   Upper Body Bathing: Moderate assistance;Sitting   Lower Body Bathing: Maximal assistance;Sit to/from stand   Upper Body Dressing : Minimal assistance;Sitting Upper Body Dressing Details (indicate cue type and reason): for sternal precuations Lower Body Dressing: Maximal assistance;Sit to/from stand   Toilet Transfer: Min guard;Stand-pivot   Toileting- Architect and Hygiene: Maximal assistance;Sit to/from stand       Functional mobility during ADLs: Min guard;Cueing for sequencing;Cueing for safety General ADL Comments: cues and assist for sternal precautions and PLB cues     Vision Baseline Vision/History: 0 No visual deficits Vision  Assessment?: No apparent  visual deficits     Perception Perception Perception Tested?: No   Praxis Praxis Praxis tested?: Not tested    Pertinent Vitals/Pain Pain Assessment Pain Assessment: Faces Faces Pain Scale: Hurts little more Pain Location: generalized with movement Pain Descriptors / Indicators: Grimacing Pain Intervention(s): Monitored during session, Patient requesting pain meds-RN notified     Hand Dominance Right   Extremity/Trunk Assessment Upper Extremity Assessment Upper Extremity Assessment: Generalized weakness (did not fully assess due to sternal precautions)   Lower Extremity Assessment Lower Extremity Assessment: Defer to PT evaluation   Cervical / Trunk Assessment Cervical / Trunk Assessment: Other exceptions Cervical / Trunk Exceptions: sternal   Communication Communication Communication: Expressive difficulties (SOB affecting communication)   Cognition Arousal/Alertness: Awake/alert Behavior During Therapy: WFL for tasks assessed/performed Overall Cognitive Status: Within Functional Limits for tasks assessed                                 General Comments: followed precautions well, cog WFL for basic tasks assessed. Pt askign great medical questions/concerns     General Comments  SpO2 88-92% on 4L, RN bumped to 6L at the end of the session. RR 24-34 with cues to PLB. BP and HR stable. Some bleeding noted from R chest RATS site            Home Living Family/patient expects to be discharged to:: Private residence Living Arrangements: Spouse/significant other Available Help at Discharge: Available PRN/intermittently;Available 24 hours/day Type of Home: House Home Access: Stairs to enter [employer] of Steps: 7-8 Entrance Stairs-Rails: Right;Left Home Layout: Two level;Bed/bath upstairs Alternate Level Stairs-Number of Steps: flight with landing   Bathroom Shower/Tub: Teacher, early years/pre: Standard      Home Equipment: Shower seat - built in   Additional Comments: wife works as a Water quality scientist Prior Level of Function : Independent/Modified Independent;Driving;Working/employed             Mobility Comments: no AD ADLs Comments: works for [employer]        OT Problem List: Decreased range of motion;Decreased strength;Decreased activity tolerance;Impaired balance (sitting and/or standing);Decreased safety awareness;Decreased knowledge of use of DME or AE;Decreased knowledge of precautions;Cardiopulmonary status limiting activity      OT Treatment/Interventions: Self-care/ADL training;Therapeutic exercise;DME and/or AE instruction;Therapeutic activities;Patient/family education;Balance training    OT Goals(Current goals can be found in the care plan section) Acute Rehab OT Goals Patient Stated Goal: better breathing OT Goal Formulation: With patient Time For Goal Achievement: 10/22/22 Potential to Achieve Goals: Good ADL Goals Pt Will Perform Grooming: with min guard assist;standing Pt Will Perform Upper Body Dressing: with supervision;sitting Pt Will Perform Lower Body Dressing: with min guard assist;sit to/from stand Pt Will Transfer to Toilet: with supervision;ambulating Pt Will Perform Toileting - Clothing Manipulation and hygiene: with supervision;sitting/lateral leans  OT Frequency: Min 2X/week    Co-evaluation PT/OT/SLP Co-Evaluation/Treatment: Yes Reason for Co-Treatment: Complexity of the patient's impairments (multi-system involvement);To address functional/ADL transfers;For patient/therapist safety   OT goals addressed during session: ADL's and self-care      AM-PAC OT "6 Clicks" Daily Activity     Outcome Measure Help from another person eating meals?: None Help from another person taking care of personal grooming?: A Little Help from another person toileting, which includes using toliet, bedpan, or urinal?: A Lot Help from  another person bathing (including washing, rinsing, drying)?: A Lot Help from another person  to put on and taking off regular upper body clothing?: A Little Help from another person to put on and taking off regular lower body clothing?: A Lot 6 Click Score: 16   End of Session Equipment Utilized During Treatment: Oxygen (4-6L) Nurse Communication: Mobility status;Other (comment) (O2, blood)  Activity Tolerance: Patient tolerated treatment well Patient left: in chair;with call bell/phone within reach  OT Visit Diagnosis: Unsteadiness on feet (R26.81);Other abnormalities of gait and mobility (R26.89);Repeated falls (R29.6);Muscle weakness (generalized) (M62.81)                Time: [id number] OT Time Calculation (min): 21 min Charges:  OT General Charges $OT Visit: 1 Visit OT Evaluation $OT Eval Moderate Complexity: 1 Mod   Jamesina Gaugh D Causey 10/08/2022, 9:54 AM

## 2022-10-08 NOTE — Progress Notes (Addendum)
[facility] DAILY ICU PROGRESS NOTE                   [address]            [place] [address]          [phone]   1 Day Post-Op Procedure(s) (LRB): XI ROBOTIC ASSISTED BILATERAL RESECTION OF MEDIASTINAL MASS  AND STERNOTOMY (Bilateral) INTERCOSTAL NERVE BLOCK  Total Length of Stay:  LOS: 1 day   Subjective: Patient awake, alert on bi pap this am.  Objective: Vital signs in last 24 hours: Temp:  [96.1 F (35.6 C)-99.3 F (37.4 C)] 98.8 F (37.1 C) (12/12 0600) Pulse Rate:  [51-85] 71 (12/12 0600) Cardiac Rhythm: Normal sinus rhythm (12/12 0000) Resp:  [16-36] 17 (12/12 0600) BP: (85-121)/(50-81) 112/77 (12/12 0600) SpO2:  [98 %-100 %] 98 % (12/12 0600) Arterial Line BP: (79-148)/(47-86) 126/61 (12/12 0600) FiO2 (%):  [30 %-50 %] 30 % (12/12 0329) Weight:  [133.8 kg] 133.8 kg (12/12 0500)  Filed Weights   10/07/22 0617 10/08/22 0500  Weight: 129.3 kg 133.8 kg    Weight change: 4.525 kg      Intake/Output from previous day: 12/11 0701 - 12/12 0700 In: 4836.4 [I.V.:2135.1; Blood:960; IV Piggyback:1741.3] Out: [id number] [Urine:1210; Blood:1900; Chest Tube:174]  Intake/Output this shift: No intake/output data recorded.  Current Meds: Scheduled Meds:  acetaminophen  1,000 mg Oral Q6H   Or   acetaminophen (TYLENOL) oral liquid 160 mg/5 mL  1,000 mg Per Tube Q6H   acetaminophen (TYLENOL) oral liquid 160 mg/5 mL  650 mg Per Tube Once   Or   acetaminophen  650 mg Rectal Once   aspirin EC  325 mg Oral Daily   Or   aspirin  324 mg Per Tube Daily   bisacodyl  10 mg Oral Daily   Or   bisacodyl  10 mg Rectal Daily   Chlorhexidine Gluconate Cloth  6 each Topical Daily   docusate sodium  200 mg Oral Daily   insulin aspart  0-24 Units Subcutaneous Q4H   mouth rinse  15 mL Mouth Rinse 4 times per day   rosuvastatin  10 mg Oral QPM   sodium chloride flush  3 mL Intravenous Q12H   Continuous Infusions:  sodium chloride     sodium chloride     sodium  chloride     albumin human 60 mL/hr at 10/08/22 0400    ceFAZolin (ANCEF) IV 2 g (10/08/22 0511)   lactated ringers     lactated ringers     lactated ringers     nitroGLYCERIN     norepinephrine (LEVOPHED) Adult infusion Stopped (10/07/22 1936)   phenylephrine (NEO-SYNEPHRINE) Adult infusion Stopped (10/08/22 0215)   PRN Meds:.sodium chloride, albumin human, albuterol, dextrose, lactated ringers, metoprolol tartrate, midazolam, morphine injection, ondansetron (ZOFRAN) IV, mouth rinse, oxyCODONE, sodium chloride flush, traMADol  General appearance: alert, cooperative, and no distress Neurologic: intact Heart: RRR Lungs: Slightly diminished bibasilar breath sounds Abdomen: Soft, non tender, occasional bowel sounds Extremities: SCDs in place Wound: Aquacel intact. Other wounds are clean and dry  Lab Results: CBC: Recent Labs    10/07/22 2138 10/08/22 0442  WBC 12.0* 12.1*  HGB 11.2* 11.1*  HCT 32.9* 33.9*  PLT 202 212   BMET:  Recent Labs    10/07/22 2138 10/08/22 0442  NA 132* 135  K 4.8 4.4  CL 104 106  CO2 20* 22  GLUCOSE 213* 156*  BUN 10 10  CREATININE  0.95 0.79  CALCIUM 7.7* 8.0*    CMET: Lab Results  Component Value Date   WBC 12.1 (H) 10/08/2022   HGB 11.1 (L) 10/08/2022   HCT 33.9 (L) 10/08/2022   PLT 212 10/08/2022   GLUCOSE 156 (H) 10/08/2022   ALT 23 10/03/2022   AST 22 10/03/2022   NA 135 10/08/2022   K 4.4 10/08/2022   CL 106 10/08/2022   CREATININE 0.79 10/08/2022   BUN 10 10/08/2022   CO2 22 10/08/2022   INR 1.2 10/07/2022   HGBA1C 6.9 (H) 10/03/2022      PT/INR:  Recent Labs    10/07/22 1539  LABPROT 14.7  INR 1.2   Radiology: [facility] Chest Port 1 View  Result Date: 10/07/2022 CLINICAL DATA:  Pneumothorax. EXAM: PORTABLE CHEST 1 VIEW COMPARISON:  10/03/2022 and CT chest 09/27/2022. FINDINGS: Endotracheal tube terminates approximately 3 cm above the carina. Heart size is accentuated by AP semi upright low volume technique. Bilateral  chest tubes in place. No definite pneumothorax. Central pulmonary vascular congestion. Mild bibasilar atelectasis. Subcutaneous emphysema along the chest wall bilaterally. IMPRESSION: 1. No definite pneumothorax with bilateral chest tubes in place. 2. Low lung volumes with central pulmonary vascular congestion and bibasilar atelectasis. Electronically Signed   By: Leanna Battles M.D.   On: 10/07/2022 15:18     Assessment/Plan: S/P Procedure(s) (LRB): XI ROBOTIC ASSISTED BILATERAL RESECTION OF MEDIASTINAL MASS  AND STERNOTOMY (Bilateral) INTERCOSTAL NERVE BLOCK CV-SR. Will consider restarting Lisinopril as BP allows Pulmonary-Pulmonary/CCM following;hope to wean off bi pap. Chest tubes to suction. Chest tubes with 174 since surgery. CXR this am shows low lung volumes, elevated left hemidiaphragm, gastric bubble, bibasilar atelectasis. No air leak. Encourage incentive spirometer and flutter valve. Expected post op blood loss anemia--H and H this am stable at 11.1 and 33.9 4. DM-CBGs 195/164/163. Will restart Metformin once tolerating a diet well. Insulin PRN for now. 5. Supplement Calcuim   George Margaretann Loveless PA-C 10/08/2022 7:16 AM  Agree with above Extubated overnight Pain controlled Will watch in the unit today  Jeter Tomey O Jo-Anne Kluth

## 2022-10-08 NOTE — Progress Notes (Signed)
   NAME:  George Wallace, MRN:  [id number], DOB:  1961-05-18, LOS: 1 ADMISSION DATE:  10/07/2022 CONSULTATION DATE:  12/11 REFERRING MD:  Dr. Cliffton Asters CHIEF COMPLAINT:  resection of mediastinal mass and sternotomy  History of Present Illness:  Patient is a 61 year old male with pertinent PMH of DMT2, HTN, HLD presents to [facility] on 12/11 for resection of mediastinal mass and sternotomy.  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  Biopsies were performed at [facility] that were inconclusive.  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  On 12/11 patient arrived to [facility] for elective surgery.  Resection of mediastinal mass was performed. 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  It was taken en bloc.  Required sternotomy to anatomy at the vein with cessation of bleeding.  Postop transferred to ICU intubated.  PCCM consulted.  Pertinent Medical History:   Past Medical History:  Diagnosis Date   Diabetes (HCC)    Diet controlled   Hyperlipidemia      Significant Hospital Events: Including procedures, antibiotic start and stop dates in addition to other pertinent events   12/11 - s/p sternotomy and resection of mediastinal mass; post op intubated  Interim History / Subjective:  Extubated yesterday to BIPAP. Pain under okay control. Breathing is okay.  Objective:  Blood pressure 122/76, pulse 92, temperature 99 F (37.2 C), resp. rate (!) 21, height 5\' 8"  (1.727 m), weight 133.8 kg, SpO2 100 %.    Vent Mode: BIPAP;PCV FiO2 (%):  [30 %-50 %] 30 % Set Rate:  [12 bmp-16 bmp] 16 bmp Vt Set:  [540 mL] 540 mL PEEP:  [5 cmH20] 5 cmH20 Pressure Support:  [5 cmH20-10 cmH20] 5 cmH20   Intake/Output Summary (Last 24 hours) at 10/08/2022 0901 Last data filed at 10/08/2022 0700 Gross per 24 hour  Intake 4136.38 ml  Output 3289 ml  Net 847.38 ml     Filed Weights   10/07/22 0617 10/08/22 0500  Weight: 129.3 kg 133.8 kg    Physical Examination: No distress on BIPAP Abd soft, hypoactive BS Multiple mediastinal drains small bloody output Heart sounds regular, ext warm Moves al 4 ext to command  Sugars ok BMP benign CBC stable from yesterday  CXR left hemidiaphragm elevation and associated atelectasis  Resolved Hospital Problem List:    Assessment & Plan:    S/p resection of mediastinal mass and sternotomy Postoperative vent management- resolved Postoperative hypoxemia- related to splinting and L phrenic nerve palsy - Wean from BIPAP - Start diet - Drains per primary  DMT2 P: - SSI  HTN HLD P: -cont statin  Will follow while in ICU  14/12/23 MD PCCM

## 2022-10-08 NOTE — Anesthesia Postprocedure Evaluation (Signed)
Anesthesia Post Note  Patient: Jaylyn Goodlin  Procedure(s) Performed: XI ROBOTIC ASSISTED BILATERAL RESECTION OF MEDIASTINAL MASS  AND STERNOTOMY (Bilateral) INTERCOSTAL NERVE BLOCK (Chest)     Patient location during evaluation: SICU Anesthesia Type: General Level of consciousness: sedated and patient remains intubated per anesthesia plan Pain management: pain level controlled Vital Signs Assessment: post-procedure vital signs reviewed and stable Respiratory status: patient remains intubated per anesthesia plan Cardiovascular status: stable Anesthetic complications: yes   Encounter Notable Events  Notable Event Outcome Phase Comment  Difficult to intubate - expected  Intraprocedure Filed from anesthesia note documentation.    Last Vitals:  Vitals:   10/08/22 0915 10/08/22 1000  BP: (!) 166/90 137/72  Pulse: (!) 114 96  Resp: (!) 26 20  Temp:    SpO2: (!) 87% 96%    Last Pain:  Vitals:   10/08/22 0833  TempSrc:   PainSc: 6                  Lewie Loron

## 2022-10-08 NOTE — Progress Notes (Signed)
Pt placed back on NIV due to increased WOB and new onset AMS. RRT called by RN.    10/08/22 1412  Adult Ventilator Settings  Vent Type Servo i  Vent Mode PCV;BIPAP  Set Rate 8 bmp  FiO2 (%) 30 %  I Time 0.9 Sec(s)  Pressure Control 10 cmH20  PEEP 5 cmH20  Adult Ventilator Measurements  Peak Airway Pressure 19 L/min  Resp Rate Spontaneous 14 br/min  Resp Rate Total 22 br/min  Exhaled Vt 512 mL  Measured Ve 10.8 mL  SpO2 92 %  Adult Ventilator Alarms  Alarms On Y  T Apnea 20 sec(s)  VAP Prevention  HOB> 30 Degrees Y  Vent Respiratory Assessment  Level of Consciousness New agitation confusion  Respiratory Pattern Regular;Labored;Accessory muscle use;Dyspnea at rest

## 2022-10-08 NOTE — Evaluation (Signed)
Physical Therapy Evaluation Patient Details Name: George Wallace MRN: [id number] DOB: [birth year] Today's Date: 10/08/2022  History of Present Illness  Pt is a 61 y.o. 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   Clinical Impression  Pt presents with an overall decrease in functional mobility secondary to above. PTA, pt independent, working, lives with wife. Initiated educ re: sternal precautions, pulmonary hygiene, activity recommendations and importance of mobility. Today, pt able to stand and take steps to recliner with RW and min guard for balance. Pt demonstrates great strength, though activity limited by significant tachypnea; RN and RT notified. Pt would benefit from continued acute PT services to maximize functional mobility and independence prior to d/c home.     SpO2 88-92% on 4L O2 [place] HR 108, BP 152/95   Recommendations for follow up therapy are one component of a multi-disciplinary discharge planning process, led by the attending physician.  Recommendations may be updated based on patient status, additional functional criteria and insurance authorization.  Follow Up Recommendations Home health PT      Assistance Recommended at Discharge Intermittent Supervision/Assistance  Patient can return home with the following  A little help with walking and/or transfers;A little help with bathing/dressing/bathroom;Assistance with cooking/housework;Assist for transportation;Help with stairs or ramp for entrance    Equipment Recommendations  (TBD)  Recommendations for Other Services       Functional Status Assessment Patient has had a recent decline in their functional status and demonstrates the ability to make significant improvements in function in a reasonable and  predictable amount of time.     Precautions / Restrictions Precautions Precautions: Fall;Sternal;Other (comment) Precaution Booklet Issued: No Precaution Comments: watch SpO2 (does not wear baseline); chest tube; phrenic nerve involved with tumor that was remove - may impact respiratory status Restrictions Weight Bearing Restrictions: No Other Position/Activity Restrictions: UE sternal percautions      Mobility  Bed Mobility Overal bed mobility: Needs Assistance Bed Mobility: Supine to Sit     Supine to sit: Min guard, HOB elevated     General bed mobility comments: pt able to power up from elevated HOB to long sitting with min guard for balance/lines, pt opting to hold heart pillow and scooting self to EOB well    Transfers Overall transfer level: Needs assistance Equipment used: None Transfers: Sit to/from Stand, Bed to chair/wheelchair/BSC Sit to Stand: Min guard   Step pivot transfers: Min guard, +2 safety/equipment       General transfer comment: pt able to stand and take pivotal steps from bed to recliner with close min guard for balance, pt holding heart pillow; DOE 4/4    Ambulation/Gait               General Gait Details: deferred secondary to significant tachypnea  Stairs            Wheelchair Mobility    Modified Rankin (Stroke Patients Only)       Balance Overall balance assessment: Needs assistance Sitting-balance support: Feet supported, No upper extremity supported Sitting balance-Leahy Scale: Good     Standing balance support: No upper extremity supported, During functional activity Standing balance-Leahy Scale: Fair  Pertinent Vitals/Pain Pain Assessment Pain Assessment: Faces Faces Pain Scale: Hurts little more Pain Location: generalized with movement Pain Descriptors / Indicators: Grimacing Pain Intervention(s): Monitored during session, Limited activity within patient's  tolerance, Patient requesting pain meds-RN notified    Home Living Family/patient expects to be discharged to:: Private residence Living Arrangements: Spouse/significant other Available Help at Discharge: Family;Available PRN/intermittently Type of Home: House Home Access: Stairs to enter Entrance Stairs-Rails: Psychiatric nurse of Steps: 7-8 Alternate Level Stairs-Number of Steps: flight with landing Home Layout: Two level;Bed/bath upstairs Home Equipment: Shower seat - built in Additional Comments: wife works as a Marine scientist; son (18 y.o.) and daughter (66 y.o.) live nearby    Prior Function Prior Level of Function : Independent/Modified Independent;Driving;Working/employed             Mobility Comments: independent without DME, drives, works for [employer]. reports he does not exercise ADLs Comments: Independent     Hand Dominance   Dominant Hand: Right    Extremity/Trunk Assessment   Upper Extremity Assessment Upper Extremity Assessment: Generalized weakness    Lower Extremity Assessment Lower Extremity Assessment: Overall WFL for tasks assessed    Cervical / Trunk Assessment Cervical / Trunk Assessment: Other exceptions Cervical / Trunk Exceptions: s/p sternotomy  Communication   Communication: Expressive difficulties (SOB affecting communication)  Cognition Arousal/Alertness: Awake/alert Behavior During Therapy: WFL for tasks assessed/performed, Anxious Overall Cognitive Status: Within Functional Limits for tasks assessed                                 General Comments: pt expresses concerns that nerve involvement during surgery may affect breathing        General Comments General comments (skin integrity, edema, etc.): SpO2 88-92% on 4L, RN request increase to 6L at the end of the session. RR 24-34 with cues to PLB. BP and HR stable. Some bleeding noted from R chest RATS site. notified respiratory therapist and RN of significant  tachypnea at rest and with minimal activity    Exercises     Assessment/Plan    PT Assessment Patient needs continued PT services  PT Problem List Decreased strength;Decreased activity tolerance;Decreased balance;Decreased mobility;Decreased knowledge of use of DME;Decreased knowledge of precautions;Cardiopulmonary status limiting activity;Pain       PT Treatment Interventions DME instruction;Gait training;Stair training;Functional mobility training;Therapeutic activities;Therapeutic exercise;Balance training;Patient/family education    PT Goals (Current goals can be found in the Care Plan section)  Acute Rehab PT Goals Patient Stated Goal: decreased pain PT Goal Formulation: With patient Time For Goal Achievement: 10/22/22 Potential to Achieve Goals: Good    Frequency Min 3X/week     Co-evaluation   Reason for Co-Treatment: Complexity of the patient's impairments (multi-system involvement);To address functional/ADL transfers;For patient/therapist safety   OT goals addressed during session: ADL's and self-care       AM-PAC PT "6 Clicks" Mobility  Outcome Measure Help needed turning from your back to your side while in a flat bed without using bedrails?: A Little Help needed moving from lying on your back to sitting on the side of a flat bed without using bedrails?: A Little Help needed moving to and from a bed to a chair (including a wheelchair)?: A Little Help needed standing up from a chair using your arms (e.g., wheelchair or bedside chair)?: A Little Help needed to walk in hospital room?: A Lot Help needed climbing 3-5 steps with a railing? : A Lot 6  Click Score: 16    End of Session Equipment Utilized During Treatment: Oxygen Activity Tolerance: Patient tolerated treatment well;Treatment limited secondary to medical complications (Comment) Patient left: in chair;with call bell/phone within reach Nurse Communication: Mobility status PT Visit Diagnosis: Other  abnormalities of gait and mobility (R26.89);Muscle weakness (generalized) (M62.81)    Time: [id number] PT Time Calculation (min) (ACUTE ONLY): 21 min   Charges:   PT Evaluation $PT Eval Moderate Complexity: [place], PT, DPT Acute Rehabilitation Services  Personal: [facility] Office: [place] 10/08/2022, 12:34 PM

## 2022-10-08 NOTE — Discharge Instructions (Signed)
rnal Precautions After Sternotomy  A sternotomy is a surgery in which the flat bone in the middle of the chest (sternum) is cut open. After a sternotomy, sternal precautions are used to protect your sternum and the incision over the sternum while it is healing. A sternotomy is performed during surgeries that involve your heart, lungs, or blood vessels. Follow these instructions at home: Sternal precautions may be prescribed for 6 to 8 weeks after your sternotomy. Ask your health care provider how long to use precautions. Activity  Sternal precautions may include the following activity restrictions: You may have to avoid lifting. Ask your health care provider how much you can safely lift. Avoid pushing or pulling anything heavier than 5-10 lb (2.3-4.5 kg) with your upper extremities. Avoid overhead movements. Move in and out of the bed or chair using aids such as side rails or assistive devices. Hold pressure against your chest with crossed arms or a pillow when changing positions. Return to your normal activities as told by your health care provider. Ask your health care provider what activities are safe for you. Participate in physical therapy or a cardiac rehabilitation program as told by your health care provider. Physical therapy involves doing exercises to maintain movement, strengthen your muscles, and build your endurance. A cardiac rehabilitation program is a treatment program to improve your health and well-being through exercise training, education, and counseling. Stop any activity right away if you have chest pain, shortness of breath, irregular heartbeats (palpitations), or dizziness. Get help right away if you have any of these symptoms. Driving Do not drive until your health care provider says that it is safe. Ask your health care provider when you may return to work. Incision care Keep incision areas clean, dry, and protected. Check your incision area every day for signs of  infection. Check for: Redness, swelling, or pain. Fluid or blood. Warmth. Pus or a bad smell. Do not take baths, swim, or use a hot tub until your health care provider approves. Do not apply lotions, creams, or ointments unless you are told to by your health care provider. General instructions Deep breathing and coughing are essential to keep the lungs inflated and to remove mucus, which prevent complications after sternotomy. Hold pressure against your chest with crossed arms or a pillow when coughing or sneezing. Do not use any products that contain nicotine or tobacco. These products include cigarettes, chewing tobacco, and vaping devices, such as e-cigarettes. These can delay healing after surgery. If you need help quitting, ask your health care provider. Ask your health care provider when you may resume sexual activity. Where to find more information National [employer], [employer], and [employer] Institute: [employer] Contact a health care provider if: You have signs of infection. You notice any part of your incision begins to open up and separate. Get help right away if: You have chest pain. You have shortness of breath. You have palpitations. You have dizziness. These symptoms may be an emergency. Get help right away. Call 911. Do not wait to see if the symptoms will go away. Do not drive yourself to the hospital. This information is not intended to replace advice given to you by your health care provider. Make sure you discuss any questions you have with your health care provider. Document Revised: 02/19/2022 Document Reviewed: 02/19/2022 Elsevier Patient Education  2023 [employer].

## 2022-10-08 NOTE — Progress Notes (Signed)
CT surgery PM rounds  Patient's status post sternotomy and resection of mediastinal tumor with postoperative respiratory issues.  He has borderline O2 sats and increased work of breathing, low lung volumes and gastric dilatation.  Patient placed back on BiPAP this evening by RT. With normal renal function and -800 cc fluid balance will dose with Lasix this p.m.  Add IV Reglan for gastric dilatation.  Blood pressure (!) 161/84, pulse 91, temperature 99 F (37.2 C), resp. rate 18, height 5\' 8"  (1.727 m), weight 133.8 kg, SpO2 99 %.

## 2022-10-09 ENCOUNTER — Inpatient Hospital Stay (HOSPITAL_COMMUNITY): Payer: BC Managed Care – PPO

## 2022-10-09 DIAGNOSIS — J9859 Other diseases of mediastinum, not elsewhere classified: Secondary | ICD-10-CM | POA: Diagnosis not present

## 2022-10-09 LAB — BASIC METABOLIC PANEL
Anion gap: 11 (ref 5–15)
BUN: 12 mg/dL (ref 6–20)
CO2: 23 mmol/L (ref 22–32)
Calcium: 8.4 mg/dL — ABNORMAL LOW (ref 8.9–10.3)
Chloride: 100 mmol/L (ref 98–111)
Creatinine, Ser: 1.11 mg/dL (ref 0.61–1.24)
GFR, Estimated: >60 mL/min (ref >60)
Glucose, Bld: 155 mg/dL — ABNORMAL HIGH (ref 70–99)
Potassium: 4.5 mmol/L (ref 3.5–5.1)
Sodium: 134 mmol/L — ABNORMAL LOW (ref 135–145)

## 2022-10-09 LAB — GLUCOSE, CAPILLARY
Glucose-Capillary: 146 mg/dL — ABNORMAL HIGH (ref 70–99)
Glucose-Capillary: 146 mg/dL — ABNORMAL HIGH (ref 70–99)
Glucose-Capillary: 146 mg/dL — ABNORMAL HIGH (ref 70–99)
Glucose-Capillary: 160 mg/dL — ABNORMAL HIGH (ref 70–99)
Glucose-Capillary: 176 mg/dL — ABNORMAL HIGH (ref 70–99)
Glucose-Capillary: 180 mg/dL — ABNORMAL HIGH (ref 70–99)

## 2022-10-09 LAB — CBC
HCT: 35.2 % — ABNORMAL LOW (ref 39.0–52.0)
Hemoglobin: 11.4 g/dL — ABNORMAL LOW (ref 13.0–17.0)
MCH: 29.8 pg (ref 26.0–34.0)
MCHC: 32.4 g/dL (ref 30.0–36.0)
MCV: 92.1 fL (ref 80.0–100.0)
Platelets: 243 10*3/uL (ref 150–400)
RBC: 3.82 MIL/uL — ABNORMAL LOW (ref 4.22–5.81)
RDW: 14.6 % (ref 11.5–15.5)
WBC: 16.9 10*3/uL — ABNORMAL HIGH (ref 4.0–10.5)
nRBC: 0 % (ref 0.0–0.2)

## 2022-10-09 LAB — SURGICAL PATHOLOGY

## 2022-10-09 MED ORDER — FUROSEMIDE 10 MG/ML IJ SOLN
40.0000 mg | Freq: Once | INTRAMUSCULAR | Status: AC
  Administered 2022-10-09: 40 mg via INTRAVENOUS
  Filled 2022-10-09: qty 4

## 2022-10-09 MED ORDER — AMIODARONE LOAD VIA INFUSION
150.0000 mg | Freq: Once | INTRAVENOUS | Status: AC
  Administered 2022-10-09: 150 mg via INTRAVENOUS
  Filled 2022-10-09: qty 83.34

## 2022-10-09 MED ORDER — AMIODARONE HCL IN DEXTROSE 360-4.14 MG/200ML-% IV SOLN
30.0000 mg/h | INTRAVENOUS | Status: DC
  Administered 2022-10-10 (×2): 30 mg/h via INTRAVENOUS
  Filled 2022-10-09 (×2): qty 200

## 2022-10-09 MED ORDER — DEXMEDETOMIDINE HCL IN NACL 400 MCG/100ML IV SOLN
0.4000 ug/kg/h | INTRAVENOUS | Status: DC
  Administered 2022-10-09 (×2): 0.4 ug/kg/h via INTRAVENOUS
  Filled 2022-10-09 (×2): qty 100

## 2022-10-09 MED ORDER — TAMSULOSIN HCL 0.4 MG PO CAPS
0.4000 mg | ORAL_CAPSULE | Freq: Every day | ORAL | Status: DC
  Administered 2022-10-09 – 2022-10-22 (×14): 0.4 mg via ORAL
  Filled 2022-10-09 (×14): qty 1

## 2022-10-09 MED ORDER — AMIODARONE HCL IN DEXTROSE 360-4.14 MG/200ML-% IV SOLN
60.0000 mg/h | INTRAVENOUS | Status: AC
  Administered 2022-10-10: 60 mg/h via INTRAVENOUS

## 2022-10-09 MED ORDER — AMIODARONE HCL IN DEXTROSE 360-4.14 MG/200ML-% IV SOLN
INTRAVENOUS | Status: AC
  Administered 2022-10-09: 60 mg/h via INTRAVENOUS
  Filled 2022-10-09: qty 400

## 2022-10-09 MED ORDER — LISINOPRIL 2.5 MG PO TABS
2.5000 mg | ORAL_TABLET | Freq: Every day | ORAL | Status: DC
  Administered 2022-10-09 – 2022-10-22 (×14): 2.5 mg via ORAL
  Filled 2022-10-09 (×14): qty 1

## 2022-10-09 MED ORDER — CALCIUM GLUCONATE-NACL 1-0.675 GM/50ML-% IV SOLN
1.0000 g | Freq: Once | INTRAVENOUS | Status: AC
  Administered 2022-10-09: 1000 mg via INTRAVENOUS
  Filled 2022-10-09: qty 50

## 2022-10-09 MED ORDER — DEXMEDETOMIDINE HCL IN NACL 400 MCG/100ML IV SOLN: 0.0000 ug/kg/h | INTRAVENOUS | Status: DC

## 2022-10-09 NOTE — Progress Notes (Signed)
      [address]       [place] [address]             [phone]      POD # 2 resection of mediastinal mass  BP 115/74   Pulse (!) 112   Temp 99 F (37.2 C) (Oral)   Resp 20   Ht 5\' 8"  (1.727 m)   Wt 129.5 kg   SpO2 96%   BMI 43.41 kg/m   Intake/Output Summary (Last 24 hours) at 10/09/2022 1805 Last data filed at 10/09/2022 0645 Gross per 24 hour  Intake 200 ml  Output 968 ml  Net -768 ml   Respiratory status remains marginal. Off BIPAP but requiring high flow O2  10/11/2022 C. Viviann Spare, MD [facility] [phone]

## 2022-10-09 NOTE — Progress Notes (Signed)
Physical Therapy Treatment Patient Details Name: George Wallace MRN: [id number] DOB: [birth year] Today's Date: 10/09/2022   History of Present Illness Pt is a 61 y.o. 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 Pt requiring intermittent NIV post-op. 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   PT Comments    Pt with worsening respiratory status this session, significant restlessness suspect secondary to anxiety. Pt with decreased interaction during session, staring ahead without verbal interaction, though following simple commands. Pt with desaturation with bed-level activity, RN present to replaced BiPAP; pt requiring brief full support while laying flat for bed change and washup due to incontinence. Deferred OOB due to current status. Will continue to follow acutely to address established goals. Pt likely to require post-acute rehab services to maximize functional mobility and independence prior to return home pending progression in activity tolerance.    Recommendations for follow up therapy are one component of a multi-disciplinary discharge planning process, led by the attending physician.  Recommendations may be updated based on patient status, additional functional criteria and insurance authorization.  Follow Up Recommendations  Acute inpatient rehab (3hours/day) - pending progression     Assistance Recommended at Discharge Frequent or constant Supervision/Assistance  Patient can return home with the following A little help with walking and/or transfers;A little help with bathing/dressing/bathroom;Assistance with cooking/housework;Assist for transportation;Help with stairs or ramp for entrance   Equipment Recommendations  Other (comment) (TBD)    Recommendations for Other Services        Precautions / Restrictions Precautions Precautions: Fall;Sternal;Other (comment) Precaution Comments: chest tube; RN placed on bipap during session due to SpO2 maintaining 83% on O2 [name]; restless with anxiety, RN present     Mobility  Bed Mobility Overal bed mobility: Needs Assistance Bed Mobility: Rolling Rolling: Mod assist, +2 for safety/equipment         General bed mobility comments: modA to roll R/L for pericare, washup and linen change, pt assisting with BLEs though restless requiring intermittent assist to maintain sidelying, required UE positioned on bed rail and cues to hold. assist+2 to slide up in bed, pt scooting himself back down once [name] elevated    Transfers                   General transfer comment: deferred secondary to respiratory status    Ambulation/Gait                   Stairs             Wheelchair Mobility    Modified Rankin (Stroke Patients Only)       Balance Overall balance assessment: Needs assistance                                          Cognition Arousal/Alertness: Awake/alert Behavior During Therapy: Anxious Overall Cognitive Status: Impaired/Different from baseline                                 General Comments: pt staring blankly with wide eyes, not verbally responsive to questions though following simple commands; pt restless requiring redirection, suspect anxiety related to SOB. RN placed back on  bipap during sesssion        Exercises      General Comments General comments (skin integrity, edema, etc.): pt with tachypnea and desaturation with bed level activity, RN placed back on bipap. pt with urine incontinence, dependent for pericare/washup; pt requiring full support briefly on NIV (by RN) to maintain saturations with bed level activity      Pertinent Vitals/Pain Pain Assessment Pain Assessment: Faces Faces Pain Scale: Hurts little more Pain Location:  generalized with movement (unable to specify) Pain Descriptors / Indicators: Grimacing Pain Intervention(s): Monitored during session    Home Living                          Prior Function            PT Goals (current goals can now be found in the care plan section) Progress towards PT goals: Not progressing toward goals - comment (limited by cardiopulmonary status this session)    Frequency    Min 3X/week      PT Plan Discharge plan needs to be updated    Co-evaluation              AM-PAC PT "6 Clicks" Mobility   Outcome Measure  Help needed turning from your back to your side while in a flat bed without using bedrails?: A Lot Help needed moving from lying on your back to sitting on the side of a flat bed without using bedrails?: A Lot Help needed moving to and from a bed to a chair (including a wheelchair)?: A Lot Help needed standing up from a chair using your arms (e.g., wheelchair or bedside chair)?: A Lot Help needed to walk in hospital room?: A Lot Help needed climbing 3-5 steps with a railing? : A Lot 6 Click Score: 12    End of Session Equipment Utilized During Treatment: Oxygen Activity Tolerance: Treatment limited secondary to medical complications (Comment) Patient left: in bed;with call bell/phone within reach;with bed alarm set Nurse Communication: Mobility status PT Visit Diagnosis: Other abnormalities of gait and mobility (R26.89);Muscle weakness (generalized) (M62.81)     Time: 1050-1110 PT Time Calculation (min) (ACUTE ONLY): 20 min  Charges:  $Therapeutic Activity: 8-22 mins                      George Wallace, PT, DPT Acute Rehabilitation Services  Personal: [facility] Office: [phone]  George Wallace 10/09/2022, 12:40 PM

## 2022-10-09 NOTE — Progress Notes (Signed)
   NAME:  George Wallace, MRN:  [id number], DOB:  [birth year], LOS: 2 ADMISSION DATE:  10/07/2022 CONSULTATION DATE:  12/11 REFERRING MD:  Dr. Cliffton Asters CHIEF COMPLAINT:  resection of mediastinal mass and sternotomy  History of Present Illness:  Patient is a 61 year old male with pertinent PMH of DMT2, HTN, HLD presents to [facility] on 12/11 for resection of mediastinal mass and sternotomy.  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  Biopsies were performed at [facility] that were inconclusive.  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  On 12/11 patient arrived to [facility] for elective surgery.  Resection of mediastinal mass was performed. 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  It was taken en bloc.  Required sternotomy to anatomy at the vein with cessation of bleeding.  Postop transferred to ICU intubated.  PCCM consulted.  Pertinent Medical History:   Past Medical History:  Diagnosis Date   Diabetes (HCC)    Diet controlled   Hyperlipidemia      Significant Hospital Events: Including procedures, antibiotic start and stop dates in addition to other pertinent events   12/11 - s/p sternotomy and resection of mediastinal mass; post op intubated  Interim History / Subjective:  Still a bit tenuous, anxious.  Objective:  Blood pressure (!) 162/89, pulse (!) 118, temperature 99.5 F (37.5 C), temperature source Axillary, resp. rate 18, height 5\' 8"  (1.727 m), weight 129.5 kg, SpO2 94 %.    Vent Mode: BIPAP FiO2 (%):  [30 %] 30 % Set Rate:  [8 bmp] 8 bmp PEEP:  [5 cmH20] 5 cmH20 Pressure Support:  [5 cmH20] 5 cmH20   Intake/Output Summary (Last 24 hours) at 10/09/2022 1028 Last data filed at 10/09/2022 0645 Gross per 24 hour  Intake 200 ml  Output 1608 ml  Net -1408 ml    Filed Weights   10/07/22 0617 10/08/22 0500 10/09/22 0500   Weight: 129.3 kg 133.8 kg 129.5 kg    Physical Examination: Anxious, tachypneic on BIPAP Crackles bases Ext warm Moves to command Minimal edema Drains in place  BMP stable CBC stable Gaseous distension abd on CXR, low lung volumes, L hemidiaphragm elevation stable  Resolved Hospital Problem List:    Assessment & Plan:    S/p resection of mediastinal mass and sternotomy Postoperative vent management- resolved Postoperative hypoxemia- related to splinting and L phrenic nerve palsy - Redose lasix - Mobilization, PT/OT - Drain management per primary - Still needing BIPAP qHS and PRN Also very anxious, tachypneic, not sure he is ready to leave ICU with current clinical appearance  DMT2 P: - SSI  HTN HLD P: -cont statin  Will follow while in ICU  10/11/22 MD PCCM

## 2022-10-09 NOTE — Progress Notes (Signed)
   Inpatient Rehab Admissions Coordinator :  Per therapy recommendations patient was screened for CIR candidacy by Sir Mallis RN MSN. Patient is not yet at a level to tolerate the intensity required to pursue a CIR admit . The CIR admissions team will follow and monitor for progress and place a Rehab Consult order if felt to be appropriate. Please contact me with any questions.  Shery Wauneka RN MSN Admissions Coordinator [phone]  

## 2022-10-09 NOTE — Discharge Summary (Signed)
[address]       [place] [address]             [phone]    Physician Discharge Summary  Patient ID: George Wallace MRN: [id number] DOB/AGE: 1961/02/23 61 y.o.  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   Type 2 diabetes mellitus without complication, without long-term current use of insulin ([place]) 10/07/2022   Hardening of the aorta (main artery of the heart) ([place]) 06/07/2022   Excessive sleepiness 06/07/2022   Hyperlipidemia, unspecified 06/07/2022   Inattention 06/07/2022   Obstructive sleep apnea (adult) (pediatric) 06/07/2022   Personal history of colonic polyps 06/07/2022   Type 2 diabetes mellitus with other specified complication ([place]) 18/56/3149   Vitamin D deficiency 06/07/2022   Mediastinal mass 05/31/2022   Intermittent asthma without complication 70/26/3785   Cardiomegaly 12/06/2014   Wheeze 12/06/2014   Morbid obesity ([place]) 12/06/2014     Discharge Diagnoses:  Patient Active Problem List   Diagnosis Date Noted   Acute respiratory failure ([place]) 10/07/2022   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   Type 2 diabetes mellitus without complication, without long-term current use of insulin ([place]) 10/07/2022   Hardening of the aorta (main artery of the heart) ([place]) 06/07/2022   Excessive sleepiness 06/07/2022   Hyperlipidemia, unspecified 06/07/2022   Inattention 06/07/2022   Obstructive sleep apnea (adult) (pediatric) 06/07/2022   Personal history of colonic polyps 06/07/2022   Type 2 diabetes mellitus with other specified complication ([place]) 88/50/2774   Vitamin D deficiency 06/07/2022   Mediastinal mass 05/31/2022   Intermittent asthma without complication 12/87/8676   Cardiomegaly 12/06/2014   Wheeze 12/06/2014   Morbid obesity ([place]) 12/06/2014     Discharged  Condition: stable  HPI: This is a 61 y.o. 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  He was originally undergoing a CT scan for coronary calcium score, and a nodular mass was identified.  He does admit to some fatigue over the past several months.  He also has some occasional musculoskeletal chest pain when he leans over.  He denies any muscle weakness or neurologic symptoms.  His weight has been stable.  He occasionally has some night sweats. He returned to the office on 09/27/2022 to discuss surgical plans for resection of his anterior mediastinal mass. 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 Biopsies at [facility] were nonconclusive. 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  Dr. Kipp Brood discussed potential risks, benefits, and complications of the surgery (robotic assisted resection of the mediastinal mass). He discussed that we would start on the right side and move then to the left side, due to the size of the mass.  Hospital Course: Patient underwent Xi robotic assisted right and left thoracoscopy with resection of anterior mediastinal mass followed by conversion to median sternotomy. 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 It was taken en bloc. Patient was transported from the OR to [facility] ICU intubated. Pulmonary/CCm evaluated patient. Patient was extubated early evening of 12/11. A line was removed. He was on bi pap. Chest tubes had minor output and were to suction. Chest tubes were removed on 12/13. He was restarted on Lisinopril for hypertension. He was put  on bi pap (had been on HFNC during day) for post op hypoxemia. He was give Lasix. He went into a fib with RVR late evening of 12/13 and was put on a Amiodarone drip. He converted to sinus rhythm. He was able to get to a chair and tolerate HFNC during  the day. He required bi pap at night. Of note, he likely has undiagnosed OSA. He was given Glucerna for additional nutrition as was only on clear liquid diet.  It is noted that the left phrenic nerve was involved with the mass and the left diaphragm is noted to be elevated.  The right is also elevated but not to the same degree.  He continued to work on aggressive pulmonary hygiene with BiPAP use as needed.  He has some very significant physical deconditioning and PT/OT consultations have been obtained to assist with rehabilitation modalities.  Blood sugars are somewhat difficult to control and he has been restarted on his Metformin. Patient's pulmonary status (ARF with hypoxia and post op hypoxemia) continued to improve. He is now on a [place] during the day and still requires bi pap at night. Garlon Hatchet and Pulmicort inhalers were continued. Lopressor was titrated accordingly for hypertension. He was started on Flomax. He was surgically stable for transfer from the ICU to [facility] for further convalescence on 12/21. Per pulmonary, he will need at home bi pap. He has been weaned to room air during the day. Compression stockings and encouragement to elevate legs when not ambulating as has had LE swelling bilaterally. It was recommended he follow up with pulmonary for CO2 retention. However, per pulmonary, without BiPAP the patient is at risk of serious medical complicated including, but not limited to, death.  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 High risk for developing hypercapnia long-term despite not having it now. Home BiPAP was arranged for patient prior to discharge. He will need to follow up with pulmonary/sleep medicine after discharge. Patient instructed to sleep sitting upright due to diaphragm weakness as much as able.  Consults: pulmonary/intensive care  Significant Diagnostic Studies:  Narrative & Impression  CLINICAL DATA:  Recent mediastinal surgery, atelectasis,  shortness of breath   EXAM: PORTABLE CHEST 1 VIEW   COMPARISON:  Portable exam 0512 hours compared to 10/13/2022   FINDINGS: Enlargement of cardiac silhouette and prominent mediastinum post median sternotomy, stable.   Decreased lung volumes with bibasilar atelectasis.   Trace LEFT pleural effusion.   No pneumothorax or acute osseous findings.   IMPRESSION: Low lung volumes with bibasilar atelectasis and trace LEFT pleural effusion.     Electronically Signed   By: Lavonia Dana M.D.   On: 10/14/2022 08:24     Narrative & Impression  CLINICAL DATA:  Pneumothorax.   EXAM: PORTABLE CHEST 1 VIEW   COMPARISON:  10/03/2022 and CT chest 09/27/2022.   FINDINGS: Endotracheal tube terminates approximately 3 cm above the carina. Heart size is accentuated by AP semi upright low volume technique. Bilateral chest tubes in place. No definite pneumothorax. Central pulmonary vascular congestion. Mild bibasilar atelectasis. Subcutaneous emphysema along the chest wall bilaterally.   IMPRESSION: 1. No definite pneumothorax with bilateral chest tubes in place. 2. Low lung volumes with central pulmonary vascular congestion and bibasilar atelectasis.     Electronically Signed   By: Lorin Picket M.D.   On: 10/07/2022 15:18    Treatments: surgery:   Robotic assisted right and left video thoracoscopy -Median sternotomy -Repair of innominate vein - Intercostal nerve block  by Dr. Kipp Brood on 10/07/2022.  Pathology:   SURGICAL PATHOLOGY CASE: [id number] PATIENT: [facility] Report  Clinical History: mediastinal mass (cm)  FINAL MICROSCOPIC DIAGNOSIS:  A. 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 Lymph nodes with reactive lymphoid hyperplasia. Negative for malignancy. See comment.  COMMENT: There are multiple cysts lined by attenuated benign squamous epithelium surrounded by thymic  tissue showing florid reactive lymphoid hyperplasia.  There are also lymph nodes present which show reactive hyperplasia.  Flow cytometry performed on the specimen ([id number]) does not show a monoclonal B-cell population or abnormal T-cell phenotype.  Dr. Gari Crown reviewed this case and agrees.    Discharge Exam: Blood pressure 110/61, pulse 68, temperature 97.9 F (36.6 C), temperature source Oral, resp. rate 19, height _0  (1.727 m), weight 125.1 kg, SpO2 98 %.      Discharge Medications:  Cardiovascular: RRR Pulmonary: Clear to auscultation bilaterally Abdomen: Soft, non tender, bowel sounds present. Extremities: Mild bilateral lower extremity edema. Compression stockings in place Wounds: Clean and dry.  No erythema or signs of infection.   Discharge Instructions     Amb Referral to Cardiac Rehabilitation   Complete by: As directed    Diagnosis: Other   After initial evaluation and assessments completed: Virtual Based Care may be provided alone or in conjunction with Phase 2 Cardiac Rehab based on patient barriers.: Yes   Intensive Cardiac Rehabilitation (ICR) [place] location only OR Traditional Cardiac Rehabilitation (TCR) *If criteria for ICR are not met will enroll in TCR [facility] only): Yes   Discharge patient   Complete by: As directed    Discharge disposition: 01-Home or Self Care   Discharge patient date: 10/22/2022      Allergies as of 10/22/2022   No Known Allergies      Medication List     TAKE these medications    acetaminophen 500 MG tablet Commonly known as: TYLENOL Take 1,000 mg by mouth every 6 (six) hours as needed for moderate pain.   AeroChamber Plus Flo-Vu Large Misc 1 each by Other route once.   albuterol 108 (90 Base) MCG/ACT inhaler Commonly known as: VENTOLIN HFA Inhale 2 puffs into the lungs every 4 (four) hours as needed for wheezing.   amiodarone 200 MG tablet Commonly known as: PACERONE Take 1 tablet (200 mg total) by mouth 2 (two)  times daily.   aspirin EC 325 MG tablet Take 1 tablet (325 mg total) by mouth daily.   cetirizine 10 MG tablet Commonly known as: ZYRTEC Take 10 mg by mouth daily as needed for allergies.   lisinopril 2.5 MG tablet Commonly known as: ZESTRIL Take 1 tablet (2.5 mg total) by mouth daily. Start taking on: October 23, 2022 What changed: how much to take   metFORMIN 500 MG 24 hr tablet Commonly known as: GLUCOPHAGE-XR Take 500 mg by mouth every morning.   metoprolol succinate 25 MG 24 hr tablet Commonly known as: TOPROL-XL Take 3 tablets (75 mg total) by mouth daily. Start taking on: October 23, 2022   oxyCODONE 5 MG immediate release tablet Commonly known as: Oxy IR/ROXICODONE Take 1 tablet (5 mg total) by mouth every 6 (six) hours as needed for up to 7 days for severe pain.   rosuvastatin 10 MG tablet Commonly known as: CRESTOR Take 10 mg by mouth daily.   tamsulosin 0.4 MG Caps capsule Commonly known as: FLOMAX Take 1 capsule (0.4 mg total) by mouth daily. Start taking on: October 23, 2022               Durable Medical Equipment  (From admission, onward)           Start     Ordered   10/22/22 1126  For home use only DME 4 wheeled rolling walker with seat  Once       Comments: Due to body habitus patient needs bariatric rollator. Patient unable to fit in standard size rollator seat Height 5'8" Weight 275 BMI 42  Question:  Patient needs a walker to treat with the following condition  Answer:  Paralysis of diaphragm   10/22/22 1127   10/19/22 1259  For home use only DME Bipap  Once       Question Answer Comment  Length of Need Lifetime   Inspiratory pressure 12   Expiratory pressure 5      10/19/22 1303   10/18/22 0937  For home use only DME Hospital bed  Once       Comments: J98.6  Question Answer Comment  Length of Need 12 Months   Patient has (list medical condition): Left hemidiaphragm paralysis   The above medical condition requires: Patient  requires the ability to reposition frequently   Head must be elevated greater than: 45 degrees   Bed type Semi-electric   Trapeze Bar Yes   Support Surface: Gel Overlay      10/18/22 9030            Follow-up Information     Lajuana Matte, MD. Go on 10/18/2022.   Specialty: Cardiothoracic Surgery Why: Appointment time is at 10:10 am Contact information: [address] [place] [address] [phone]         [facility] Follow up.   Why: BiPAP for home Contact information: [place] [address] [phone]                 Signed:  John Giovanni, PA-C  10/22/2022, 4:02 PM

## 2022-10-09 NOTE — Progress Notes (Addendum)
TCTS DAILY ICU PROGRESS NOTE                   [address]            [place] [address]          [phone]   2 Days Post-Op Procedure(s) (LRB): XI ROBOTIC ASSISTED BILATERAL RESECTION OF MEDIASTINAL MASS  AND STERNOTOMY (Bilateral) INTERCOSTAL NERVE BLOCK  Total Length of Stay:  LOS: 2 days   Subjective: Patient awake, alert on bi pap this am.  Objective: Vital signs in last 24 hours: Temp:  [98.2 F (36.8 C)-99 F (37.2 C)] 98.6 F (37 C) (12/13 0348) Pulse Rate:  [78-117] 117 (12/13 0600) Cardiac Rhythm: Sinus tachycardia (12/13 0400) Resp:  [12-30] 25 (12/13 0600) BP: (117-181)/(72-103) 165/93 (12/13 0600) SpO2:  [87 %-100 %] 88 % (12/13 0600) Arterial Line BP: (126-136)/(64-73) 126/73 (12/12 0820) FiO2 (%):  [30 %] 30 % (12/12 2315) Weight:  [129.5 kg] 129.5 kg (12/13 0500)  Filed Weights   10/07/22 0617 10/08/22 0500 10/09/22 0500  Weight: 129.3 kg 133.8 kg 129.5 kg    Weight change: -4.3 kg      Intake/Output from previous day: 12/12 0701 - 12/13 0700 In: 200 [IV Piggyback:200] Out: 1690 [Urine:1412; Chest Tube:278]  Intake/Output this shift: No intake/output data recorded.  Current Meds: Scheduled Meds:  acetaminophen  1,000 mg Oral Q6H   Or   acetaminophen (TYLENOL) oral liquid 160 mg/5 mL  1,000 mg Per Tube Q6H   acetaminophen (TYLENOL) oral liquid 160 mg/5 mL  650 mg Per Tube Once   Or   acetaminophen  650 mg Rectal Once   aspirin EC  325 mg Oral Daily   Or   aspirin  324 mg Per Tube Daily   bisacodyl  10 mg Oral Daily   Or   bisacodyl  10 mg Rectal Daily   Chlorhexidine Gluconate Cloth  6 each Topical Daily   docusate sodium  200 mg Oral Daily   enoxaparin (LOVENOX) injection  40 mg Subcutaneous Daily   insulin aspart  0-24 Units Subcutaneous Q4H   metoCLOPramide (REGLAN) injection  10 mg Intravenous Q6H   mouth rinse  15 mL Mouth Rinse 4 times per day   rosuvastatin  10 mg Oral QPM   sodium chloride flush  3 mL  Intravenous Q12H   Continuous Infusions:  sodium chloride     sodium chloride     sodium chloride     lactated ringers     lactated ringers     lactated ringers     phenylephrine (NEO-SYNEPHRINE) Adult infusion Stopped (10/08/22 0215)   PRN Meds:.sodium chloride, albuterol, dextrose, lactated ringers, metoprolol tartrate, morphine injection, ondansetron (ZOFRAN) IV, mouth rinse, oxyCODONE, sodium chloride flush, traMADol  General appearance: alert, cooperative, and no distress Neurologic: intact Heart: Tachycardic Lungs: Slightly diminished bibasilar breath sounds Abdomen: Soft, non tender, occasional bowel sounds Extremities: SCDs in place Wound: Aquacel intact. Other wounds are clean and dry  Lab Results: CBC: Recent Labs    10/08/22 1609 10/09/22 0152  WBC 16.8* 16.9*  HGB 11.5* 11.4*  HCT 35.4* 35.2*  PLT 234 243    BMET:  Recent Labs    10/08/22 1609 10/09/22 0152  NA 134* 134*  K 5.5* 4.5  CL 101 100  CO2 23 23  GLUCOSE 161* 155*  BUN 11 12  CREATININE 0.93 1.11  CALCIUM 8.1* 8.4*     CMET: Lab Results  Component Value  Date   WBC 16.9 (H) 10/09/2022   HGB 11.4 (L) 10/09/2022   HCT 35.2 (L) 10/09/2022   PLT 243 10/09/2022   GLUCOSE 155 (H) 10/09/2022   ALT 23 10/03/2022   AST 22 10/03/2022   NA 134 (L) 10/09/2022   K 4.5 10/09/2022   CL 100 10/09/2022   CREATININE 1.11 10/09/2022   BUN 12 10/09/2022   CO2 23 10/09/2022   INR 1.2 10/07/2022   HGBA1C 6.9 (H) 10/03/2022      PT/INR:  Recent Labs    10/07/22 1539  LABPROT 14.7  INR 1.2    Radiology: DG CHEST PORT 1 VIEW  Result Date: 10/09/2022 CLINICAL DATA:  Chest tube present s/p mediastinal mass surg, sob EXAM: PORTABLE CHEST - 1 VIEW COMPARISON:  the previous day's study FINDINGS: Low lung volumes with perihilar and suprahilar airspace opacity slightly increased. Bilateral chest drains in place. No pneumothorax. The right lateral subcutaneous emphysema seen previously has  resolved. Persistent blunting of the right lateral costophrenic angle. Heart size normal. Sternotomy wires. IMPRESSION: Low volumes with increasing perihilar and suprahilar airspace disease. Electronically Signed   By: Lucrezia Europe M.D.   On: 10/09/2022 06:48     Assessment/Plan: S/P Procedure(s) (LRB): XI ROBOTIC ASSISTED BILATERAL RESECTION OF MEDIASTINAL MASS  AND STERNOTOMY (Bilateral) INTERCOSTAL NERVE BLOCK  CV-SR. Will restart Lisinopril as hypertensive Pulmonary-Pulmonary/CCM following;hope to wean off bi pap. Chest tubes to suction. Chest tubes with 278 since surgery. No air leak. CXR this am shows low lung volumes and right lateral subcutaneous emphysema. Hope to remove chest tubes. Encourage incentive spirometer and flutter valve. Expected post op blood loss anemia-H and H this am stable at 11.4 and 35.2 4. DM-CBGs 172/174/146. Will restart Metformin once tolerating a diet well. Insulin PRN for now. 5. Leukocytosis-WBC this am is 16,900. Likely related to SIRS. Remains afebrile 6.  Hope to transfer soon  Nani Skillern PA-C 10/09/2022 7:28 AM   Agree with above Requires bipap Treating anxiety Will watch in [place]

## 2022-10-09 NOTE — Progress Notes (Signed)
      [address]       [place] [address]             [phone]      Notified by RN that Mr. Selvidge having increased work of breathing.   Now in atrial fibrillation with RVR.  Breathing issues preceded A fib.  He is using accessory muscles.  Lungs are clear with no wheezing but very diminished.  Will start amiodarone drip per protocol for atrial fibrillation.  BIPAP for acute respiratory failure.  Salvatore Decent Dorris Fetch, MD [facility] [phone]

## 2022-10-10 ENCOUNTER — Inpatient Hospital Stay (HOSPITAL_COMMUNITY): Payer: BC Managed Care – PPO

## 2022-10-10 LAB — CBC
HCT: 30.5 % — ABNORMAL LOW (ref 39.0–52.0)
Hemoglobin: 10.1 g/dL — ABNORMAL LOW (ref 13.0–17.0)
MCH: 30.7 pg (ref 26.0–34.0)
MCHC: 33.1 g/dL (ref 30.0–36.0)
MCV: 92.7 fL (ref 80.0–100.0)
Platelets: 223 10*3/uL (ref 150–400)
RBC: 3.29 MIL/uL — ABNORMAL LOW (ref 4.22–5.81)
RDW: 14.2 % (ref 11.5–15.5)
WBC: 13.5 10*3/uL — ABNORMAL HIGH (ref 4.0–10.5)
nRBC: 0 % (ref 0.0–0.2)

## 2022-10-10 LAB — BASIC METABOLIC PANEL
Anion gap: 7 (ref 5–15)
BUN: 15 mg/dL (ref 6–20)
CO2: 31 mmol/L (ref 22–32)
Calcium: 8.3 mg/dL — ABNORMAL LOW (ref 8.9–10.3)
Chloride: 98 mmol/L (ref 98–111)
Creatinine, Ser: 1.01 mg/dL (ref 0.61–1.24)
GFR, Estimated: >60 mL/min (ref >60)
Glucose, Bld: 151 mg/dL — ABNORMAL HIGH (ref 70–99)
Potassium: 5 mmol/L (ref 3.5–5.1)
Sodium: 136 mmol/L (ref 135–145)

## 2022-10-10 LAB — GLUCOSE, CAPILLARY
Glucose-Capillary: 125 mg/dL — ABNORMAL HIGH (ref 70–99)
Glucose-Capillary: 141 mg/dL — ABNORMAL HIGH (ref 70–99)
Glucose-Capillary: 132 mg/dL — ABNORMAL HIGH (ref 70–99)
Glucose-Capillary: 132 mg/dL — ABNORMAL HIGH (ref 70–99)
Glucose-Capillary: 125 mg/dL — ABNORMAL HIGH (ref 70–99)
Glucose-Capillary: 151 mg/dL — ABNORMAL HIGH (ref 70–99)

## 2022-10-10 MED ORDER — ORAL CARE MOUTH RINSE: 15.0000 mL | OROMUCOSAL | Status: DC | PRN

## 2022-10-10 MED ORDER — METHOCARBAMOL 1000 MG/10ML IJ SOLN
500.0000 mg | Freq: Three times a day (TID) | INTRAVENOUS | Status: AC
  Administered 2022-10-10 – 2022-10-11 (×6): 500 mg via INTRAVENOUS
  Filled 2022-10-10: qty 500
  Filled 2022-10-10 (×5): qty 5

## 2022-10-10 MED ORDER — SENNOSIDES-DOCUSATE SODIUM 8.6-50 MG PO TABS
1.0000 | ORAL_TABLET | Freq: Every day | ORAL | Status: DC
  Administered 2022-10-10 – 2022-10-20 (×9): 1 via ORAL
  Filled 2022-10-10 (×12): qty 1

## 2022-10-10 MED ORDER — FUROSEMIDE 10 MG/ML IJ SOLN
40.0000 mg | Freq: Four times a day (QID) | INTRAMUSCULAR | Status: AC
  Administered 2022-10-10 (×2): 40 mg via INTRAVENOUS
  Filled 2022-10-10 (×2): qty 4

## 2022-10-10 MED ORDER — ORAL CARE MOUTH RINSE
15.0000 mL | OROMUCOSAL | Status: DC
  Administered 2022-10-10 – 2022-10-15 (×19): 15 mL via OROMUCOSAL

## 2022-10-10 MED ORDER — POLYETHYLENE GLYCOL 3350 17 G PO PACK
17.0000 g | PACK | Freq: Every day | ORAL | Status: DC
  Administered 2022-10-10 – 2022-10-15 (×6): 17 g via ORAL
  Filled 2022-10-10 (×12): qty 1

## 2022-10-10 MED ORDER — SODIUM CHLORIDE 0.9 % IV SOLN: INTRAVENOUS | Status: DC | PRN

## 2022-10-10 NOTE — Progress Notes (Addendum)
TCTS DAILY ICU PROGRESS NOTE                   [place]            [place] [address]          [phone]   3 Days Post-Op Procedure(s) (LRB): XI ROBOTIC ASSISTED BILATERAL RESECTION OF MEDIASTINAL MASS  AND STERNOTOMY (Bilateral) INTERCOSTAL NERVE BLOCK  Total Length of Stay:  LOS: 3 days   Subjective: Patient sleeping and awakened.  Objective: Vital signs in last 24 hours: Temp:  [98.1 F (36.7 C)-99.5 F (37.5 C)] 98.5 F (36.9 C) (12/14 0336) Pulse Rate:  [64-159] 98 (12/14 0600) Cardiac Rhythm: Normal sinus rhythm (12/14 0400) Resp:  [14-27] 18 (12/14 0600) BP: (70-162)/(48-95) 106/48 (12/14 0600) SpO2:  [43 %-100 %] 96 % (12/14 0600) FiO2 (%):  [30 %-85 %] 50 % (12/14 0404) Weight:  [130.8 kg] 130.8 kg (12/14 0500)  Filed Weights   10/08/22 0500 10/09/22 0500 10/10/22 0500  Weight: 133.8 kg 129.5 kg 130.8 kg    Weight change: 1.3 kg      Intake/Output from previous day: 12/13 0701 - 12/14 0700 In: 244.2 [I.V.:244.2] Out: -   Intake/Output this shift: No intake/output data recorded.  Current Meds: Scheduled Meds:  acetaminophen  1,000 mg Oral Q6H   Or   acetaminophen (TYLENOL) oral liquid 160 mg/5 mL  1,000 mg Per Tube Q6H   acetaminophen (TYLENOL) oral liquid 160 mg/5 mL  650 mg Per Tube Once   Or   acetaminophen  650 mg Rectal Once   aspirin EC  325 mg Oral Daily   Or   aspirin  324 mg Per Tube Daily   bisacodyl  10 mg Oral Daily   Or   bisacodyl  10 mg Rectal Daily   Chlorhexidine Gluconate Cloth  6 each Topical Daily   docusate sodium  200 mg Oral Daily   enoxaparin (LOVENOX) injection  40 mg Subcutaneous Daily   insulin aspart  0-24 Units Subcutaneous Q4H   lisinopril  2.5 mg Oral Daily   metoCLOPramide (REGLAN) injection  10 mg Intravenous Q6H   mouth rinse  15 mL Mouth Rinse 4 times per day   rosuvastatin  10 mg Oral QPM   sodium chloride flush  3 mL Intravenous Q12H   tamsulosin  0.4 mg Oral Daily   Continuous  Infusions:  sodium chloride     sodium chloride     sodium chloride     amiodarone 30 mg/hr (10/10/22 0630)   dexmedetomidine (PRECEDEX) IV infusion Stopped (10/09/22 2340)   lactated ringers     lactated ringers     PRN Meds:.sodium chloride, albuterol, dextrose, metoprolol tartrate, morphine injection, ondansetron (ZOFRAN) IV, mouth rinse, oxyCODONE, sodium chloride flush, traMADol  General appearance: alert, cooperative, and no distress Neurologic: intact Heart: RRR Lungs: Slightly diminished bibasilar breath sounds Abdomen: Soft, non tender, occasional bowel sounds Extremities: SCDs in place Wound: Aquacel intact. Other wounds are clean and dry Chest tubes: to suction  Lab Results: CBC: Recent Labs    10/09/22 0152 10/10/22 0434  WBC 16.9* 13.5*  HGB 11.4* 10.1*  HCT 35.2* 30.5*  PLT 243 223    BMET:  Recent Labs    10/09/22 0152 10/10/22 0434  NA 134* 136  K 4.5 5.0  CL 100 98  CO2 23 31  GLUCOSE 155* 151*  BUN 12 15  CREATININE 1.11 1.01  CALCIUM 8.4* 8.3*  CMET: Lab Results  Component Value Date   WBC 13.5 (H) 10/10/2022   HGB 10.1 (L) 10/10/2022   HCT 30.5 (L) 10/10/2022   PLT 223 10/10/2022   GLUCOSE 151 (H) 10/10/2022   ALT 23 10/03/2022   AST 22 10/03/2022   NA 136 10/10/2022   K 5.0 10/10/2022   CL 98 10/10/2022   CREATININE 1.01 10/10/2022   BUN 15 10/10/2022   CO2 31 10/10/2022   INR 1.2 10/07/2022   HGBA1C 6.9 (H) 10/03/2022      PT/INR:  Recent Labs    10/07/22 1539  LABPROT 14.7  INR 1.2    Radiology: No results found.   Assessment/Plan: S/P Procedure(s) (LRB): XI ROBOTIC ASSISTED BILATERAL RESECTION OF MEDIASTINAL MASS  AND STERNOTOMY (Bilateral) INTERCOSTAL NERVE BLOCK  CV-A fib with RVR late last evening. SR this am. On Amiodarone drip. On low dose Lisinopril as taken pre op. Likely transition off Amiodarone drip. Pulmonary-Had tachypnea and use of accessory muscles last evening. Pulmonary/CCM following. On  bi pap this am. Chest tube output this am at 670 on Pleura Vac. Chest tubes to suction, no air leak. CXR this am shows patient rotated to the left, low lung volumes and  perihilar/suprahilar airspace disease. Encourage incentive spirometer and flutter valve. Expected post op blood loss anemia-H and H this am decreased to 10.1 and 30.5 4. DM-CBGs 160/146/125. Will restart Metformin once tolerating a diet well. Insulin PRN for now. 5. Leukocytosis-WBC this am is decreased to 13,500. Likely related to SIRS. Remains afebrile 6. On Lovenox for DVT prophylaxis 7. GI-on clear liquid diet. Will discuss with Dr. Cliffton Asters about advancement  Ardelle Balls PA-C 10/10/2022 7:02 AM    Afib overnight, back in sinus Tolerated HF yesterday, back on Bipap overnight Will attempt to get to a chair today Antwoin Lackey Keane [name]

## 2022-10-10 NOTE — Progress Notes (Signed)
Patient ID: George Wallace, male   DOB: 07/14/1961, 61 y.o.   MRN: [id number]  TCTS Evening Rounds:  Hemodynamically stable in sinus rhythm on IV amio.  Remains on bipap. Did not tolerate HFNC today. Sats 100%. Was up in chair most of the day.  UO good. CT output low.

## 2022-10-10 NOTE — Progress Notes (Signed)
RT placed pt on BIPAP due to increased WOB at this time. HHFNC at bedside on standby if needed.

## 2022-10-10 NOTE — Progress Notes (Signed)
   NAME:  George Wallace, MRN:  [id number], DOB:  [birth year], LOS: 3 ADMISSION DATE:  10/07/2022 CONSULTATION DATE:  12/11 REFERRING MD:  Dr. Cliffton Asters CHIEF COMPLAINT:  resection of mediastinal mass and sternotomy  History of Present Illness:  Patient is a 61 year old male with pertinent PMH of DMT2, HTN, HLD presents to [facility] on 12/11 for resection of mediastinal mass and sternotomy.  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  Biopsies were performed at [facility] that were inconclusive.  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  On 12/11 patient arrived to [facility] for elective surgery.  Resection of mediastinal mass was performed. 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  It was taken en bloc.  Required sternotomy to anatomy at the vein with cessation of bleeding.  Postop transferred to ICU intubated.  PCCM consulted.  Pertinent Medical History:   Past Medical History:  Diagnosis Date   Diabetes (HCC)    Diet controlled   Hyperlipidemia      Significant Hospital Events: Including procedures, antibiotic start and stop dates in addition to other pertinent events   12/11 - s/p sternotomy and resection of mediastinal mass; post op intubated 12/12 extubated 12/13 BIPAP intermittent 12/14 Afib on amio  Interim History / Subjective:  Still having a tough time coming off BIPAP.  Objective:  Blood pressure (!) 146/78, pulse (!) 105, temperature 98.5 F (36.9 C), temperature source Axillary, resp. rate (!) 25, height 5\' 8"  (1.727 m), weight 130.8 kg, SpO2 91 %.    Vent Mode: BIPAP FiO2 (%):  [30 %-85 %] 50 % Set Rate:  [8 bmp] 8 bmp Pressure Support:  [5 cmH20] 5 cmH20   Intake/Output Summary (Last 24 hours) at 10/10/2022 0734 Last data filed at 10/10/2022 0000 Gross per 24 hour  Intake 244.24 ml  Output --  Net 244.24 ml     Filed Weights   10/08/22 0500 10/09/22 0500 10/10/22 0500  Weight: 133.8 kg 129.5 kg 130.8 kg    Physical Examination: No distress resting on BIPAP Breath sounds reduced particularly on left Abd soft Ext warm RASS -1 AOx3  BMP stable WBC improved Stable anemia CXR still looks wet +/- atelectasis  Resolved Hospital Problem List:    Assessment & Plan:   S/p resection of mediastinal mass and sternotomy Postoperative vent management- resolved Postoperative hypoxemia- related to splinting and L phrenic nerve palsy, edema - Push diuresis - Mobilization, PT/OT, chair position in bed - Drain management per primary - Continue ICU level care given degree of hypoxemia, BIPAP dependence, and WOB; high risk to progress to reintubation  DMT2 P: - SSI  HTN HLD P: -cont statin  34 min cc time Discussed during TCTS/PCCM multidisciplinary rounds  10/12/22 MD PCCM

## 2022-10-10 NOTE — Progress Notes (Signed)
Occupational Therapy Treatment Patient Details Name: George Wallace MRN: [id number] DOB: 05-08-1961 Today's Date: 10/10/2022   History of present illness Pt is a 61 y.o. 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 Pt requiring intermittent NIV post-op. 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   OT comments  Patient able to progress out of bed to the recliner.  Focused on breaking transfer into small/incremental steps with breaks to focus on breathing and maintaining activity tolerance.  Patient able to move with largely Min A, constant stimuli to maintain LOA, and cues for sternal precautions.  Acute OT continues to be indicated to maximize his functional status and assist with progression to AIR for continued rehab.      Recommendations for follow up therapy are one component of a multi-disciplinary discharge planning process, led by the attending physician.  Recommendations may be updated based on patient status, additional functional criteria and insurance authorization.    Follow Up Recommendations  Acute inpatient rehab (3hours/day)     Assistance Recommended at Discharge Frequent or constant Supervision/Assistance  Patient can return home with the following  A little help with walking and/or transfers;Assistance with cooking/housework;Assist for transportation;Help with stairs or ramp for entrance;A lot of help with bathing/dressing/bathroom   Equipment Recommendations  None recommended by OT    Recommendations for Other Services      Precautions / Restrictions Precautions Precautions: Fall;Sternal;Other (comment) Precaution Booklet Issued: No Precaution Comments: chest tube; HHF O2 Restrictions Weight Bearing Restrictions: Yes RUE Weight Bearing: Non weight bearing LUE  Weight Bearing: Non weight bearing Other Position/Activity Restrictions: UE sternal percautions       Mobility Bed Mobility Overal bed mobility: Needs Assistance Bed Mobility: Supine to Sit     Supine to sit: HOB elevated, Min assist       Patient Response: Cooperative  Transfers Overall transfer level: Needs assistance Equipment used: 1 person hand held assist Transfers: Sit to/from Stand, Bed to chair/wheelchair/BSC Sit to Stand: Min guard, Min assist     Step pivot transfers: Min guard, Min assist           Balance Overall balance assessment: Needs assistance Sitting-balance support: Feet supported Sitting balance-Leahy Scale: Fair     Standing balance support: Bilateral upper extremity supported Standing balance-Leahy Scale: Fair Standing balance comment: constant VC's to continue the movement due to lethargy                           ADL either performed or assessed with clinical judgement   ADL                                                                              Cognition Arousal/Alertness: Lethargic Behavior During Therapy: Flat affect Overall Cognitive Status: No family/caregiver present to determine baseline cognitive functioning                                 General Comments:  Patient appeared to fall asleep with eyes open a few times, but able to respond with enternal stimuli.        Exercises      Shoulder Instructions       General Comments  VSS on HFNC    Pertinent Vitals/ Pain       Pain Assessment Pain Assessment: Faces Faces Pain Scale: No hurt Pain Intervention(s): Monitored during session                                                          Frequency  Min 2X/week        Progress Toward Goals  OT Goals(current goals can now be found in the care plan section)  Progress towards OT goals: Progressing toward goals  Acute  Rehab OT Goals OT Goal Formulation: With patient Time For Goal Achievement: 10/22/22 Potential to Achieve Goals: Good  Plan Discharge plan remains appropriate    Co-evaluation                 AM-PAC OT "6 Clicks" Daily Activity     Outcome Measure   Help from another person eating meals?: None Help from another person taking care of personal grooming?: A Little Help from another person toileting, which includes using toliet, bedpan, or urinal?: A Lot Help from another person bathing (including washing, rinsing, drying)?: A Lot Help from another person to put on and taking off regular upper body clothing?: A Little Help from another person to put on and taking off regular lower body clothing?: A Lot 6 Click Score: 16    End of Session Equipment Utilized During Treatment: Oxygen  OT Visit Diagnosis: Unsteadiness on feet (R26.81);Other abnormalities of gait and mobility (R26.89);Repeated falls (R29.6);Muscle weakness (generalized) (M62.81)   Activity Tolerance Patient tolerated treatment well   Patient Left in chair;with call bell/phone within reach;with chair alarm set   Nurse Communication Mobility status;Other (comment)        Time: [id number] OT Time Calculation (min): 29 min  Charges: OT General Charges $OT Visit: 1 Visit OT Treatments $Self Care/Home Management : 8-22 mins $Therapeutic Activity: 8-22 mins  10/10/2022  RP, OTR/L  Acute Rehabilitation Services  Office:  [phone]   George Wallace 10/10/2022, 10:57 AM

## 2022-10-10 NOTE — TOC CM/SW Note (Signed)
.   Transition of Care [facility]) Screening Note   Patient Details  Name: Simon Llamas Date of Birth: [birth year]   Transition of Care [facility]) CM/SW Contact:    Elliot Cousin, RN Phone Number: [phone] 10/10/2022, 5:42 PM    Transition of Care Department [facility]) has reviewed patient. We will continue to monitor patient advancement through interdisciplinary progression rounds. PT/OT recommending IP rehab. CIR following and will evaluate closer to medically readiness.

## 2022-10-11 ENCOUNTER — Inpatient Hospital Stay (HOSPITAL_COMMUNITY): Payer: BC Managed Care – PPO

## 2022-10-11 DIAGNOSIS — J9859 Other diseases of mediastinum, not elsewhere classified: Secondary | ICD-10-CM | POA: Diagnosis not present

## 2022-10-11 LAB — TYPE AND SCREEN
ABO/RH(D): O POS
Antibody Screen: NEGATIVE
Unit division: 0
Unit division: 0
Unit division: 0
Unit division: 0
Unit division: 0
Unit division: 0
Unit division: 0
Unit division: 0
Unit division: 0
Unit division: 0

## 2022-10-11 LAB — BPAM RBC
Blood Product Expiration Date: 202401022359
Blood Product Expiration Date: 202401052359
Blood Product Expiration Date: 202401092359
Blood Product Expiration Date: 202401092359
Blood Product Expiration Date: 202401092359
Blood Product Expiration Date: 202401092359
Blood Product Expiration Date: 202401092359
Blood Product Expiration Date: 202401092359
Blood Product Expiration Date: 202401092359
Blood Product Expiration Date: 202401092359
ISSUE DATE / TIME: 202312111136
ISSUE DATE / TIME: 202312111136
ISSUE DATE / TIME: 202312111555
ISSUE DATE / TIME: 202312120002
ISSUE DATE / TIME: 202312120251
ISSUE DATE / TIME: 202312121623
Unit Type and Rh: 5100
Unit Type and Rh: 5100
Unit Type and Rh: 5100
Unit Type and Rh: 5100
Unit Type and Rh: 5100
Unit Type and Rh: 5100
Unit Type and Rh: 5100
Unit Type and Rh: 5100
Unit Type and Rh: 5100
Unit Type and Rh: 5100

## 2022-10-11 LAB — BASIC METABOLIC PANEL
Anion gap: 15 (ref 5–15)
BUN: 12 mg/dL (ref 6–20)
CO2: 29 mmol/L (ref 22–32)
Calcium: 8.5 mg/dL — ABNORMAL LOW (ref 8.9–10.3)
Chloride: 93 mmol/L — ABNORMAL LOW (ref 98–111)
Creatinine, Ser: 0.94 mg/dL (ref 0.61–1.24)
GFR, Estimated: >60 mL/min (ref >60)
Glucose, Bld: 147 mg/dL — ABNORMAL HIGH (ref 70–99)
Potassium: 3.8 mmol/L (ref 3.5–5.1)
Sodium: 137 mmol/L (ref 135–145)

## 2022-10-11 LAB — GLUCOSE, CAPILLARY
Glucose-Capillary: 141 mg/dL — ABNORMAL HIGH (ref 70–99)
Glucose-Capillary: 145 mg/dL — ABNORMAL HIGH (ref 70–99)
Glucose-Capillary: 149 mg/dL — ABNORMAL HIGH (ref 70–99)
Glucose-Capillary: 161 mg/dL — ABNORMAL HIGH (ref 70–99)
Glucose-Capillary: 161 mg/dL — ABNORMAL HIGH (ref 70–99)
Glucose-Capillary: 164 mg/dL — ABNORMAL HIGH (ref 70–99)
Glucose-Capillary: 199 mg/dL — ABNORMAL HIGH (ref 70–99)

## 2022-10-11 MED ORDER — FUROSEMIDE 10 MG/ML IJ SOLN
40.0000 mg | Freq: Four times a day (QID) | INTRAMUSCULAR | Status: AC
  Administered 2022-10-11 (×2): 40 mg via INTRAVENOUS
  Filled 2022-10-11 (×2): qty 4

## 2022-10-11 MED ORDER — SORBITOL 70 % SOLN
30.0000 mL | Freq: Once | Status: AC
  Administered 2022-10-11: 30 mL via ORAL
  Filled 2022-10-11: qty 30

## 2022-10-11 MED ORDER — POTASSIUM CHLORIDE 10 MEQ/100ML IV SOLN
10.0000 meq | INTRAVENOUS | Status: AC
  Administered 2022-10-11 (×3): 10 meq via INTRAVENOUS
  Filled 2022-10-11 (×3): qty 100

## 2022-10-11 MED ORDER — AMIODARONE HCL 200 MG PO TABS
200.0000 mg | ORAL_TABLET | Freq: Two times a day (BID) | ORAL | Status: DC
  Administered 2022-10-11 – 2022-10-22 (×23): 200 mg via ORAL
  Filled 2022-10-11 (×23): qty 1

## 2022-10-11 MED ORDER — ENSURE ENLIVE PO LIQD
237.0000 mL | Freq: Three times a day (TID) | ORAL | Status: DC
  Administered 2022-10-11 – 2022-10-17 (×15): 237 mL via ORAL
  Filled 2022-10-11 (×3): qty 237

## 2022-10-11 MED ORDER — GLUCERNA SHAKE PO LIQD: 237.0000 mL | Freq: Three times a day (TID) | ORAL | Status: DC

## 2022-10-11 NOTE — Progress Notes (Signed)
   NAME:  George Wallace, MRN:  [id number], DOB:  03/13/1961, LOS: 4 ADMISSION DATE:  10/07/2022 CONSULTATION DATE:  12/11 REFERRING MD:  Dr. Cliffton Asters CHIEF COMPLAINT:  resection of mediastinal mass and sternotomy  History of Present Illness:  Patient is a 61 year old male with pertinent PMH of DMT2, HTN, HLD presents to [facility] on 12/11 for resection of mediastinal mass and sternotomy.  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  Biopsies were performed at [facility] that were inconclusive.  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  On 12/11 patient arrived to [facility] for elective surgery.  Resection of mediastinal mass was performed. 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  It was taken en bloc.  Required sternotomy to anatomy at the vein with cessation of bleeding.  Postop transferred to ICU intubated.  PCCM consulted.  Pertinent Medical History:   Past Medical History:  Diagnosis Date   Diabetes (HCC)    Diet controlled   Hyperlipidemia      Significant Hospital Events: Including procedures, antibiotic start and stop dates in addition to other pertinent events   12/11 - s/p sternotomy and resection of mediastinal mass; post op intubated 12/12 extubated 12/13 BIPAP intermittent 12/14 Afib on amio  Interim History / Subjective:  Still high O2 needs. Diuresed some. He is depressed.  Objective:  Blood pressure 113/71, pulse (!) 111, temperature 99 F (37.2 C), temperature source Axillary, resp. rate 18, height 5\' 8"  (1.727 m), weight 127.1 kg, SpO2 100 %.    Vent Mode: BIPAP FiO2 (%):  [40 %-50 %] 50 % Set Rate:  [8 bmp] 8 bmp PEEP:  [5 cmH20] 5 cmH20   Intake/Output Summary (Last 24 hours) at 10/11/2022 10/13/2022 Last data filed at 10/11/2022 0930 Gross per 24 hour  Intake 1811.14 ml  Output 2775 ml  Net -963.86 ml    Filed  Weights   10/09/22 0500 10/10/22 0500 10/11/22 0400  Weight: 129.5 kg 130.8 kg 127.1 kg    Physical Examination: No distress on BIPAP Moves ext to command Reduced breath sounds on left Ext warm, sternotomy CDI Minimal edema  BMP stable No CBC CXR still a little wet, L atelectasis and hemidiaphragm elevation  Resolved Hospital Problem List:    Assessment & Plan:   S/p resection of mediastinal mass and sternotomy Postoperative vent management- resolved Postoperative hypoxemia- related to splinting and L phrenic nerve palsy, edema DM2, HTN  - Push diuresis again today  - Mobilization, PT/OT, chair position in bed: appreciate therapist help  - BP management, incision site care, GDMT per primary  - Continue ICU level care given degree of hypoxemia, BIPAP dependence, and WOB; high risk to progress to reintubation but hopefully with diuresis can avoid   32 min cc time Discussed during TCTS/PCCM multidisciplinary rounds  10/13/22 MD PCCM

## 2022-10-11 NOTE — Progress Notes (Addendum)
Initial Nutrition Assessment  DOCUMENTATION CODES:   Obesity unspecified  INTERVENTION:   Discussed diet advancement with Dr. Katrinka Blazing. Ok per MD to increase to [facility]. If pt tolerates this well, recommend increasing to Dysphagia 3/Mechanical Soft  Change to Ensure Plus/Ensure Plus High Protein po BID, each supplement provides 350 kcal and 16-20 grams of protein. Glucerna provides less calories and less than 50% of the protein per 8 ounce supplement. Pt is on a nutritionally inadequate diet currently that is low in protein  Magic cup TID with meals, each supplement provides 290 kcal and 9 grams of protein  If unable to advance diet and/or po intake not acceptable by early next week, recommend considering Cortrak placement  NUTRITION DIAGNOSIS:   Inadequate oral intake related to acute illness as evidenced by per patient/family report (respiratory status negatively impacting po intake and diet advancement).   GOAL:   Patient will meet greater than or equal to 90% of their needs   MONITOR:   Diet advancement, Labs, Weight trends, TF tolerance, Skin  REASON FOR ASSESSMENT:   Rounds    ASSESSMENT:   61 yo admitted 12/11 for sternotomy with resection of mediastinal mass. PMH includes DM, HLD  12/11 Admitted, OR for sternotomy and resection of mediastinal mass 12/12 Diet advanced to CL  Pt is on and off Bipap. Currently on HFNC but st. Pt at high risk for intubation per MD notes. Pt with post op hypoxemia related to splinting and L phrenic nerve palsy and edema.   Pt up to chair on visit today; arousable but keeps falling asleep.  Pt has been NPO/CL since admission, 4 days. Tolerating some po but not taking in a lot. Clear Liquid diet is nutritionally inadequate even if pt was eating 100%.   CBGs between 125-180, only on sliding scale insulin. Noted MD ordered Glucerna Shakes today. Given pt on CL diet which essentially contains no protein, low in calories, recommend changing to  EnsurePlus as this contains twice as much protein as Glucerna and more calories per 8 ounce container  +Constipation (no BM since admission). Noted pt received 1 time dose of sorbitol this AM. Also receiving reglan, miralax, senna-docusate, dulcolax  Pt reports UBW around 160 pounds; however, current wt 280 pounds and lowest weight per weight encounters is closer to 260 (I wonder if this is what pt meant). Pt denies weight loss  Pt reports he was eating well prior to admission but  unable to get much nutrition history from pt; pt kept falling asleep  Labs: CBGs 125-180, Creatinine wdl Meds: ss novolog, lasix, reglan, miralax, senna-docusate, dulcolax  NUTRITION - FOCUSED PHYSICAL EXAM:  Flowsheet Row Most Recent Value  Orbital Region No depletion  Upper Arm Region Unable to assess  Thoracic and Lumbar Region No depletion  Buccal Region No depletion  Temple Region No depletion  Clavicle Bone Region No depletion  Clavicle and Acromion Bone Region No depletion  Scapular Bone Region No depletion  Dorsal Hand No depletion  Patellar Region Unable to assess  Anterior Thigh Region Unable to assess  Posterior Calf Region Unable to assess  Edema (RD Assessment) Moderate       Diet Order:   Diet Order             Diet full liquid Room service appropriate? Yes; Fluid consistency: Thin  Diet effective now                   EDUCATION NEEDS:   Education needs have  been addressed  Skin:  Skin Assessment: Reviewed RN Assessment  Last BM:  12/10  Height:   Ht Readings from Last 1 Encounters:  10/07/22 5\' 8"  (1.727 m)    Weight:   Wt Readings from Last 1 Encounters:  10/11/22 127.1 kg    BMI:  Body mass index is 42.6 kg/m.  Estimated Nutritional Needs:   Kcal:  2100-2300 kcals  Protein:  115-130 g  Fluid:  2L    10/13/22 MS, RDN, LDN, CNSC Registered Dietitian 3 Clinical Nutrition RD Pager and On-Call Pager Number Located in [place]

## 2022-10-11 NOTE — Progress Notes (Signed)
Physical Therapy Treatment Patient Details Name: George Wallace MRN: [id number] DOB: 26-Feb-1961 Today's Date: 10/11/2022   History of Present Illness Pt is a 61 y.o. 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 Pt requiring intermittent NIV post-op. 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    PT Comments    Focused session on therapeutic exercises to improve pt's strength and endurance. Educated pt on diaphragmatic/belly breathing having pt place hand on chest and a hand on his abdomen for input. Pt would benefit from further practice with this. Pt lethargic, needing stimulation and cues to open eyes to attend to task. Despite his lethargy, pt was motivated to participate and improve. Will continue to follow acutely. Current recommendations remain appropriate.    Recommendations for follow up therapy are one component of a multi-disciplinary discharge planning process, led by the attending physician.  Recommendations may be updated based on patient status, additional functional criteria and insurance authorization.  Follow Up Recommendations  Acute inpatient rehab (3hours/day)     Assistance Recommended at Discharge Frequent or constant Supervision/Assistance  Patient can return home with the following A little help with walking and/or transfers;A little help with bathing/dressing/bathroom;Assistance with cooking/housework;Assist for transportation;Help with stairs or ramp for entrance   Equipment Recommendations  Other (comment) (TBD)    Recommendations for Other Services       Precautions / Restrictions Precautions Precautions: Fall;Sternal;Other (comment) Precaution Booklet Issued: No Precaution Comments: chest tube; HHF O2 vs BiPAP Restrictions Weight Bearing Restrictions:  Yes RUE Weight Bearing: Touch down weight bearing LUE Weight Bearing: Touch down weight bearing Other Position/Activity Restrictions: UE sternal percautions     Mobility  Bed Mobility               General bed mobility comments: Pt sitting in chair upon arrival    Transfers Overall transfer level: Needs assistance Equipment used: None Transfers: Sit to/from Stand Sit to Stand: Min assist           General transfer comment: MinA to power up to stand and steady pt coming to stand from recliner 2x while hugging heart pillow. Cues provided to scoot to edge prior to rocking trunk to stand    Ambulation/Gait Ambulation/Gait assistance: Min assist Gait Distance (Feet): 2 Feet Assistive device: None Gait Pattern/deviations: Step-to pattern, Decreased step length - right, Decreased step length - left, Decreased stride length, Drifts right/left Gait velocity: reduced Gait velocity interpretation: <1.31 ft/sec, indicative of household ambulator   General Gait Details: Pt with slow unsteady steps anterior and posterior at recliner hugging heart pillow. Pt swaying trunk in all directions and needing cues to keep eyes open. MinA for stability.   Stairs             Wheelchair Mobility    Modified Rankin (Stroke Patients Only)       Balance Overall balance assessment: Needs assistance Sitting-balance support: Feet supported Sitting balance-Leahy Scale: Fair     Standing balance support: No upper extremity supported Standing balance-Leahy Scale: Fair Standing balance comment: Up to minA to stand without UE support                            Cognition Arousal/Alertness: Lethargic Behavior During Therapy: WFL for tasks assessed/performed Overall Cognitive Status: Difficult to assess  General Comments: Pt losing attention and closing eyes often, appearing to fall asleep at times, needing stimulation to  attend to task        Exercises General Exercises - Lower Extremity Hip Flexion/Marching: AROM, Strengthening, Both, 5 reps, Standing Mini-Sqauts: AROM, Strengthening, Both, Standing, Other reps (comment) (x >7 reps) Other Exercises Other Exercises: educated pt on diaphragmatic/belly breathing sitting up in chair with pt performing correctly intermittently with cues to feel belly move and try to keep chest still with breathing    General Comments General comments (skin integrity, edema, etc.): VSS on BiPAP      Pertinent Vitals/Pain Pain Assessment Pain Assessment: Faces Faces Pain Scale: Hurts little more Pain Location: generalized with movement Pain Descriptors / Indicators: Grimacing Pain Intervention(s): Limited activity within patient's tolerance, Monitored during session, Repositioned    Home Living                          Prior Function            PT Goals (current goals can now be found in the care plan section) Acute Rehab PT Goals Patient Stated Goal: to improve breathing PT Goal Formulation: With patient Time For Goal Achievement: 10/22/22 Potential to Achieve Goals: Good Progress towards PT goals: Progressing toward goals (slowly)    Frequency    Min 3X/week      PT Plan Current plan remains appropriate    Co-evaluation              AM-PAC PT "6 Clicks" Mobility   Outcome Measure  Help needed turning from your back to your side while in a flat bed without using bedrails?: A Lot Help needed moving from lying on your back to sitting on the side of a flat bed without using bedrails?: A Lot Help needed moving to and from a bed to a chair (including a wheelchair)?: A Lot Help needed standing up from a chair using your arms (e.g., wheelchair or bedside chair)?: A Little Help needed to walk in hospital room?: Total Help needed climbing 3-5 steps with a railing? : Total 6 Click Score: 11    End of Session Equipment Utilized During  Treatment: Oxygen Activity Tolerance: Patient limited by lethargy Patient left: in chair;with call bell/phone within reach;with chair alarm set Nurse Communication: Mobility status PT Visit Diagnosis: Other abnormalities of gait and mobility (R26.89);Muscle weakness (generalized) (M62.81);Unsteadiness on feet (R26.81)     Time: 4818-5631 PT Time Calculation (min) (ACUTE ONLY): 24 min  Charges:  $Therapeutic Exercise: 23-37 mins                     George Wallace, PT, DPT Acute Rehabilitation Services  Office: [phone]    George Wallace 10/11/2022, 3:58 PM

## 2022-10-11 NOTE — Progress Notes (Signed)
      [address]       [place] [address]             [phone]      POD # 4  Sitting up in chair at present  BP (!) 129/92   Pulse (!) 108   Temp 98.1 F (36.7 C) (Axillary)   Resp 19   Ht 5\' 8"  (1.727 m)   Wt 127.1 kg   SpO2 100%   BMI 42.60 kg/m  In sinus tach 5L [place] 96% sat   Intake/Output Summary (Last 24 hours) at 10/11/2022 1727 Last data filed at 10/11/2022 1430 Gross per 24 hour  Intake 1127.61 ml  Output 2286 ml  Net -1158.39 ml   Respiratory status still marginal  10/13/2022 C. Viviann Spare, MD [facility] [phone]

## 2022-10-11 NOTE — Progress Notes (Addendum)
[facility] DAILY ICU PROGRESS NOTE                   [address]            [place] [address]          [phone]   4 Days Post-Op Procedure(s) (LRB): XI ROBOTIC ASSISTED BILATERAL RESECTION OF MEDIASTINAL MASS  AND STERNOTOMY (Bilateral) INTERCOSTAL NERVE BLOCK  Total Length of Stay:  LOS: 4 days   Subjective: Patient sleeping and awakened. He was able to sit in chair yesteray (on HFNC)  Objective: Vital signs in last 24 hours: Temp:  [97.7 F (36.5 C)-99.2 F (37.3 C)] 98.4 F (36.9 C) (12/15 0300) Pulse Rate:  [83-111] 98 (12/15 0700) Cardiac Rhythm: Normal sinus rhythm;Sinus tachycardia (12/15 0400) Resp:  [11-31] 22 (12/15 0700) BP: (99-155)/(47-114) 138/85 (12/15 0700) SpO2:  [91 %-100 %] 100 % (12/15 0700) FiO2 (%):  [40 %-50 %] 40 % (12/15 0400) Weight:  [127.1 kg] 127.1 kg (12/15 0400)  Filed Weights   10/09/22 0500 10/10/22 0500 10/11/22 0400  Weight: 129.5 kg 130.8 kg 127.1 kg    Weight change: -3.7 kg      Intake/Output from previous day: 12/14 0701 - 12/15 0700 In: 1462.4 [P.O.:475; I.V.:827.4; IV Piggyback:160] Out: 2505 [Urine:2325; Chest Tube:180]  Intake/Output this shift: No intake/output data recorded.  Current Meds: Scheduled Meds:  acetaminophen  1,000 mg Oral Q6H   aspirin EC  325 mg Oral Daily   bisacodyl  10 mg Oral Daily   Chlorhexidine Gluconate Cloth  6 each Topical Daily   enoxaparin (LOVENOX) injection  40 mg Subcutaneous Daily   insulin aspart  0-24 Units Subcutaneous Q4H   lisinopril  2.5 mg Oral Daily   metoCLOPramide (REGLAN) injection  10 mg Intravenous Q6H   mouth rinse  15 mL Mouth Rinse 4 times per day   polyethylene glycol  17 g Oral Daily   rosuvastatin  10 mg Oral QPM   senna-docusate  1 tablet Oral Daily   sodium chloride flush  3 mL Intravenous Q12H   tamsulosin  0.4 mg Oral Daily   Continuous Infusions:  sodium chloride Stopped (10/11/22 0653)   amiodarone 30 mg/hr (10/11/22 0700)    dexmedetomidine (PRECEDEX) IV infusion Stopped (10/09/22 2340)   methocarbamol (ROBAXIN) IV Stopped (10/11/22 0034)   PRN Meds:.sodium chloride, albuterol, dextrose, metoprolol tartrate, morphine injection, ondansetron (ZOFRAN) IV, mouth rinse, oxyCODONE, sodium chloride flush, traMADol  General appearance: alert, cooperative, and no distress Neurologic: intact Heart: RRR Lungs: Slightly diminished bibasilar breath sounds Abdomen: Soft, obese, non tender, occasional bowel sounds Extremities: SCDs in place Wound: Aquacel removed and sternal wound is clean and dry.. Other wounds are clean and dry Chest tubes: to suction  Lab Results: CBC: Recent Labs    10/09/22 0152 10/10/22 0434  WBC 16.9* 13.5*  HGB 11.4* 10.1*  HCT 35.2* 30.5*  PLT 243 223    BMET:  Recent Labs    10/10/22 0434 10/11/22 0552  NA 136 137  K 5.0 3.8  CL 98 93*  CO2 31 29  GLUCOSE 151* 147*  BUN 15 12  CREATININE 1.01 0.94  CALCIUM 8.3* 8.5*     CMET: Lab Results  Component Value Date   WBC 13.5 (H) 10/10/2022   HGB 10.1 (L) 10/10/2022   HCT 30.5 (L) 10/10/2022   PLT 223 10/10/2022   GLUCOSE 147 (H) 10/11/2022   ALT 23 10/03/2022   AST 22 10/03/2022   NA  137 10/11/2022   K 3.8 10/11/2022   CL 93 (L) 10/11/2022   CREATININE 0.94 10/11/2022   BUN 12 10/11/2022   CO2 29 10/11/2022   INR 1.2 10/07/2022   HGBA1C 6.9 (H) 10/03/2022      PT/INR:  No results for input(s): "LABPROT", "INR" in the last 72 hours.  Radiology: No results found.   Assessment/Plan: S/P Procedure(s) (LRB): XI ROBOTIC ASSISTED BILATERAL RESECTION OF MEDIASTINAL MASS  AND STERNOTOMY (Bilateral) INTERCOSTAL NERVE BLOCK  CV-A fib with RVR late 12/13. Maintaining SR. On Amiodarone drip and low dose Lisinopril as taken pre op.  Pulmonary-He did not tolerate HFNC yesterday;mostly on bi pap. Pulmonary/CCM following. Chest tube output 180 cc last 24 hours.Chest tubes to suction, no air leak. CXR this am shows patient  rotated to the left, low lung volumes and  perihilar/suprahilar airspace disease. Encourage incentive spirometer and flutter valve. Expected post op blood loss anemia-H and H this am decreased to 10.1 and 30.5 4. DM-CBGs 151/161/145. Will restart Metformin once tolerating a diet well. Insulin PRN for now. 5. Leukocytosis-WBC this am is decreased to 13,500. Likely related to SIRS. Remains afebrile 6. On Lovenox for DVT prophylaxis 7. GI-on clear liquid diet. As discussed with Dr. Cliffton Asters, will give Glucerna 8. Supplement potassium  George Margaretann Loveless PA-C 10/11/2022 7:32 AM  Agree with above Tolerated more time off bipap yesterday Path negative for malignancy or lymphoma Continue pulm hygiene  George Wallace

## 2022-10-12 ENCOUNTER — Inpatient Hospital Stay (HOSPITAL_COMMUNITY): Payer: BC Managed Care – PPO

## 2022-10-12 DIAGNOSIS — J9859 Other diseases of mediastinum, not elsewhere classified: Secondary | ICD-10-CM | POA: Diagnosis not present

## 2022-10-12 LAB — GLUCOSE, CAPILLARY
Glucose-Capillary: 143 mg/dL — ABNORMAL HIGH (ref 70–99)
Glucose-Capillary: 128 mg/dL — ABNORMAL HIGH (ref 70–99)
Glucose-Capillary: 144 mg/dL — ABNORMAL HIGH (ref 70–99)
Glucose-Capillary: 141 mg/dL — ABNORMAL HIGH (ref 70–99)
Glucose-Capillary: 159 mg/dL — ABNORMAL HIGH (ref 70–99)
Glucose-Capillary: 137 mg/dL — ABNORMAL HIGH (ref 70–99)

## 2022-10-12 LAB — CBC
HCT: 35.5 % — ABNORMAL LOW (ref 39.0–52.0)
Hemoglobin: 11.8 g/dL — ABNORMAL LOW (ref 13.0–17.0)
MCH: 30.5 pg (ref 26.0–34.0)
MCHC: 33.2 g/dL (ref 30.0–36.0)
MCV: 91.7 fL (ref 80.0–100.0)
Platelets: 313 10*3/uL (ref 150–400)
RBC: 3.87 MIL/uL — ABNORMAL LOW (ref 4.22–5.81)
RDW: 14.7 % (ref 11.5–15.5)
WBC: 13.5 10*3/uL — ABNORMAL HIGH (ref 4.0–10.5)
nRBC: 0.6 % — ABNORMAL HIGH (ref 0.0–0.2)

## 2022-10-12 MED ORDER — POTASSIUM CHLORIDE 10 MEQ/100ML IV SOLN
10.0000 meq | INTRAVENOUS | Status: AC
  Administered 2022-10-12 (×3): 10 meq via INTRAVENOUS
  Filled 2022-10-12 (×2): qty 100

## 2022-10-12 MED ORDER — METOPROLOL SUCCINATE ER 25 MG PO TB24
25.0000 mg | ORAL_TABLET | Freq: Every day | ORAL | Status: DC
  Administered 2022-10-12 – 2022-10-15 (×4): 25 mg via ORAL
  Filled 2022-10-12 (×4): qty 1

## 2022-10-12 MED ORDER — INSULIN ASPART 100 UNIT/ML IJ SOLN: 0.0000 [IU] | Freq: Every day | INTRAMUSCULAR | Status: DC

## 2022-10-12 MED ORDER — INSULIN ASPART 100 UNIT/ML IJ SOLN
0.0000 [IU] | Freq: Three times a day (TID) | INTRAMUSCULAR | Status: DC
  Administered 2022-10-12 – 2022-10-13 (×3): 2 [IU] via SUBCUTANEOUS
  Administered 2022-10-13: 3 [IU] via SUBCUTANEOUS
  Administered 2022-10-14 – 2022-10-15 (×4): 2 [IU] via SUBCUTANEOUS
  Administered 2022-10-15: 3 [IU] via SUBCUTANEOUS
  Administered 2022-10-16 (×2): 2 [IU] via SUBCUTANEOUS
  Administered 2022-10-16: 3 [IU] via SUBCUTANEOUS
  Administered 2022-10-17 – 2022-10-18 (×3): 2 [IU] via SUBCUTANEOUS
  Administered 2022-10-19: 3 [IU] via SUBCUTANEOUS
  Administered 2022-10-20 (×2): 2 [IU] via SUBCUTANEOUS

## 2022-10-12 MED ORDER — FUROSEMIDE 10 MG/ML IJ SOLN
40.0000 mg | Freq: Four times a day (QID) | INTRAMUSCULAR | Status: AC
  Administered 2022-10-12 (×2): 40 mg via INTRAVENOUS
  Filled 2022-10-12 (×2): qty 4

## 2022-10-12 MED ORDER — POTASSIUM CHLORIDE 10 MEQ/100ML IV SOLN
10.0000 meq | INTRAVENOUS | Status: AC
  Administered 2022-10-12: 10 meq via INTRAVENOUS
  Filled 2022-10-12 (×2): qty 100

## 2022-10-12 NOTE — Progress Notes (Signed)
   NAME:  George Wallace, MRN:  [id number], DOB:  [birth year], LOS: 5 ADMISSION DATE:  10/07/2022 CONSULTATION DATE:  12/11 REFERRING MD:  Dr. Cliffton Asters CHIEF COMPLAINT:  resection of mediastinal mass and sternotomy  History of Present Illness:  Patient is a 61 year old male with pertinent PMH of DMT2, HTN, HLD presents to [facility] on 12/11 for resection of mediastinal mass and sternotomy.  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  Biopsies were performed at [facility] that were inconclusive.  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  On 12/11 patient arrived to [facility] for elective surgery.  Resection of mediastinal mass was performed. 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  It was taken en bloc.  Required sternotomy to anatomy at the vein with cessation of bleeding.  Postop transferred to ICU intubated.  PCCM consulted.  Pertinent Medical History:   Past Medical History:  Diagnosis Date   Diabetes (HCC)    Diet controlled   Hyperlipidemia      Significant Hospital Events: Including procedures, antibiotic start and stop dates in addition to other pertinent events   12/11 - s/p sternotomy and resection of mediastinal mass; post op intubated 12/12 extubated 12/13 BIPAP intermittent 12/14 Afib on amio  Interim History / Subjective:  No events. Not using IS. Appears fatigued.  Objective:  Blood pressure (!) 135/92, pulse 98, temperature 98.3 F (36.8 C), temperature source Oral, resp. rate 14, height 5\' 8"  (1.727 m), weight 127 kg, SpO2 100 %.    Vent Mode: PSV;BIPAP;CPAP FiO2 (%):  [40 %-50 %] 40 % PEEP:  [5 cmH20] 5 cmH20 Pressure Support:  [10 cmH20] 10 cmH20 Plateau Pressure:  [13 cmH20] 13 cmH20   Intake/Output Summary (Last 24 hours) at 10/12/2022 0839 Last data filed at 10/12/2022 0500 Gross per 24 hour  Intake 876.33 ml   Output 1571 ml  Net -694.67 ml    Filed Weights   10/10/22 0500 10/11/22 0400 10/12/22 0500  Weight: 130.8 kg 127.1 kg 127 kg    Physical Examination: No distress sitting in chair on [place] Moves ext to command Reduced breath sounds on left Ext warm, sternotomy CDI Minimal edema stable  BMP ok CBC stable CXR looks a bit better to me  Resolved Hospital Problem List:    Assessment & Plan:   S/p resection of mediastinal mass and sternotomy Postoperative vent management- resolved Postoperative hypoxemia- related to splinting and L phrenic nerve palsy, edema DM2, HTN  - Lasix reordered, K replacement  - Mobilization, PT/OT, chair position in bed: appreciate therapist help; for CIR once stronger; really needs to try to use his IS more  - BP management, incision site care, GDMT per primary  - Continue ICU level care until resp status improves  - Will follow while in ICU  10/14/22 MD PCCM

## 2022-10-12 NOTE — Progress Notes (Signed)
      [address]       [place] [address]             [phone]      Back on BIPAP  BP 104/65   Pulse 88   Temp 98.4 F (36.9 C) (Oral)   Resp (!) 9   Ht 5\' 8"  (1.727 m)   Wt 127 kg   SpO2 99%   BMI 42.57 kg/m    Intake/Output Summary (Last 24 hours) at 10/12/2022 1738 Last data filed at 10/12/2022 1500 Gross per 24 hour  Intake 795.39 ml  Output 1900 ml  Net -1104.61 ml    Still requiring BIPAP frequently for increased WOB  10/14/2022 C. Viviann Spare, MD [facility] [phone]

## 2022-10-12 NOTE — Progress Notes (Signed)
5 Days Post-Op Procedure(s) (LRB): XI ROBOTIC ASSISTED BILATERAL RESECTION OF MEDIASTINAL MASS  AND STERNOTOMY (Bilateral) INTERCOSTAL NERVE BLOCK Subjective: Some incisional pain Up in chair- effort to talk No problems swallowing  Objective: Vital signs in last 24 hours: Temp:  [97.9 F (36.6 C)-98.6 F (37 C)] 98.3 F (36.8 C) (12/16 0759) Pulse Rate:  [87-114] 98 (12/16 0700) Cardiac Rhythm: Normal sinus rhythm (12/16 0400) Resp:  [8-33] 14 (12/16 0813) BP: (90-147)/(57-93) 135/92 (12/16 0700) SpO2:  [97 %-100 %] 100 % (12/16 0700) FiO2 (%):  [40 %-50 %] 40 % (12/16 0400) Weight:  [793 kg] 127 kg (12/16 0500)  Hemodynamic parameters for last 24 hours:    Intake/Output from previous day: 12/15 0701 - 12/16 0700 In: 876.3 [P.O.:532; I.V.:165.8; IV Piggyback:178.6] Out: 1571 [Urine:1500; Stool:1; Chest Tube:70] Intake/Output this shift: No intake/output data recorded.  General appearance: alert and cooperative Neurologic: intact Heart: tachy, regular Lungs: diminished breath sounds bibasilar Abdomen: normal findings: soft, non-tender Wound: clean and dry  Lab Results: Recent Labs    10/10/22 0434 10/12/22 0122  WBC 13.5* 13.5*  HGB 10.1* 11.8*  HCT 30.5* 35.5*  PLT 223 313   BMET:  Recent Labs    10/10/22 0434 10/11/22 0552  NA 136 137  K 5.0 3.8  CL 98 93*  CO2 31 29  GLUCOSE 151* 147*  BUN 15 12  CREATININE 1.01 0.94  CALCIUM 8.3* 8.5*    PT/INR: No results for input(s): "LABPROT", "INR" in the last 72 hours. ABG    Component Value Date/Time   PHART 7.277 (L) 10/07/2022 1549   HCO3 20.9 10/07/2022 1549   TCO2 22 10/07/2022 1549   TCO2 23 10/07/2022 1549   ACIDBASEDEF 6.0 (H) 10/07/2022 1549   O2SAT 99 10/07/2022 1549   CBG (last 3)  Recent Labs    10/11/22 2321 10/12/22 0402 10/12/22 0757  GLUCAP 141* 143* 128*    Assessment/Plan: S/P Procedure(s) (LRB): XI ROBOTIC ASSISTED BILATERAL RESECTION OF MEDIASTINAL MASS  AND STERNOTOMY  (Bilateral) INTERCOSTAL NERVE BLOCK - POD # 5 NEURO- intact CV- in SR on amiodarone, will add Toprol since he is a little tachy and hypertensive RESP- acute respiratory failure remains  primary issue.  Left phrenic involved with mass and left diaphragm elevated, right also elevated but not to same degree. Continue pulmonary toilet, BIPAP PRN RENAL- diuresed well ENDO- CBG mildly elevated  Change SSI to [facility] and HS GI- advance diet Mobilize as tolerated   LOS: 5 days    Loreli Slot 10/12/2022

## 2022-10-13 ENCOUNTER — Inpatient Hospital Stay (HOSPITAL_COMMUNITY): Payer: BC Managed Care – PPO

## 2022-10-13 DIAGNOSIS — J9859 Other diseases of mediastinum, not elsewhere classified: Secondary | ICD-10-CM | POA: Diagnosis not present

## 2022-10-13 LAB — GLUCOSE, CAPILLARY
Glucose-Capillary: 157 mg/dL — ABNORMAL HIGH (ref 70–99)
Glucose-Capillary: 148 mg/dL — ABNORMAL HIGH (ref 70–99)
Glucose-Capillary: 118 mg/dL — ABNORMAL HIGH (ref 70–99)
Glucose-Capillary: 151 mg/dL — ABNORMAL HIGH (ref 70–99)

## 2022-10-13 LAB — CBC
HCT: 31.8 % — ABNORMAL LOW (ref 39.0–52.0)
Hemoglobin: 10 g/dL — ABNORMAL LOW (ref 13.0–17.0)
MCH: 29.8 pg (ref 26.0–34.0)
MCHC: 31.4 g/dL (ref 30.0–36.0)
MCV: 94.6 fL (ref 80.0–100.0)
Platelets: 319 10*3/uL (ref 150–400)
RBC: 3.36 MIL/uL — ABNORMAL LOW (ref 4.22–5.81)
RDW: 14.6 % (ref 11.5–15.5)
WBC: 9.3 10*3/uL (ref 4.0–10.5)
nRBC: 0 % (ref 0.0–0.2)

## 2022-10-13 LAB — BASIC METABOLIC PANEL
Anion gap: 10 (ref 5–15)
BUN: 12 mg/dL (ref 6–20)
CO2: 37 mmol/L — ABNORMAL HIGH (ref 22–32)
Calcium: 8.4 mg/dL — ABNORMAL LOW (ref 8.9–10.3)
Chloride: 89 mmol/L — ABNORMAL LOW (ref 98–111)
Creatinine, Ser: 0.84 mg/dL (ref 0.61–1.24)
GFR, Estimated: >60 mL/min (ref >60)
Glucose, Bld: 136 mg/dL — ABNORMAL HIGH (ref 70–99)
Potassium: 3.5 mmol/L (ref 3.5–5.1)
Sodium: 136 mmol/L (ref 135–145)

## 2022-10-13 LAB — MAGNESIUM: Magnesium: 2.2 mg/dL (ref 1.7–2.4)

## 2022-10-13 MED ORDER — FUROSEMIDE 10 MG/ML IJ SOLN
40.0000 mg | Freq: Four times a day (QID) | INTRAMUSCULAR | Status: AC
  Administered 2022-10-13 (×2): 40 mg via INTRAVENOUS
  Filled 2022-10-13 (×2): qty 4

## 2022-10-13 MED ORDER — METFORMIN HCL ER 500 MG PO TB24
500.0000 mg | ORAL_TABLET | Freq: Every day | ORAL | Status: DC
  Administered 2022-10-13 – 2022-10-22 (×9): 500 mg via ORAL
  Filled 2022-10-13 (×12): qty 1

## 2022-10-13 MED ORDER — POTASSIUM CHLORIDE 20 MEQ PO PACK
20.0000 meq | PACK | ORAL | Status: AC
  Administered 2022-10-13 (×3): 20 meq via ORAL
  Filled 2022-10-13 (×3): qty 1

## 2022-10-13 NOTE — Progress Notes (Signed)
6 Days Post-Op Procedure(s) (LRB): XI ROBOTIC ASSISTED BILATERAL RESECTION OF MEDIASTINAL MASS  AND STERNOTOMY (Bilateral) INTERCOSTAL NERVE BLOCK Subjective: On BIPAP, denies pain, breathing "a little better" + tolerating diet, + BM  Objective: Vital signs in last 24 hours: Temp:  [97.8 F (36.6 C)-98.8 F (37.1 C)] 97.8 F (36.6 C) (12/17 0820) Pulse Rate:  [79-101] 79 (12/17 0700) Cardiac Rhythm: Normal sinus rhythm (12/17 0015) Resp:  [9-24] 10 (12/17 0700) BP: (95-164)/(61-86) 121/72 (12/17 0700) SpO2:  [97 %-100 %] 98 % (12/17 0700) FiO2 (%):  [40 %] 40 % (12/17 0400) Weight:  [124.6 kg] 124.6 kg (12/17 0500)  Hemodynamic parameters for last 24 hours:    Intake/Output from previous day: 12/16 0701 - 12/17 0700 In: 1118.7 [P.O.:720; IV Piggyback:398.7] Out: 2650 [Urine:2650] Intake/Output this shift: No intake/output data recorded.  General appearance: alert, cooperative, and no distress Neurologic: intact Heart: regular rate and rhythm Lungs: minimal air movement Abdomen: normal findings: soft, non-tender  Lab Results: Recent Labs    10/12/22 0122 10/13/22 0351  WBC 13.5* 9.3  HGB 11.8* 10.0*  HCT 35.5* 31.8*  PLT 313 319   BMET:  Recent Labs    10/11/22 0552 10/13/22 0351  NA 137 136  K 3.8 3.5  CL 93* 89*  CO2 29 37*  GLUCOSE 147* 136*  BUN 12 12  CREATININE 0.94 0.84  CALCIUM 8.5* 8.4*    PT/INR: No results for input(s): "LABPROT", "INR" in the last 72 hours. ABG    Component Value Date/Time   PHART 7.277 (L) 10/07/2022 1549   HCO3 20.9 10/07/2022 1549   TCO2 22 10/07/2022 1549   TCO2 23 10/07/2022 1549   ACIDBASEDEF 6.0 (H) 10/07/2022 1549   O2SAT 99 10/07/2022 1549   CBG (last 3)  Recent Labs    10/12/22 2113 10/12/22 2158 10/13/22 0635  GLUCAP 159* 137* 157*    Assessment/Plan: S/P Procedure(s) (LRB): XI ROBOTIC ASSISTED BILATERAL RESECTION OF MEDIASTINAL MASS  AND STERNOTOMY (Bilateral) INTERCOSTAL NERVE BLOCK - NEURO-  intact CV- in SR on amiodarone + metoprolol  BP better after adding metoprolol RESP- acute respiratory failure  Left phrenic sacrificed at surgery. Has bilateral elevated diaphragms, minimal air movement on exam.  Concerning for possible right phrenic dysfunction as well. Consider sniff test to eval right diaphragm movement RENAL- creatinine OK- diuresing well  Supplement K ENDO- CBG mildly elevated  Restart metformin  Continue SSI GI- tolerating diet Deconditioning- severe- PT/OT  LOS: 6 days    Loreli Slot 10/13/2022

## 2022-10-13 NOTE — Progress Notes (Signed)
   NAME:  George Wallace, MRN:  [id number], DOB:  July 25, 1961, LOS: 6 ADMISSION DATE:  10/07/2022 CONSULTATION DATE:  12/11 REFERRING MD:  Dr. Cliffton Asters CHIEF COMPLAINT:  resection of mediastinal mass and sternotomy  History of Present Illness:  Patient is a 61 year old male with pertinent PMH of DMT2, HTN, HLD presents to [facility] on 12/11 for resection of mediastinal mass and sternotomy.  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  Biopsies were performed at [facility] that were inconclusive.  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  On 12/11 patient arrived to [facility] for elective surgery.  Resection of mediastinal mass was performed. 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  It was taken en bloc.  Required sternotomy to anatomy at the vein with cessation of bleeding.  Postop transferred to ICU intubated.  PCCM consulted.  Pertinent Medical History:   Past Medical History:  Diagnosis Date   Diabetes (HCC)    Diet controlled   Hyperlipidemia      Significant Hospital Events: Including procedures, antibiotic start and stop dates in addition to other pertinent events   12/11 - s/p sternotomy and resection of mediastinal mass; post op intubated 12/12 extubated 12/13 BIPAP intermittent 12/14 Afib on amio  Interim History / Subjective:  No events. Still having tough time getting off BIPAP.  Objective:  Blood pressure 130/80, pulse 100, temperature 97.8 F (36.6 C), temperature source Oral, resp. rate (!) 22, height 5\' 8"  (1.727 m), weight 124.6 kg, SpO2 100 %.    Vent Mode: PCV;BIPAP;CPAP FiO2 (%):  [40 %] 40 % Set Rate:  [16 bmp] 16 bmp PEEP:  [5 cmH20] 5 cmH20   Intake/Output Summary (Last 24 hours) at 10/13/2022 1039 Last data filed at 10/13/2022 0900 Gross per 24 hour  Intake 818.65 ml  Output 2650 ml  Net -1831.35 ml     Filed Weights   10/11/22 0400 10/12/22 0500 10/13/22 0500  Weight: 127.1 kg 127 kg 124.6 kg    Physical Examination: No distress on BIPAP Able to speak in short sentences Abd soft Sternotomy site CDI without strikethrough No edema   K being repleted CBC stable CXR stable low lung volumes  Resolved Hospital Problem List:    Assessment & Plan:   S/p resection of mediastinal mass and sternotomy Postoperative vent management- resolved Postoperative hypoxemia- related to splinting and L phrenic nerve palsy, edema, question raised of R phrenic nerve issues as well DM2, HTN  - Lasix reordered, K replacement (same as yesterday)  - Mobilization, PT/OT, chair position in bed: appreciate therapist help; for CIR once stronger; really needs to try to use his IS more  - BP management, incision site care, GDMT per primary  - Continue ICU level care until resp status improves  - Will follow while in ICU  10/15/22 MD PCCM

## 2022-10-13 NOTE — Progress Notes (Signed)
      [address]       [place] [address]             [phone]      No new issues  BP 113/68   Pulse 83   Temp 98.5 F (36.9 C) (Axillary)   Resp 11   Ht 5\' 8"  (1.727 m)   Wt 124.6 kg   SpO2 100%   BMI 41.77 kg/m    Intake/Output Summary (Last 24 hours) at 10/13/2022 1702 Last data filed at 10/13/2022 1345 Gross per 24 hour  Intake 778.33 ml  Output 2450 ml  Net -1671.67 ml   Respiratory status remains marginal  10/15/2022 C. Viviann Spare, MD [facility] [phone]

## 2022-10-14 ENCOUNTER — Inpatient Hospital Stay (HOSPITAL_COMMUNITY): Payer: BC Managed Care – PPO

## 2022-10-14 DIAGNOSIS — J9859 Other diseases of mediastinum, not elsewhere classified: Secondary | ICD-10-CM | POA: Diagnosis not present

## 2022-10-14 LAB — BASIC METABOLIC PANEL
Anion gap: 7 (ref 5–15)
BUN: 13 mg/dL (ref 6–20)
CO2: 42 mmol/L — ABNORMAL HIGH (ref 22–32)
Calcium: 8.6 mg/dL — ABNORMAL LOW (ref 8.9–10.3)
Chloride: 88 mmol/L — ABNORMAL LOW (ref 98–111)
Creatinine, Ser: 0.87 mg/dL (ref 0.61–1.24)
GFR, Estimated: >60 mL/min (ref >60)
Glucose, Bld: 131 mg/dL — ABNORMAL HIGH (ref 70–99)
Potassium: 3.6 mmol/L (ref 3.5–5.1)
Sodium: 137 mmol/L (ref 135–145)

## 2022-10-14 LAB — CBC
HCT: 30.9 % — ABNORMAL LOW (ref 39.0–52.0)
Hemoglobin: 9.5 g/dL — ABNORMAL LOW (ref 13.0–17.0)
MCH: 29.5 pg (ref 26.0–34.0)
MCHC: 30.7 g/dL (ref 30.0–36.0)
MCV: 96 fL (ref 80.0–100.0)
Platelets: 332 10*3/uL (ref 150–400)
RBC: 3.22 MIL/uL — ABNORMAL LOW (ref 4.22–5.81)
RDW: 14.4 % (ref 11.5–15.5)
WBC: 10.2 10*3/uL (ref 4.0–10.5)
nRBC: 0 % (ref 0.0–0.2)

## 2022-10-14 LAB — GLUCOSE, CAPILLARY
Glucose-Capillary: 141 mg/dL — ABNORMAL HIGH (ref 70–99)
Glucose-Capillary: 121 mg/dL — ABNORMAL HIGH (ref 70–99)
Glucose-Capillary: 114 mg/dL — ABNORMAL HIGH (ref 70–99)
Glucose-Capillary: 151 mg/dL — ABNORMAL HIGH (ref 70–99)

## 2022-10-14 LAB — MAGNESIUM: Magnesium: 2.2 mg/dL (ref 1.7–2.4)

## 2022-10-14 MED ORDER — POTASSIUM CHLORIDE CRYS ER 20 MEQ PO TBCR
40.0000 meq | EXTENDED_RELEASE_TABLET | Freq: Once | ORAL | Status: AC
  Administered 2022-10-14: 40 meq via ORAL
  Filled 2022-10-14: qty 2

## 2022-10-14 MED ORDER — FUROSEMIDE 10 MG/ML IJ SOLN
40.0000 mg | Freq: Once | INTRAMUSCULAR | Status: AC
  Administered 2022-10-14: 40 mg via INTRAVENOUS
  Filled 2022-10-14: qty 4

## 2022-10-14 MED ORDER — ACETAZOLAMIDE 250 MG PO TABS
250.0000 mg | ORAL_TABLET | Freq: Two times a day (BID) | ORAL | Status: AC
  Administered 2022-10-14 (×2): 250 mg via ORAL
  Filled 2022-10-14 (×2): qty 1

## 2022-10-14 NOTE — Progress Notes (Signed)
      [address]       [place] [address]             [phone]                 7 Days Post-Op Procedure(s) (LRB): XI ROBOTIC ASSISTED BILATERAL RESECTION OF MEDIASTINAL MASS  AND STERNOTOMY (Bilateral) INTERCOSTAL NERVE BLOCK   Events: No events Tolerating longer stents off bipap _______________________________________________________________ Vitals: BP 137/74   Pulse 81   Temp 98.9 F (37.2 C) (Oral)   Resp 14   Ht 5\' 8"  (1.727 m)   Wt 124.6 kg   SpO2 99%   BMI 41.77 kg/m  Filed Weights   10/11/22 0400 10/12/22 0500 10/13/22 0500  Weight: 127.1 kg 127 kg 124.6 kg     - Neuro: alert NAd  - Cardiovascular: sinus  Drips: none.      - Pulm:  Vent Mode: BIPAP FiO2 (%):  [40 %] 40 % Set Rate:  [16 bmp] 16 bmp PEEP:  [5 cmH20] 5 cmH20  ABG    Component Value Date/Time   PHART 7.277 (L) 10/07/2022 1549   PCO2ART 44.1 10/07/2022 1549   PO2ART 170 (H) 10/07/2022 1549   HCO3 20.9 10/07/2022 1549   TCO2 22 10/07/2022 1549   TCO2 23 10/07/2022 1549   ACIDBASEDEF 6.0 (H) 10/07/2022 1549   O2SAT 99 10/07/2022 1549    - Abd: ND - Extremity: warm  .Intake/Output      12/17 0701 12/18 0700 12/18 0701 12/19 0700   P.O. 670    IV Piggyback     Total Intake(mL/kg) 670 (5.4)    Urine (mL/kg/hr) 2300 (0.8)    Total Output 2300    Net -1630            _______________________________________________________________ Labs:    Latest Ref Rng & Units 10/14/2022    1:33 AM 10/13/2022    3:51 AM 10/12/2022    1:22 AM  CBC  WBC 4.0 - 10.5 K/uL 10.2  9.3  13.5   Hemoglobin 13.0 - 17.0 g/dL 9.5  10/14/2022  20.9   Hematocrit 39.0 - 52.0 % 30.9  31.8  35.5   Platelets 150 - 400 K/uL 332  319  313       Latest Ref Rng & Units 10/14/2022    1:33 AM 10/13/2022    3:51 AM 10/11/2022    5:52 AM  CMP  Glucose 70 - 99 mg/dL 10/13/2022  962  836   BUN 6 - 20 mg/dL 13  12  12    Creatinine 0.61 - 1.24 mg/dL 629   4.76   Sodium 135 - 145 mmol/L 137   136  137   Potassium 3.5 - 5.1 mmol/L 3.6  3.5  3.8   Chloride 98 - 111 mmol/L 88  89  93   CO2 22 - 32 mmol/L 42  37  29   Calcium 8.9 - 10.3 mg/dL 8.6  8.4  8.5     CXR: stable  _______________________________________________________________  Assessment and Plan: POD 7 s/p mediastinal mass resection  Neuro: pain controlled CV: stable Pulm: continue pulm hygiene.  Will plan for L plication later this week Renal: stable GI: on diet Heme: stable ID: [email or web address] Endo: SSI Dispo: continue ICU care   5.46 10/14/2022 8:18 AM

## 2022-10-14 NOTE — Progress Notes (Signed)
Physical Therapy Treatment Patient Details Name: George Wallace MRN: [id number] DOB: Jan 06, 1961 Today's Date: 10/14/2022   History of Present Illness Pt is George 62 y.o. 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 Pt requiring intermittent NIV post-op. plan for possible return to OR Wed, 12/20. 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    PT Comments    Patient progressing slowly towards PT goals. Session focused on therapeutic exercise and functional transfers. Tolerated standing bouts x3 and marching in place, increasing time each bout. Follows sternal precautions well but needs Min George to steady in standing due to posterior bias. Sp02 dropped to 87% on 10 L HHFNC with activity, RR up to 36, recovers quickly with seated rest break. Will be able to progress to gait training with chair follow next session. Coordinate with RT to mobilize on HHFNC. Reviewed importance of sitting in chair and there ex of Les. Will follow.   Recommendations for follow up therapy are one component of George multi-disciplinary discharge planning process, led by the attending physician.  Recommendations may be updated based on patient status, additional functional criteria and insurance authorization.  Follow Up Recommendations  Acute inpatient rehab (3hours/day)     Assistance Recommended at Discharge Frequent or constant Supervision/Assistance  Patient can return home with the following George little help with walking and/or transfers;George little help with bathing/dressing/bathroom;Assistance with cooking/housework;Assist for transportation;Help with stairs or ramp for entrance   Equipment Recommendations  Other (comment) (TBA)    Recommendations for Other Services       Precautions / Restrictions Precautions Precautions:  Fall;Sternal;Other (comment) Precaution Booklet Issued: No Precaution Comments: [place] 10L Restrictions Weight Bearing Restrictions: No RUE Weight Bearing: Non weight bearing LUE Weight Bearing: Non weight bearing Other Position/Activity Restrictions: UE sternal percautions     Mobility  Bed Mobility               General bed mobility comments: OOB upon arrival    Transfers Overall transfer level: Needs assistance Equipment used: None Transfers: Sit to/from Stand Sit to Stand: Min assist           General transfer comment: Min George to steady in standing due to posterior bias once upright, stood from chair x3 holding heart pillow with use of momentum.    Ambulation/Gait Ambulation/Gait assistance: Min guard           Pre-gait activities: Able to perform marching in place 15 sec, 20 sec and 30 sec with close Min guard. Sp02 dropped to 87% on 10L HHFNC, recovers quickly with seated rest breaks.     Stairs             Wheelchair Mobility    Modified Rankin (Stroke Patients Only)       Balance Overall balance assessment: Needs assistance Sitting-balance support: Feet supported, No upper extremity supported Sitting balance-Leahy Scale: Good     Standing balance support: During functional activity Standing balance-Leahy Scale: Fair Standing balance comment: Up to minA to stand without UE support                            Cognition Arousal/Alertness: Awake/alert Behavior During Therapy: WFL for tasks assessed/performed Overall Cognitive Status: Within Functional Limits for tasks assessed  General Comments: laughing, joking, following all commands        Exercises      General Comments General comments (skin integrity, edema, etc.): Wife present. Sp02 dropped to 87% on 10 L HHFNC with activity, RR up to 36, recovers quickly.      Pertinent Vitals/Pain Pain Assessment Pain Assessment:  No/denies pain    Home Living                          Prior Function            PT Goals (current goals can now be found in the care plan section) Progress towards PT goals: Progressing toward goals (slowly)    Frequency    Min 3X/week      PT Plan Current plan remains appropriate    Co-evaluation PT/OT/SLP Co-Evaluation/Treatment: Yes Reason for Co-Treatment: Complexity of the patient's impairments (multi-system involvement);For patient/therapist safety PT goals addressed during session: Mobility/safety with mobility;Balance OT goals addressed during session: ADL's and self-care      AM-PAC PT "6 Clicks" Mobility   Outcome Measure  Help needed turning from your back to your side while in George flat bed without using bedrails?: George Lot Help needed moving from lying on your back to sitting on the side of George flat bed without using bedrails?: George Lot Help needed moving to and from George bed to George chair (including George wheelchair)?: George Little Help needed standing up from George chair using your arms (e.g., wheelchair or bedside chair)?: George Little Help needed to walk in hospital room?: Total Help needed climbing 3-5 steps with George railing? : Total 6 Click Score: 12    End of Session Equipment Utilized During Treatment: Oxygen Activity Tolerance: Patient tolerated treatment well;Patient limited by fatigue Patient left: in chair;with call bell/phone within reach;with family/visitor present Nurse Communication: Mobility status PT Visit Diagnosis: Other abnormalities of gait and mobility (R26.89);Muscle weakness (generalized) (M62.81);Unsteadiness on feet (R26.81)     Time: 9604-5409 PT Time Calculation (min) (ACUTE ONLY): 26 min  Charges:  $Therapeutic Exercise: 8-22 mins                     George Wallace, PT, DPT Acute Rehabilitation Services Secure chat preferred Office [phone]      George Wallace George Wallace 10/14/2022, 11:17 AM

## 2022-10-14 NOTE — Progress Notes (Signed)
Occupational Therapy Treatment Patient Details Name: George Wallace MRN: [id number] DOB: 10-Sep-1961 Today's Date: 10/14/2022   History of present illness Pt is a 61 y.o. 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 Pt requiring intermittent NIV post-op. 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   OT comments  George Wallace is making steady progress, he continues to be motivated to participate and demonstrates good carryover of education throughout session. Pt seen with +2 for safety and tolerance. Overall he was able to stand from the chair 3x and march in place for 10-30 seconds with min G-min A and good adherence to sternal precautions. RR to 36 with quick recovery in sitting, otherwise VSS on 10L HHF. OT to continue to follow. POC remains appropriate.    Recommendations for follow up therapy are one component of a multi-disciplinary discharge planning process, led by the attending physician.  Recommendations may be updated based on patient status, additional functional criteria and insurance authorization.    Follow Up Recommendations  Acute inpatient rehab (3hours/day)     Assistance Recommended at Discharge Frequent or constant Supervision/Assistance  Patient can return home with the following  A little help with walking and/or transfers;Assistance with cooking/housework;Assist for transportation;Help with stairs or ramp for entrance;A lot of help with bathing/dressing/bathroom   Equipment Recommendations  None recommended by OT       Precautions / Restrictions Precautions Precautions: Fall;Sternal;Other (comment) Precaution Booklet Issued: No Precaution Comments: chest tube; HHF O2 vs BiPAP Restrictions Weight Bearing Restrictions: No Other Position/Activity Restrictions: UE sternal  percautions       Mobility Bed Mobility               General bed mobility comments: OOB upon arrival    Transfers Overall transfer level: Needs assistance Equipment used: None Transfers: Sit to/from Stand Sit to Stand: Min assist           General transfer comment: maintained sternal precautions well. Very light min A for sit<>stand     Balance Overall balance assessment: Needs assistance Sitting-balance support: Feet supported Sitting balance-Leahy Scale: Good     Standing balance support: No upper extremity supported Standing balance-Leahy Scale: Fair                             ADL either performed or assessed with clinical judgement   ADL Overall ADL's : Needs assistance/impaired     Grooming: Set up;Sitting                               Functional mobility during ADLs: Minimal assistance;Cueing for safety;Cueing for sequencing General ADL Comments: session focused on activity tolerance and energy conservation techniques    Extremity/Trunk Assessment Upper Extremity Assessment Upper Extremity Assessment: Generalized weakness   Lower Extremity Assessment Lower Extremity Assessment: Generalized weakness        Vision   Vision Assessment?: No apparent visual deficits   Perception Perception Perception: Not tested   Praxis Praxis Praxis: Not tested    Cognition Arousal/Alertness: Awake/alert Behavior During Therapy: WFL for tasks assessed/performed Overall Cognitive Status: Within Functional Limits for tasks assessed  General Comments: laughing, joking, following all commands              General Comments VSS on HHF 10L. RR to 36, recovered quickly with sitting rest break    Pertinent Vitals/ Pain       Pain Assessment Pain Assessment: No/denies pain Pain Intervention(s): Monitored during session   Frequency  Min 2X/week        Progress Toward  Goals  OT Goals(current goals can now be found in the care plan section)  Progress towards OT goals: Progressing toward goals  Acute Rehab OT Goals Patient Stated Goal: to walk OT Goal Formulation: With patient Time For Goal Achievement: 10/22/22 Potential to Achieve Goals: Good ADL Goals Pt Will Perform Grooming: with min guard assist;standing Pt Will Perform Upper Body Dressing: with supervision;sitting Pt Will Perform Lower Body Dressing: with min guard assist;sit to/from stand Pt Will Transfer to Toilet: with supervision;ambulating Pt Will Perform Toileting - Clothing Manipulation and hygiene: with supervision;sitting/lateral leans  Plan Discharge plan remains appropriate    Co-evaluation    PT/OT/SLP Co-Evaluation/Treatment: Yes Reason for Co-Treatment: Complexity of the patient's impairments (multi-system involvement);For patient/therapist safety;To address functional/ADL transfers   OT goals addressed during session: ADL's and self-care      AM-PAC OT "6 Clicks" Daily Activity     Outcome Measure   Help from another person eating meals?: None Help from another person taking care of personal grooming?: A Little Help from another person toileting, which includes using toliet, bedpan, or urinal?: A Lot Help from another person bathing (including washing, rinsing, drying)?: A Lot Help from another person to put on and taking off regular upper body clothing?: A Little Help from another person to put on and taking off regular lower body clothing?: A Lot 6 Click Score: 16    End of Session Equipment Utilized During Treatment: Oxygen  OT Visit Diagnosis: Unsteadiness on feet (R26.81);Other abnormalities of gait and mobility (R26.89);Repeated falls (R29.6);Muscle weakness (generalized) (M62.81)   Activity Tolerance Patient tolerated treatment well   Patient Left in chair;with call bell/phone within reach;with chair alarm set   Nurse Communication Mobility status;Other  (comment)        Time: 2952-8413 OT Time Calculation (min): 28 min  Charges: OT General Charges $OT Visit: 1 Visit OT Treatments $Therapeutic Activity: 8-22 mins   Donia Pounds 10/14/2022, 9:56 AM

## 2022-10-14 NOTE — Progress Notes (Signed)
   NAME:  George Wallace, MRN:  [id number], DOB:  December 08, 1960, LOS: 7 ADMISSION DATE:  10/07/2022 CONSULTATION DATE:  12/11 REFERRING MD:  Dr. Cliffton Asters CHIEF COMPLAINT:  resection of mediastinal mass and sternotomy  History of Present Illness:  Patient is a 61 year old male with pertinent PMH of DMT2, HTN, HLD presents to [facility] on 12/11 for resection of mediastinal mass and sternotomy.  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  Biopsies were performed at [facility] that were inconclusive.  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  On 12/11 patient arrived to [facility] for elective surgery.  Resection of mediastinal mass was performed. 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  It was taken en bloc.  Required sternotomy to anatomy at the vein with cessation of bleeding.  Postop transferred to ICU intubated.  PCCM consulted.  Pertinent Medical History:   Past Medical History:  Diagnosis Date   Diabetes (HCC)    Diet controlled   Hyperlipidemia      Significant Hospital Events: Including procedures, antibiotic start and stop dates in addition to other pertinent events   12/11 - s/p sternotomy and resection of mediastinal mass; post op intubated 12/12 extubated 12/13 BIPAP intermittent 12/14 Afib on amio  Interim History / Subjective:  No events. Still intermittent bipap dependence.  Objective:  Blood pressure (!) 153/81, pulse (!) 102, temperature 98.6 F (37 C), temperature source Oral, resp. rate 19, height 5\' 8"  (1.727 m), weight 124.6 kg, SpO2 100 %.    Vent Mode: BIPAP FiO2 (%):  [40 %-50 %] 50 % Set Rate:  [16 bmp] 16 bmp PEEP:  [5 cmH20] 5 cmH20   Intake/Output Summary (Last 24 hours) at 10/14/2022 0933 Last data filed at 10/14/2022 0000 Gross per 24 hour  Intake 550 ml  Output 2300 ml  Net -1750 ml    Filed Weights    10/11/22 0400 10/12/22 0500 10/13/22 0500  Weight: 127.1 kg 127 kg 124.6 kg    Physical Examination: No acute distress sitting up in bed Poor air movement bilaterally Abd soft, +BS No edema Sternotomy site looks CDI  Renal function okay K repleting Some contraction alkalosis  Resolved Hospital Problem List:    Assessment & Plan:   S/p resection of mediastinal mass and sternotomy Postoperative vent management- resolved Postoperative hypoxemia- related to splinting and L phrenic nerve palsy, edema, question raised of R phrenic nerve issues as well DM2, HTN  - Replete K, redose lasix and give some diamox  - Mobilization, PT/OT, chair position in bed: appreciate therapist help; for CIR once stronger; really needs to try to use his IS more  - BP management, incision site care, GDMT per primary  - Continue ICU level care until resp status improves  - May be going for L hemidiaphragm plication later in week  - Will follow  10/15/22 MD PCCM

## 2022-10-14 NOTE — Progress Notes (Signed)
Patient ID: George Wallace, male   DOB: 1961/06/20, 61 y.o.   MRN: [id number] TCTS Evening Rounds:  Hemodynamically stable in sinus rhythm.  Up in chair on HFNC but was on bipap some today.  -1370 cc so far today.  Working on IS but only low volume.

## 2022-10-14 NOTE — Progress Notes (Signed)
Inpatient Rehab Admissions Coordinator:   Per therapy recommendations,  patient was screened for CIR candidacy by Duante Arocho, MS, CCC-SLP . At this time, Pt. Appears to be a a potential candidate for CIR. I will request   order for rehab consult per protocol for full assessment. Please contact me any with questions.  Montario Zilka, MS, CCC-SLP Rehab Admissions Coordinator  [phone] (celll) [phone] (office)  

## 2022-10-15 DIAGNOSIS — J986 Disorders of diaphragm: Secondary | ICD-10-CM | POA: Diagnosis not present

## 2022-10-15 DIAGNOSIS — J810 Acute pulmonary edema: Secondary | ICD-10-CM

## 2022-10-15 DIAGNOSIS — J9601 Acute respiratory failure with hypoxia: Secondary | ICD-10-CM

## 2022-10-15 DIAGNOSIS — J9859 Other diseases of mediastinum, not elsewhere classified: Secondary | ICD-10-CM | POA: Diagnosis not present

## 2022-10-15 LAB — CBC
HCT: 34.1 % — ABNORMAL LOW (ref 39.0–52.0)
Hemoglobin: 11 g/dL — ABNORMAL LOW (ref 13.0–17.0)
MCH: 29.9 pg (ref 26.0–34.0)
MCHC: 32.3 g/dL (ref 30.0–36.0)
MCV: 92.7 fL (ref 80.0–100.0)
Platelets: 389 10*3/uL (ref 150–400)
RBC: 3.68 MIL/uL — ABNORMAL LOW (ref 4.22–5.81)
RDW: 14.1 % (ref 11.5–15.5)
WBC: 12.9 10*3/uL — ABNORMAL HIGH (ref 4.0–10.5)
nRBC: 0 % (ref 0.0–0.2)

## 2022-10-15 LAB — GLUCOSE, CAPILLARY
Glucose-Capillary: 129 mg/dL — ABNORMAL HIGH (ref 70–99)
Glucose-Capillary: 165 mg/dL — ABNORMAL HIGH (ref 70–99)
Glucose-Capillary: 128 mg/dL — ABNORMAL HIGH (ref 70–99)
Glucose-Capillary: 148 mg/dL — ABNORMAL HIGH (ref 70–99)

## 2022-10-15 LAB — BASIC METABOLIC PANEL
Anion gap: 11 (ref 5–15)
BUN: 16 mg/dL (ref 6–20)
CO2: 28 mmol/L (ref 22–32)
Calcium: 9.2 mg/dL (ref 8.9–10.3)
Chloride: 95 mmol/L — ABNORMAL LOW (ref 98–111)
Creatinine, Ser: 0.99 mg/dL (ref 0.61–1.24)
GFR, Estimated: >60 mL/min (ref >60)
Glucose, Bld: 136 mg/dL — ABNORMAL HIGH (ref 70–99)
Potassium: 3.7 mmol/L (ref 3.5–5.1)
Sodium: 134 mmol/L — ABNORMAL LOW (ref 135–145)

## 2022-10-15 LAB — MAGNESIUM: Magnesium: 2.3 mg/dL (ref 1.7–2.4)

## 2022-10-15 MED ORDER — ARFORMOTEROL TARTRATE 15 MCG/2ML IN NEBU
15.0000 ug | INHALATION_SOLUTION | Freq: Two times a day (BID) | RESPIRATORY_TRACT | Status: DC
  Administered 2022-10-15 – 2022-10-22 (×15): 15 ug via RESPIRATORY_TRACT
  Filled 2022-10-15 (×17): qty 2

## 2022-10-15 MED ORDER — FUROSEMIDE 10 MG/ML IJ SOLN
40.0000 mg | Freq: Once | INTRAMUSCULAR | Status: AC
  Administered 2022-10-15: 40 mg via INTRAVENOUS
  Filled 2022-10-15: qty 4

## 2022-10-15 MED ORDER — BUDESONIDE 0.25 MG/2ML IN SUSP
0.2500 mg | Freq: Two times a day (BID) | RESPIRATORY_TRACT | Status: DC
  Administered 2022-10-15 – 2022-10-22 (×15): 0.25 mg via RESPIRATORY_TRACT
  Filled 2022-10-15 (×16): qty 2

## 2022-10-15 MED ORDER — ORAL CARE MOUTH RINSE: 15.0000 mL | OROMUCOSAL | Status: DC | PRN

## 2022-10-15 MED FILL — Sodium Chloride Irrigation Soln 0.9%: Qty: 3000 | Status: AC

## 2022-10-15 MED FILL — Sodium Chloride IV Soln 0.9%: INTRAVENOUS | Qty: 1000 | Status: AC

## 2022-10-15 MED FILL — Heparin Sodium (Porcine) Inj 1000 Unit/ML: INTRAMUSCULAR | Qty: 30 | Status: AC

## 2022-10-15 NOTE — Progress Notes (Signed)
NAME:  George Wallace, MRN:  [id number], DOB:  Nov 19, 1960, LOS: 8 ADMISSION DATE:  10/07/2022 CONSULTATION DATE:  12/11 REFERRING MD:  Dr. Kipp Brood CHIEF COMPLAINT:  resection of mediastinal mass and sternotomy  History of Present Illness:  Patient is a 61 year old male with pertinent PMH of DMT2, HTN, HLD presents to [facility] on 12/11 for resection of mediastinal mass and sternotomy.  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  Biopsies were performed at [facility] that were inconclusive.  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  On 12/11 patient arrived to [facility] for elective surgery.  Resection of mediastinal mass was performed. 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  It was taken en bloc.  Required sternotomy to anatomy at the vein with cessation of bleeding.  Postop transferred to ICU intubated.  PCCM consulted.  Pertinent Medical History:   Past Medical History:  Diagnosis Date   Diabetes ([place])    Diet controlled   Hyperlipidemia      Significant Hospital Events:  12/11 - s/p sternotomy and resection of mediastinal mass; post op intubated 12/12 extubated 12/13 BIPAP intermittent 12/14 Afib on amio  Interim History / Subjective:  This morning denies new complaints.  Still feels short of breath at times.  At baseline sleeps in a chair on multiple pillows frequently because that is how he falls asleep, not because of dyspnea when lying flat.  Objective:  Blood pressure 125/70, pulse 79, temperature 98 F (36.7 C), temperature source Axillary, resp. rate 20, height 5\' 8"  (1.727 m), weight 125.5 kg, SpO2 100 %.    Vent Mode: BIPAP FiO2 (%):  [30 %-50 %] 40 % Set Rate:  [16 bmp] 16 bmp Vt Set:  [540 mL] 540 mL PEEP:  [5 cmH20] 5 cmH20   Intake/Output Summary (Last 24 hours) at 10/15/2022 0719 Last data filed at 10/15/2022  0400 Gross per 24 hour  Intake 630 ml  Output 2650 ml  Net -2020 ml    Filed Weights   10/12/22 0500 10/13/22 0500 10/15/22 0500  Weight: 127 kg 124.6 kg 125.5 kg    Physical Examination: General: Middle-age man sitting up in the chair no acute distress HEENT: Hurt/AT, eyes anicteric Respiratory: Breathing comfortably on heated high flow, reduced basilar breath sounds, otherwise CTAB.  No conversational dyspnea. Cardio: S1-S2, regular rate and rhythm Abdomen: Obese, soft, nontender Extremities: Mild edema, no cyanosis Neuro: Awake, alert, moving all extremities, no focal deficits.  Normal speech.  WBC 12.9 H/H 11/34.1 CXR personally reviewed-low lung volumes bilaterally, especially on the left.  Pulmonary edema   Resolved Hospital Problem List:    Assessment & Plan:   Thymic mass, s/p resection Acute respiratory failure with hypoxia, postoperative hypoxemia- related to splinting and L phrenic nerve palsy, acute pulmonary edema.  Seems less likely that he also has a right phrenic nerve issue, but remains possible. 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 Will need to go home with BiPAP - case management consult. -No plans for hemidiaphragm plication this admission.  Hopefully will be able to be done as an outpatient. Garlon Hatchet, reports that Advair at home had helped him.  Afib post-operatively -amiodarone to maintain NSR  HTN -Continue Toprol-XL, lisinopril  DM2 -con't metformin -SSI PRN -  goal BG 140-180  BPH - Continue tamsulosin  obesity -Long-term recommend weight loss  Wife updated at bedside during rounds.  Steffanie Dunn, DO 10/15/22 7:21 AM [facility]  For contact information, see Amion. If no response to pager, please call PCCM consult pager. After hours, 7PM- 7AM,  please call Elink.

## 2022-10-15 NOTE — Progress Notes (Signed)
     [address]       [place] [address]             [phone]       EVENING ROUNDS  SP mediastinal mass resection Resp insufficiency requiring BIPAP Stable

## 2022-10-15 NOTE — Progress Notes (Signed)
RT and RN walked 1 lap around the unit on HHFNC 30L/40% with no complications. Pt placed on documented Bipap setting after his walk for the night per [facility] BiPAP orders.

## 2022-10-15 NOTE — Progress Notes (Signed)
Pt requested to get off Bipap at this time, pt is going from the bed to the recliner. Pt placed back on previous HHFNC 30L/40. SpO2 100%

## 2022-10-15 NOTE — Progress Notes (Signed)
[address]       [place] [address]             [phone]                 8 Days Post-Op Procedure(s) (LRB): XI ROBOTIC ASSISTED BILATERAL RESECTION OF MEDIASTINAL MASS  AND STERNOTOMY (Bilateral) INTERCOSTAL NERVE BLOCK   Events: No events Breathing better in seated postion _______________________________________________________________ Vitals: BP 120/73   Pulse 83   Temp 98.2 F (36.8 C) (Oral)   Resp 17   Ht 5\' 8"  (1.727 m)   Wt 125.5 kg   SpO2 100%   BMI 42.07 kg/m  Filed Weights   10/12/22 0500 10/13/22 0500 10/15/22 0500  Weight: 127 kg 124.6 kg 125.5 kg     - Neuro: alert NAd  - Cardiovascular: sinus  Drips: none.      - Pulm:  Vent Mode: BIPAP FiO2 (%):  [30 %-40 %] 30 % Set Rate:  [16 bmp] 16 bmp Vt Set:  [540 mL] 540 mL PEEP:  [5 cmH20] 5 cmH20  ABG    Component Value Date/Time   PHART 7.277 (L) 10/07/2022 1549   PCO2ART 44.1 10/07/2022 1549   PO2ART 170 (H) 10/07/2022 1549   HCO3 20.9 10/07/2022 1549   TCO2 22 10/07/2022 1549   TCO2 23 10/07/2022 1549   ACIDBASEDEF 6.0 (H) 10/07/2022 1549   O2SAT 99 10/07/2022 1549    - Abd: ND - Extremity: warm  .Intake/Output      12/18 0701 12/19 0700 12/19 0701 12/20 0700   P.O. 540 360   I.V. (mL/kg) 100 (0.8) 30 (0.2)   Total Intake(mL/kg) 640 (5.1) 390 (3.1)   Urine (mL/kg/hr) 2650 (0.9) 1225 (1.3)   Stool  0   Total Output 2650 1225   Net -2010 -835        Urine Occurrence 1 x    Stool Occurrence  1 x      _______________________________________________________________ Labs:    Latest Ref Rng & Units 10/15/2022    6:08 AM 10/14/2022    1:33 AM 10/13/2022    3:51 AM  CBC  WBC 4.0 - 10.5 K/uL 12.9  10.2  9.3   Hemoglobin 13.0 - 17.0 g/dL 10/15/2022  9.5  67.2   Hematocrit 39.0 - 52.0 % 34.1  30.9  31.8   Platelets 150 - 400 K/uL 389  332  319       Latest Ref Rng & Units 10/15/2022    6:08 AM 10/14/2022    1:33 AM 10/13/2022    3:51 AM  CMP   Glucose 70 - 99 mg/dL 10/15/2022  709  628   BUN 6 - 20 mg/dL 16  13  12    Creatinine 0.61 - 1.24 mg/dL 366   2.94   Sodium 135 - 145 mmol/L 134  137  136   Potassium 3.5 - 5.1 mmol/L 3.7  3.6  3.5   Chloride 98 - 111 mmol/L 95  88  89   CO2 22 - 32 mmol/L 28  42  37   Calcium 8.9 - 10.3 mg/dL 9.2  8.6  8.4     CXR: stable  _______________________________________________________________  Assessment and Plan: POD 8 s/p mediastinal mass resection  Neuro: pain controlled CV: stable Pulm: continue pulm hygiene.  Will hold off on plication for now.  Continue PT Renal: stable GI: on diet Heme: stable ID: [email or web address] Endo: SSI Dispo: continue ICU care  Corliss Skains 10/15/2022 2:31 PM

## 2022-10-15 NOTE — TOC Initial Note (Signed)
Transition of Care [facility]) - Initial/Assessment Note    Patient Details  Name: George Wallace MRN: [id number] Date of Birth: [birth year]  Transition of Care [facility]) CM/SW Contact:    Elliot Cousin, RN Phone Number: [phone] 10/15/2022, 5:40 PM  Clinical Narrative:                 Spoke to pt and lives at home with wife. He was working prior to admission. States his disability paperwork is in process. Will continue to follow for dc needs.   Expected Discharge Plan: Home/Self Care Barriers to Discharge: Continued Medical Work up   Patient Goals and CMS Choice        Expected Discharge Plan and Services Expected Discharge Plan: Home/Self Care   Discharge Planning Services: CM Consult   Living arrangements for the past 2 months: Single Family Home                                      Prior Living Arrangements/Services Living arrangements for the past 2 months: Single Family Home Lives with:: Spouse Patient language and need for interpreter reviewed:: Yes        Need for Family Participation in Patient Care: Yes (Comment) Care giver support system in place?: Yes (comment)   Criminal Activity/Legal Involvement Pertinent to Current Situation/Hospitalization: No - Comment as needed  Activities of Daily Living Home Assistive Devices/Equipment: None ADL Screening (condition at time of admission) Patient's cognitive ability adequate to safely complete daily activities?: Yes Is the patient deaf or have difficulty hearing?: No Does the patient have difficulty seeing, even when wearing glasses/contacts?: No Does the patient have difficulty concentrating, remembering, or making decisions?: No Patient able to express need for assistance with ADLs?: Yes Does the patient have difficulty dressing or bathing?: No Independently performs ADLs?: Yes (appropriate for developmental age) Does the patient have difficulty walking or climbing stairs?: No Weakness of Legs:  None Weakness of Arms/Hands: None  Permission Sought/Granted Permission sought to share information with : Case Manager, Family Supports Permission granted to share information with : Yes, Verbal Permission Granted              Emotional Assessment Appearance:: Appears stated age Attitude/Demeanor/Rapport: Engaged Affect (typically observed): Accepting Orientation: : Oriented to Self, Oriented to Place, Oriented to  Time, Oriented to Situation   Psych Involvement: No (comment)  Admission diagnosis:  Mediastinal mass [J98.59] Patient Active Problem List   Diagnosis Date Noted   Acute respiratory failure (HCC) 10/07/2022   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   Type 2 diabetes mellitus without complication, without long-term current use of insulin (HCC) 10/07/2022   Hardening of the aorta (main artery of the heart) (HCC) 06/07/2022   Excessive sleepiness 06/07/2022   Hyperlipidemia, unspecified 06/07/2022   Inattention 06/07/2022   Obstructive sleep apnea (adult) (pediatric) 06/07/2022   Personal history of colonic polyps 06/07/2022   Type 2 diabetes mellitus with other specified complication (HCC) 06/07/2022   Vitamin D deficiency 06/07/2022   Mediastinal mass 05/31/2022   Intermittent asthma without complication 03/04/2015   Cardiomegaly 12/06/2014   Wheeze 12/06/2014   Morbid obesity (HCC) 12/06/2014   PCP:  Gweneth Dimitri, MD Pharmacy:   [phone] Otto, [place] - [address] [place] [address] Phone: [phone] Fax: [phone]  [facility] - [place], [place] - [address] [phone]  [place] [address] Phone: [phone] Fax: [phone]     Social Determinants of Health (SDOH) Interventions    Readmission Risk Interventions     No data to display

## 2022-10-16 LAB — BASIC METABOLIC PANEL
Anion gap: 12 (ref 5–15)
BUN: 18 mg/dL (ref 6–20)
CO2: 29 mmol/L (ref 22–32)
Calcium: 9.1 mg/dL (ref 8.9–10.3)
Chloride: 94 mmol/L — ABNORMAL LOW (ref 98–111)
Creatinine, Ser: 0.96 mg/dL (ref 0.61–1.24)
GFR, Estimated: >60 mL/min (ref >60)
Glucose, Bld: 152 mg/dL — ABNORMAL HIGH (ref 70–99)
Potassium: 3.3 mmol/L — ABNORMAL LOW (ref 3.5–5.1)
Sodium: 135 mmol/L (ref 135–145)

## 2022-10-16 LAB — GLUCOSE, CAPILLARY
Glucose-Capillary: 149 mg/dL — ABNORMAL HIGH (ref 70–99)
Glucose-Capillary: 193 mg/dL — ABNORMAL HIGH (ref 70–99)
Glucose-Capillary: 134 mg/dL — ABNORMAL HIGH (ref 70–99)
Glucose-Capillary: 114 mg/dL — ABNORMAL HIGH (ref 70–99)

## 2022-10-16 LAB — CBC
HCT: 34 % — ABNORMAL LOW (ref 39.0–52.0)
Hemoglobin: 11.5 g/dL — ABNORMAL LOW (ref 13.0–17.0)
MCH: 30.6 pg (ref 26.0–34.0)
MCHC: 33.8 g/dL (ref 30.0–36.0)
MCV: 90.4 fL (ref 80.0–100.0)
Platelets: 437 10*3/uL — ABNORMAL HIGH (ref 150–400)
RBC: 3.76 MIL/uL — ABNORMAL LOW (ref 4.22–5.81)
RDW: 14.1 % (ref 11.5–15.5)
WBC: 13.2 10*3/uL — ABNORMAL HIGH (ref 4.0–10.5)
nRBC: 0 % (ref 0.0–0.2)

## 2022-10-16 LAB — MAGNESIUM: Magnesium: 2.2 mg/dL (ref 1.7–2.4)

## 2022-10-16 MED ORDER — FUROSEMIDE 10 MG/ML IJ SOLN
40.0000 mg | Freq: Once | INTRAMUSCULAR | Status: AC
  Administered 2022-10-16: 40 mg via INTRAVENOUS
  Filled 2022-10-16: qty 4

## 2022-10-16 MED ORDER — METOPROLOL SUCCINATE ER 50 MG PO TB24
50.0000 mg | ORAL_TABLET | Freq: Every day | ORAL | Status: DC
  Administered 2022-10-16: 50 mg via ORAL
  Filled 2022-10-16: qty 1

## 2022-10-16 MED ORDER — DIPHENHYDRAMINE HCL 25 MG PO CAPS
25.0000 mg | ORAL_CAPSULE | Freq: Four times a day (QID) | ORAL | Status: DC | PRN
  Administered 2022-10-16 – 2022-10-17 (×2): 25 mg via ORAL
  Filled 2022-10-16 (×2): qty 1

## 2022-10-16 MED ORDER — POTASSIUM CHLORIDE CRYS ER 20 MEQ PO TBCR
20.0000 meq | EXTENDED_RELEASE_TABLET | ORAL | Status: AC
  Administered 2022-10-16 (×3): 20 meq via ORAL
  Filled 2022-10-16 (×3): qty 1

## 2022-10-16 NOTE — Progress Notes (Signed)
NAME:  George Wallace, MRN:  [id number], DOB:  April 14, 1961, LOS: 9 ADMISSION DATE:  10/07/2022 CONSULTATION DATE:  12/11 REFERRING MD:  Dr. Kipp Brood CHIEF COMPLAINT:  resection of mediastinal mass and sternotomy  History of Present Illness:  Patient is a 61 year old male with pertinent PMH of DMT2, HTN, HLD presents to [facility] on 12/11 for resection of mediastinal mass and sternotomy.  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  Biopsies were performed at [facility] that were inconclusive.  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  On 12/11 patient arrived to [facility] for elective surgery.  Resection of mediastinal mass was performed. 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  It was taken en bloc.  Required sternotomy to anatomy at the vein with cessation of bleeding.  Postop transferred to ICU intubated.  PCCM consulted.  Pertinent Medical History:   Past Medical History:  Diagnosis Date   Diabetes ([place])    Diet controlled   Hyperlipidemia      Significant Hospital Events:  12/11 - s/p sternotomy and resection of mediastinal mass; post op intubated 12/12 extubated 12/13 BIPAP intermittent 12/14 Afib on amio  Interim History / Subjective:  Today he denies complaints. He has walked in the hall this morning.  Tolerating going to salter flow.  Objective:  Blood pressure 119/71, pulse 80, temperature 98.5 F (36.9 C), temperature source Oral, resp. rate 13, height 5\' 8"  (1.727 m), weight 123.3 kg, SpO2 99 %.    Vent Mode: BIPAP FiO2 (%):  [30 %-40 %] 30 % Set Rate:  [16 bmp] 16 bmp PEEP:  [5 cmH20] 5 cmH20   Intake/Output Summary (Last 24 hours) at 10/16/2022 0759 Last data filed at 10/16/2022 0400 Gross per 24 hour  Intake 440 ml  Output 2375 ml  Net -1935 ml    Filed Weights   10/13/22 0500 10/15/22 0500 10/16/22 0400   Weight: 124.6 kg 125.5 kg 123.3 kg    Physical Examination: General: Middle aged man sitting up in the chair no acute distress HEENT: [place]/AT, eyes anicteric Respiratory: Breathing, newly on salter cannula.  Reduced basilar breath sounds.  Clear anteriorly.  No conversational dyspnea. Cardio: S1-S2, regular rate and rhythm Abdomen: Obese, soft, nontender  extremities: Mild edema, no cyanosis Neuro: Awake and alert, answering questions appropriately.  Moving all extremities.  Walking with a steady without noticeable deficits.  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  Seems less likely that he also has a right phrenic nerve issue, but remains possible. 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  Needs to be discharged on BiPAP. CM consult placed. - Planning for outpatient hemidiaphragm plication on the left due to phrenic nerve injury from mass requiring resection. -Continue Brovana and Pulmicort.  Can discharge on PTA Advair.  Afib post-operatively; back in sinus -Continue metoprolol - Continue amiodarone to maintain sinus  HTN -Continue lisinopril - Continue higher dose metoprolol succinate  DM2 -Continue metformin - Sliding scale  insulin as needed - Goal blood glucose 140-180 - If remains high, may need scheduled mealtime insulin  BPH -Continue tamsulosin  obesity -Long-term weight loss is recommended  Hypokalemia - Repleted - Monitor  No family at bedside during rounds.  Discussed in multidisciplinary  rounds with cardiothoracic surgery.  Steffanie Dunn, DO 10/16/22 5:32 PM [facility]  For contact information, see Amion. If no response to pager, please call PCCM consult pager. After hours, 7PM- 7AM, please call Elink.

## 2022-10-16 NOTE — Progress Notes (Signed)
Inpatient Rehabilitation Admissions Coordinator   Patient is 750 feet supervision level with therapy today. Not in need of Cir admit at this time. RN CM , Steward Drone , Is aware.  Ottie Glazier, RN, MSN Rehab Admissions Coordinator [phone] 10/16/2022 3:57 PM

## 2022-10-16 NOTE — Progress Notes (Signed)
      [address]       [place] [address]             [phone]                 9 Days Post-Op Procedure(s) (LRB): XI ROBOTIC ASSISTED BILATERAL RESECTION OF MEDIASTINAL MASS  AND STERNOTOMY (Bilateral) INTERCOSTAL NERVE BLOCK   Events: No events Doing better today Ambulating in the halls _______________________________________________________________ Vitals: BP 136/63   Pulse 86   Temp 98.3 F (36.8 C) (Oral)   Resp (!) 22   Ht 5\' 8"  (1.727 m)   Wt 123.3 kg   SpO2 98%   BMI 41.33 kg/m  Filed Weights   10/13/22 0500 10/15/22 0500 10/16/22 0400  Weight: 124.6 kg 125.5 kg 123.3 kg     - Neuro: alert NAd  - Cardiovascular: sinus  Drips: none.      - Pulm:  Vent Mode: BIPAP FiO2 (%):  [30 %] 30 % Set Rate:  [16 bmp] 16 bmp PEEP:  [5 cmH20] 5 cmH20  ABG    Component Value Date/Time   PHART 7.277 (L) 10/07/2022 1549   PCO2ART 44.1 10/07/2022 1549   PO2ART 170 (H) 10/07/2022 1549   HCO3 20.9 10/07/2022 1549   TCO2 22 10/07/2022 1549   TCO2 23 10/07/2022 1549   ACIDBASEDEF 6.0 (H) 10/07/2022 1549   O2SAT 99 10/07/2022 1549    - Abd: ND - Extremity: warm  .Intake/Output      12/19 0701 12/20 0700 12/20 0701 12/21 0700   P.O. 360 480   I.V. (mL/kg) 80 (0.6)    Total Intake(mL/kg) 440 (3.6) 480 (3.9)   Urine (mL/kg/hr) 2375 (0.8) 450 (0.5)   Stool 0    Total Output 2375 450   Net -1935 +30        Stool Occurrence 1 x       _______________________________________________________________ Labs:    Latest Ref Rng & Units 10/16/2022    5:45 AM 10/15/2022    6:08 AM 10/14/2022    1:33 AM  CBC  WBC 4.0 - 10.5 K/uL 13.2  12.9  10.2   Hemoglobin 13.0 - 17.0 g/dL 10/16/2022  40.1  9.5   Hematocrit 39.0 - 52.0 % 34.0  34.1  30.9   Platelets 150 - 400 K/uL 437  389  332       Latest Ref Rng & Units 10/16/2022    5:45 AM 10/15/2022    6:08 AM 10/14/2022    1:33 AM  CMP  Glucose 70 - 99 mg/dL 10/16/2022  253  664   BUN 6 - 20 mg/dL 18   16  13    Creatinine 0.61 - 1.24 mg/dL 403   4.74   Sodium 135 - 145 mmol/L 135  134  137   Potassium 3.5 - 5.1 mmol/L 3.3  3.7  3.6   Chloride 98 - 111 mmol/L 94  95  88   CO2 22 - 32 mmol/L 29  28  42   Calcium 8.9 - 10.3 mg/dL 9.1  9.2  8.6     CXR: stable  _______________________________________________________________  Assessment and Plan: POD 9 s/p mediastinal mass resection  Neuro: pain controlled CV: stable Pulm: continue pulm hygiene.  Will hold off on plication for now.  Continue PT Renal: stable GI: on diet Heme: stable ID: afebrile Endo: SSI Dispo: continue ICU care   2.59 10/16/2022 1:52 PM

## 2022-10-16 NOTE — Progress Notes (Signed)
Placed patient on bipap for the night  

## 2022-10-16 NOTE — Progress Notes (Signed)
Physical Therapy Treatment Patient Details Name: George Wallace MRN: [id number] DOB: 1961/05/02 Today's Date: 10/16/2022   History of Present Illness Pt is a 61 y.o. 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 Pt requiring intermittent NIV post-op. plan for possible return to OR Wed, 12/20. 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    PT Comments    Pt making excellent progress towards his physical therapy goals and is very motivated. Pt ambulating 750 ft with an Scientist, research (medical) at a supervision level. SpO2 100% on 15L O2 via HFNC, HR 82-107 bpm. Education provided regarding activity pacing and energy conservation techniques.     Recommendations for follow up therapy are one component of a multi-disciplinary discharge planning process, led by the attending physician.  Recommendations may be updated based on patient status, additional functional criteria and insurance authorization.  Follow Up Recommendations  Other (comment) (OP Pulmonary Rehab)     Assistance Recommended at Discharge PRN  Patient can return home with the following Assistance with cooking/housework;Assist for transportation;Help with stairs or ramp for entrance   Equipment Recommendations  Rollator (4 wheels) (may not need by d/c)    Recommendations for Other Services       Precautions / Restrictions Precautions Precautions: Fall;Sternal Restrictions Weight Bearing Restrictions: Yes (sternal precautions)     Mobility  Bed Mobility               General bed mobility comments: OOB upon arrival    Transfers Overall transfer level: Needs assistance Equipment used: None Transfers: Sit to/from Stand Sit to Stand: Min guard           General transfer comment: Good awareness of sternal precautions     Ambulation/Gait Ambulation/Gait assistance: Supervision Gait Distance (Feet): 750 Feet Assistive device: Fara Boros Gait Pattern/deviations: Step-through pattern, Decreased stride length, Drifts right/left Gait velocity: decreased     General Gait Details: Mild drift, slow and steady pace   Optometrist    Modified Rankin (Stroke Patients Only)       Balance Overall balance assessment: Needs assistance Sitting-balance support: Feet supported, No upper extremity supported Sitting balance-Leahy Scale: Good     Standing balance support: During functional activity Standing balance-Leahy Scale: Good                              Cognition Arousal/Alertness: Awake/alert Behavior During Therapy: WFL for tasks assessed/performed Overall Cognitive Status: Within Functional Limits for tasks assessed                                          Exercises General Exercises - Lower Extremity Long Arc Quad: Both, 10 reps, Seated Hip Flexion/Marching: Both, 10 reps, Seated Heel Raises: Both, 10 reps, Seated    General Comments        Pertinent Vitals/Pain Pain Assessment Pain Assessment: No/denies pain    Home Living                          Prior Function  PT Goals (current goals can now be found in the care plan section) Acute Rehab PT Goals Patient Stated Goal: to improve breathing Potential to Achieve Goals: Good Progress towards PT goals: Progressing toward goals    Frequency    Min 3X/week      PT Plan Discharge plan needs to be updated    Co-evaluation              AM-PAC PT "6 Clicks" Mobility   Outcome Measure  Help needed turning from your back to your side while in a flat bed without using bedrails?: None Help needed moving from lying on your back to sitting on the side of a flat bed without using bedrails?: None Help needed moving to and from a bed to a  chair (including a wheelchair)?: A Little Help needed standing up from a chair using your arms (e.g., wheelchair or bedside chair)?: A Little Help needed to walk in hospital room?: A Little Help needed climbing 3-5 steps with a railing? : A Little 6 Click Score: 20    End of Session Equipment Utilized During Treatment: Oxygen Activity Tolerance: Patient tolerated treatment well Patient left: in chair;with call bell/phone within reach;with family/visitor present Nurse Communication: Mobility status PT Visit Diagnosis: Other abnormalities of gait and mobility (R26.89);Muscle weakness (generalized) (M62.81);Unsteadiness on feet (R26.81)     Time: 0935-1010 PT Time Calculation (min) (ACUTE ONLY): 35 min  Charges:  $Therapeutic Activity: 23-37 mins                     Wyona Almas, PT, DPT Acute Rehabilitation Services Office [phone]    Deno Etienne 10/16/2022, 10:37 AM

## 2022-10-16 NOTE — Progress Notes (Signed)
Patient taken off heated high flow and placed on 15L salter. Sats are 100% at this time. RN made aware.

## 2022-10-16 NOTE — Progress Notes (Signed)
Pt requested to come off Bipap and be placed back on HHFNC. Pt placed on 20L/30%. Pt tolerating well.

## 2022-10-16 NOTE — TOC Progression Note (Signed)
Transition of Care [facility]) - Progression Note    Patient Details  Name: George Wallace MRN: [id number] Date of Birth: 11/08/1960  Transition of Care [facility]) CM/SW Contact  Graves-Bigelow, Lamar Laundry, RN Phone Number: 10/16/2022, 4:43 PM  Clinical Narrative:  Case Manager received a consult for BIPAP NIV for home. Case Manager spoke with patient and spouse and they want to use [employer]. Spouse uses [employer] for DME services. Referral submitted to [employer] and [employer] will check with insurance for authorization. Patient is aware that he is not meeting the criteria for CIR- ambulating 750 ft. Patient will need orders for a rolling walker with a seat- ordered via [employer]. Case Manager to follow the patient to see if he will benefit from [facility] PT or RN- spouse has asked for an Aide in the home for a bath. Case Manager will continue to follow for additional transition of care needs as the patient progresses.   Planned Disposition: Home with Health Care Svc Barriers to Discharge: Continued Medical Work up  Expected Discharge Plan and Services In-house Referral: NA Discharge Planning Services: CM Consult Post Acute Care Choice: Home Health, Durable Medical Equipment Living arrangements for the past 2 months: Single Family Home                 DME Arranged: Bipap DME Agency: [facility] Date DME Agency Contacted: 10/16/22 Time DME Agency Contacted: [phone] Representative spoke with at DME Agency: [employer]   Social Determinants of Health (SDOH) Interventions SDOH Screenings   Food Insecurity: No Food Insecurity (10/07/2022)  Housing: Low Risk  (10/07/2022)  Transportation Needs: No Transportation Needs (10/07/2022)  Utilities: Not At Risk (10/07/2022)  Tobacco Use: Low Risk  (10/08/2022)    Readmission Risk Interventions     No data to display

## 2022-10-17 DIAGNOSIS — I152 Hypertension secondary to endocrine disorders: Secondary | ICD-10-CM

## 2022-10-17 DIAGNOSIS — E1159 Type 2 diabetes mellitus with other circulatory complications: Secondary | ICD-10-CM

## 2022-10-17 LAB — GLUCOSE, CAPILLARY
Glucose-Capillary: 132 mg/dL — ABNORMAL HIGH (ref 70–99)
Glucose-Capillary: 114 mg/dL — ABNORMAL HIGH (ref 70–99)
Glucose-Capillary: 143 mg/dL — ABNORMAL HIGH (ref 70–99)
Glucose-Capillary: 115 mg/dL — ABNORMAL HIGH (ref 70–99)

## 2022-10-17 LAB — BASIC METABOLIC PANEL
Anion gap: 11 (ref 5–15)
BUN: 20 mg/dL (ref 6–20)
CO2: 30 mmol/L (ref 22–32)
Calcium: 9.2 mg/dL (ref 8.9–10.3)
Chloride: 94 mmol/L — ABNORMAL LOW (ref 98–111)
Creatinine, Ser: 1.04 mg/dL (ref 0.61–1.24)
GFR, Estimated: >60 mL/min (ref >60)
Glucose, Bld: 133 mg/dL — ABNORMAL HIGH (ref 70–99)
Potassium: 3.7 mmol/L (ref 3.5–5.1)
Sodium: 135 mmol/L (ref 135–145)

## 2022-10-17 LAB — MAGNESIUM: Magnesium: 2.3 mg/dL (ref 1.7–2.4)

## 2022-10-17 MED ORDER — METOPROLOL SUCCINATE ER 50 MG PO TB24
75.0000 mg | ORAL_TABLET | Freq: Every day | ORAL | Status: DC
  Administered 2022-10-17 – 2022-10-22 (×6): 75 mg via ORAL
  Filled 2022-10-17 (×6): qty 1

## 2022-10-17 MED ORDER — FUROSEMIDE 10 MG/ML IJ SOLN
40.0000 mg | Freq: Once | INTRAMUSCULAR | Status: AC
  Administered 2022-10-17: 40 mg via INTRAVENOUS
  Filled 2022-10-17: qty 4

## 2022-10-17 MED ORDER — MENTHOL 3 MG MT LOZG
1.0000 | LOZENGE | OROMUCOSAL | Status: DC | PRN
  Administered 2022-10-17: 3 mg via ORAL
  Filled 2022-10-17: qty 9

## 2022-10-17 MED ORDER — POTASSIUM CHLORIDE CRYS ER 20 MEQ PO TBCR
20.0000 meq | EXTENDED_RELEASE_TABLET | ORAL | Status: AC
  Administered 2022-10-17 (×3): 20 meq via ORAL
  Filled 2022-10-17 (×3): qty 1

## 2022-10-17 MED ORDER — PROSOURCE PLUS PO LIQD
30.0000 mL | Freq: Two times a day (BID) | ORAL | Status: DC
  Administered 2022-10-17: 30 mL via ORAL
  Filled 2022-10-17 (×6): qty 30

## 2022-10-17 MED ORDER — ENSURE ENLIVE PO LIQD
237.0000 mL | Freq: Two times a day (BID) | ORAL | Status: DC
  Administered 2022-10-17 – 2022-10-20 (×3): 237 mL via ORAL

## 2022-10-17 MED ORDER — POTASSIUM CHLORIDE CRYS ER 20 MEQ PO TBCR
40.0000 meq | EXTENDED_RELEASE_TABLET | Freq: Once | ORAL | Status: AC
  Administered 2022-10-17: 40 meq via ORAL
  Filled 2022-10-17: qty 2

## 2022-10-17 NOTE — TOC Initial Note (Signed)
Transition of Care [facility]) - Initial/Assessment Note    Patient Details  Name: George Wallace MRN: [id number] Date of Birth: 11/03/1960  Transition of Care [facility]) CM/SW Contact:    Erenest Rasher, RN Phone Number: [phone] 10/17/2022, 4:57 PM  Clinical Narrative:                 HF TOC CM spoke to pt. Spoke to pt's wife. States she will need hospital bed, Rollator, and bedside commode at home. [place] for [employer] Will review referral. Contacted Respiratory Therapy for follow up on PFT. PFT was not completed. Changed order to stat, and they will complete in am.   Planned Disposition: Home with Health Care Svc Barriers to Discharge: Continued Medical Work up   Patient Goals and CMS Choice Patient states their goals for this hospitalization and ongoing recovery are:: to return home.   Expected Discharge Plan and Services Planned Disposition: Home with Health Care Svc In-house Referral: NA Discharge Planning Services: CM Consult Post Acute Care Choice: Home Health, Durable Medical Equipment Living arrangements for the past 2 months: Single Family Home                 DME Arranged: Bipap DME Agency: [facility] Date DME Agency Contacted: 10/16/22 Time DME Agency Contacted: [phone] Representative spoke with at DME Agency: [place] Arranged: PT, RN          Prior Living Arrangements/Services Living arrangements for the past 2 months: [place] Lives with:: Spouse Patient language and need for interpreter reviewed:: Yes Do you feel safe going back to the place where you live?: Yes      Need for Family Participation in Patient Care: Yes (Comment) Care giver support system in place?: Yes (comment)   Criminal Activity/Legal Involvement Pertinent to Current Situation/Hospitalization: No - Comment as needed  Activities of Daily Living Home Assistive Devices/Equipment: None ADL Screening (condition at time of admission) Patient's cognitive ability  adequate to safely complete daily activities?: Yes Is the patient deaf or have difficulty hearing?: No Does the patient have difficulty seeing, even when wearing glasses/contacts?: No Does the patient have difficulty concentrating, remembering, or making decisions?: No Patient able to express need for assistance with ADLs?: Yes Does the patient have difficulty dressing or bathing?: No Independently performs ADLs?: Yes (appropriate for developmental age) Does the patient have difficulty walking or climbing stairs?: No Weakness of Legs: None Weakness of Arms/Hands: None  Permission Sought/Granted Permission sought to share information with : Case Manager, Family Supports, PCP Permission granted to share information with : Yes, Verbal Permission Granted  Share Information with NAME: Mackie Pai  Permission granted to share info w AGENCY: [employer]  Permission granted to share info w Relationship: wife  Permission granted to share info w Contact Information: [phone]  Emotional Assessment Appearance:: Appears stated age Attitude/Demeanor/Rapport: Engaged Affect (typically observed): Appropriate Orientation: : Oriented to Self, Oriented to Place, Oriented to  Time, Oriented to Situation Alcohol / Substance Use: Not Applicable Psych Involvement: No (comment)  Admission diagnosis:  Mediastinal mass [J98.59] Patient Active Problem List   Diagnosis Date Noted   Acute respiratory failure ([place]) 10/07/2022   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   Type 2 diabetes mellitus without complication, without long-term current use of insulin ([place]) 10/07/2022   Hardening of the aorta (main artery of the heart) ([place]) 06/07/2022   Excessive sleepiness 06/07/2022   Hyperlipidemia, unspecified 06/07/2022   Inattention 06/07/2022   Obstructive sleep apnea (adult) (pediatric) 06/07/2022  Personal history of colonic polyps 06/07/2022   Type 2 diabetes mellitus with other specified complication (HCC) 06/07/2022    Vitamin D deficiency 06/07/2022   Mediastinal mass 05/31/2022   Intermittent asthma without complication 03/04/2015   Cardiomegaly 12/06/2014   Wheeze 12/06/2014   Morbid obesity (HCC) 12/06/2014   PCP:  Gweneth Dimitri, MD Pharmacy:   [phone] Otto, [place] - [address] [place] [address] Phone: [phone] Fax: [phone]  [facility] - [place], [place] - [address] [place] [address] Phone: [phone] Fax: [phone]     Social Determinants of Health (SDOH) Social History: SDOH Screenings   Food Insecurity: No Food Insecurity (10/07/2022)  Housing: Low Risk  (10/07/2022)  Transportation Needs: No Transportation Needs (10/07/2022)  Utilities: Not At Risk (10/07/2022)  Tobacco Use: Low Risk  (10/08/2022)   SDOH Interventions:     Readmission Risk Interventions     No data to display

## 2022-10-17 NOTE — Progress Notes (Signed)
NAME:  George Wallace, MRN:  [id number], DOB:  08-03-1961, LOS: 10 ADMISSION DATE:  10/07/2022 CONSULTATION DATE:  12/11 REFERRING MD:  Dr. Cliffton Asters CHIEF COMPLAINT:  resection of mediastinal mass and sternotomy  History of Present Illness:  Patient is a 61 year old male with pertinent PMH of DMT2, HTN, HLD presents to [facility] on 12/11 for resection of mediastinal mass and sternotomy.  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  Biopsies were performed at [facility] that were inconclusive.  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  On 12/11 patient arrived to [facility] for elective surgery.  Resection of mediastinal mass was performed. 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  It was taken en bloc.  Required sternotomy to anatomy at the vein with cessation of bleeding.  Postop transferred to ICU intubated.  PCCM consulted.  Pertinent Medical History:   Past Medical History:  Diagnosis Date   Diabetes (HCC)    Diet controlled   Hyperlipidemia      Significant Hospital Events:  12/11 - s/p sternotomy and resection of mediastinal mass; post op intubated 12/12 extubated 12/13 BIPAP intermittent 12/14 Afib on amio  Interim History / Subjective:  Down to 5 L nasal cannula.  Has been walking in the hall.  Objective:  Blood pressure 119/61, pulse 83, temperature 98.4 F (36.9 C), temperature source Oral, resp. rate (!) 24, height 5\' 8"  (1.727 m), weight 123.4 kg, SpO2 100 %.        Intake/Output Summary (Last 24 hours) at 10/17/2022 0917 Last data filed at 10/17/2022 0600 Gross per 24 hour  Intake 480 ml  Output 1050 ml  Net -570 ml    Filed Weights   10/15/22 0500 10/16/22 0400 10/17/22 0400  Weight: 125.5 kg 123.3 kg 123.4 kg    Physical Examination: General: Middle-aged man sitting up in a chair no acute distress HEENT: [place]/  AT, eyes anicteric Respiratory: Breathing comfortably getting nebs, diminished breath sounds throughout.  No conversational dyspnea.  No adventitial lung sounds. Cardio: S1-S2, RRR Abdomen: Obese, soft, nontender Extremities: No cyanosis or clubbing.  Improving edema. Neuro: Awake, alert, answering questions appropriately.  Moving all extremities.  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  Seems less likely that he also has a right phrenic nerve issue, but remains possible. 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  At discharge can resume PTA Advair.  Afib post-operatively; back in sinus - Continue metoprolol; creased to 75 mg daily - Amiodarone to maintain sinus rhythm.  HTN -Continue lisinopril - Increase metoprolol succinate to 75mg  daily  DM2, controlled hyperglycemia - Continue Forman - SSI as needed-minimal requirements  BPH -Continue tamsulosin  obesity - Monitor weight loss physical  Hypokalemia, resolved - Repleted - give additional K+ to prevent dropping. -Recheck BMP tomorrow  Stable to transfer out of the intensive care unit today.  No family at bedside  this morning. Discussed in multidisciplinary rounds with cardiothoracic surgery.  Julian Hy, DO 10/17/22 9:17 AM [facility]  For contact information, see Amion. If no response to pager, please call PCCM consult pager. After hours,  7PM- 7AM, please call Elink.

## 2022-10-17 NOTE — Progress Notes (Signed)
Pt placed on cpap for the night with 4L O2 bled in and seems to be tolerating it well at this time.  

## 2022-10-17 NOTE — Plan of Care (Signed)

## 2022-10-17 NOTE — Progress Notes (Signed)
Nutrition Follow-up  DOCUMENTATION CODES:   Obesity unspecified  INTERVENTION:   Adjust diet to Carb Modified to maximize diet choices Encourage good PO intake as pt currently averaging <50% intake of his meals  Continue Ensure Plus/Ensure Plus High Protein po BID, each supplement provides 350 kcal and 16-20 grams of protein. Was receiving TID and pt reports this was too often, getting overwhelmed with supplements  Trial Prosource Plus BID for additional protein and kcal. Each packet is 100kcal and 15g of protein  Reduce Magic cup to BID with meals, each supplement provides 290 kcal and 9 grams of protein   NUTRITION DIAGNOSIS:   Inadequate oral intake related to acute illness as evidenced by per patient/family report (respiratory status negatively impacting po intake and diet advancement). - remains applicable  GOAL:   Patient will meet greater than or equal to 90% of their needs - Progressing, diet and supplements in place  MONITOR:   Diet advancement, Labs, Weight trends, TF tolerance, Skin  REASON FOR ASSESSMENT:   Rounds    ASSESSMENT:   61 yo admitted 12/11 for sternotomy with resection of mediastinal mass. PMH includes DM, HLD  12/11 Admitted, OR for sternotomy and resection of mediastinal mass, returned to ICU intubated 12/12 Extubated, Diet advanced to CL 12/15 Diet advanced Full Liquid 12/16 Diet advanced Heart Healthy/Carb Modified  Pt resting in bedside chair at the time of assessment. Pt reports that appetite has been fair. Largely depends on what food he is receiving. 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 He will have one meal at home in the evening when he returns from work.  Discussed with pt the importance of adequate PO intake in his healing and recovery. Discussed that his needs are higher than at baseline and his body is requiring more protein to heal from surgery. Pt eating  some supplements (ensure, magic cup) but does report that he is receiving too many and is not able to consume them all. Made adjustments and pt to try Prosource Plus as it is only 1 oz but does provide 15g of protein.   Pt to try and eat a little more of his meals. Will liberalize to carb modified to allow for additional options. Pt also states he feels like he has a little congestion/sinus drainage which is bothering his throat today. States he would like to have some hot tea with honey. Advised that these are available in the kitchen and to ask for them when his lunch order was taken. Pt inquired about his blood sugar and assured him that having a small amount of honey with his tea 1-2x a day would be fine. Discussed that glucose is typically higher in the hospital due to poor sleep, medications, and stress.  Admit Weight: 129.3 kg Current Weight: 123.4 kg   Intake/Output Summary (Last 24 hours) at 10/17/2022 0908 Last data filed at 10/17/2022 0600 Gross per 24 hour  Intake 480 ml  Output 1050 ml  Net -570 ml  Net IO Since Admission: -9,921.97 mL [10/17/22 0908]  Average Meal Intake: 12/16-12/20: 43% intake x 9 recorded meals  Nutritionally Relevant Medications: Scheduled Meds:  bisacodyl  10 mg Oral Daily   Ensure Enlive  237 mL Oral TID BM   furosemide  40 mg Intravenous Once   insulin aspart  0-15 Units Subcutaneous TID WC   insulin aspart  0-5 Units Subcutaneous QHS   metFORMIN  500 mg Oral Q breakfast   polyethylene glycol  17 g Oral Daily   potassium chloride  20 mEq Oral Q4H   rosuvastatin  10 mg Oral QPM   senna-docusate  1 tablet Oral Daily   PRN Meds: diphenhydrAMINE, ondansetron  Labs Reviewed: Chloride 94 CBG ranges from 114-193 mg/dL over the last 24 hours  NUTRITION - FOCUSED PHYSICAL EXAM: Flowsheet Row Most Recent Value  Orbital Region No depletion  Upper Arm Region Unable to assess  Thoracic and Lumbar Region No depletion  Buccal Region No depletion   Temple Region No depletion  Clavicle Bone Region No depletion  Clavicle and Acromion Bone Region No depletion  Scapular Bone Region No depletion  Dorsal Hand No depletion  Patellar Region Unable to assess  Anterior Thigh Region Unable to assess  Posterior Calf Region Unable to assess  Edema (RD Assessment) Moderate   Diet Order:   Diet Order             Diet heart healthy/carb modified Room service appropriate? Yes; Fluid consistency: Thin  Diet effective now                   EDUCATION NEEDS:   Education needs have been addressed  Skin:  Skin Assessment: Reviewed RN Assessment Surgical incisions to the sternum and chest  Last BM:  12/20 - type 5  Height:   Ht Readings from Last 1 Encounters:  10/13/22 5\' 8"  (1.727 m)    Weight:   Wt Readings from Last 1 Encounters:  10/17/22 123.4 kg    BMI:  Body mass index is 41.36 kg/m.  Estimated Nutritional Needs:  Kcal:  2100-2300 kcals Protein:  115-130 g Fluid:  2L    Ranell Patrick, RD, LDN Clinical Dietitian RD pager # available in AMION  After hours/weekend pager # available in [facility]

## 2022-10-17 NOTE — Progress Notes (Signed)
      [address]       [place] [address]             [phone]                 10 Days Post-Op Procedure(s) (LRB): XI ROBOTIC ASSISTED BILATERAL RESECTION OF MEDIASTINAL MASS  AND STERNOTOMY (Bilateral) INTERCOSTAL NERVE BLOCK   Events: No events Doing better today Ambulating in the halls _______________________________________________________________ Vitals: BP 130/73   Pulse 78   Temp 98.4 F (36.9 C) (Oral)   Resp 19   Ht 5\' 8"  (1.727 m)   Wt 123.4 kg   SpO2 100%   BMI 41.36 kg/m  Filed Weights   10/15/22 0500 10/16/22 0400 10/17/22 0400  Weight: 125.5 kg 123.3 kg 123.4 kg     - Neuro: alert NAd  - Cardiovascular: sinus  Drips: none.      - Pulm:  FiO2 (%):  [30 %] 30 %  ABG    Component Value Date/Time   PHART 7.277 (L) 10/07/2022 1549   PCO2ART 44.1 10/07/2022 1549   PO2ART 170 (H) 10/07/2022 1549   HCO3 20.9 10/07/2022 1549   TCO2 22 10/07/2022 1549   TCO2 23 10/07/2022 1549   ACIDBASEDEF 6.0 (H) 10/07/2022 1549   O2SAT 99 10/07/2022 1549    - Abd: ND - Extremity: warm  .Intake/Output      12/20 0701 12/21 0700 12/21 0701 12/22 0700   P.O. 720    I.V. (mL/kg)     Total Intake(mL/kg) 720 (5.8)    Urine (mL/kg/hr) 1050 (0.4)    Stool 0    Total Output 1050    Net -330         Stool Occurrence 1 x       _______________________________________________________________ Labs:    Latest Ref Rng & Units 10/16/2022    5:45 AM 10/15/2022    6:08 AM 10/14/2022    1:33 AM  CBC  WBC 4.0 - 10.5 K/uL 13.2  12.9  10.2   Hemoglobin 13.0 - 17.0 g/dL 10/16/2022  87.8  9.5   Hematocrit 39.0 - 52.0 % 34.0  34.1  30.9   Platelets 150 - 400 K/uL 437  389  332       Latest Ref Rng & Units 10/17/2022    4:04 AM 10/16/2022    5:45 AM 10/15/2022    6:08 AM  CMP  Glucose 70 - 99 mg/dL 10/17/2022  720  947   BUN 6 - 20 mg/dL 20  18  16    Creatinine 0.61 - 1.24 mg/dL 096   2.83   Sodium 135 - 145 mmol/L 135  135  134   Potassium  3.5 - 5.1 mmol/L 3.7  3.3  3.7   Chloride 98 - 111 mmol/L 94  94  95   CO2 22 - 32 mmol/L 30  29  28    Calcium 8.9 - 10.3 mg/dL 9.2  9.1  9.2     CXR: stable  _______________________________________________________________  Assessment and Plan: POD 10 s/p mediastinal mass resection  Neuro: pain controlled CV: stable Pulm: continue pulm hygiene.  Will hold off on plication for now.  Continue PT Renal: stable GI: on diet Heme: stable ID: afebrile Endo: SSI Dispo: floor   6.62 10/17/2022 7:16 AM

## 2022-10-17 NOTE — Progress Notes (Signed)
Patient ID: George Wallace, male   DOB: 1961-03-10, 61 y.o.   MRN: [id number]  TCTS Evening Rounds:  Hemodynamically stable in sinus rhythm.   Sats 99% on 4L [place]  Ambulating.  Waiting on progressive care bed.

## 2022-10-18 ENCOUNTER — Inpatient Hospital Stay (HOSPITAL_COMMUNITY): Payer: BC Managed Care – PPO

## 2022-10-18 ENCOUNTER — Ambulatory Visit: Payer: Self-pay | Admitting: Thoracic Surgery (Cardiothoracic Vascular Surgery)

## 2022-10-18 LAB — BASIC METABOLIC PANEL
Anion gap: 8 (ref 5–15)
BUN: 28 mg/dL — ABNORMAL HIGH (ref 6–20)
CO2: 29 mmol/L (ref 22–32)
Calcium: 8.9 mg/dL (ref 8.9–10.3)
Chloride: 96 mmol/L — ABNORMAL LOW (ref 98–111)
Creatinine, Ser: 1.21 mg/dL (ref 0.61–1.24)
GFR, Estimated: >60 mL/min (ref >60)
Glucose, Bld: 142 mg/dL — ABNORMAL HIGH (ref 70–99)
Potassium: 4.6 mmol/L (ref 3.5–5.1)
Sodium: 133 mmol/L — ABNORMAL LOW (ref 135–145)

## 2022-10-18 LAB — BLOOD GAS, ARTERIAL
Acid-Base Excess: 4.7 mmol/L — ABNORMAL HIGH (ref 0.0–2.0)
Bicarbonate: 29.2 mmol/L — ABNORMAL HIGH (ref 20.0–28.0)
O2 Saturation: 95.6 %
Patient temperature: 36.5
pCO2 arterial: 41 mmHg (ref 32–48)
pH, Arterial: 7.46 — ABNORMAL HIGH (ref 7.35–7.45)
pO2, Arterial: 68 mmHg — ABNORMAL LOW (ref 83–108)

## 2022-10-18 LAB — PULMONARY FUNCTION TEST
FEF 25-75 Pre: 1.57 L/sec
FEF2575-%Pred-Pre: 55 %
FEV1-%Pred-Pre: 26 %
FEV1-Pre: 0.9 L
FEV1FVC-%Pred-Pre: 129 %
FEV6-%Pred-Pre: 21 %
FEV6-Pre: 0.91 L
FEV6FVC-%Pred-Pre: 104 %
FVC-%Pred-Pre: 20 %
FVC-Pre: 0.91 L
Pre FEV1/FVC ratio: 98 %
Pre FEV6/FVC Ratio: 100 %
RV % pred: 55 %
RV: 1.18 L
TLC % pred: 31 %
TLC: 2.05 L

## 2022-10-18 LAB — GLUCOSE, CAPILLARY
Glucose-Capillary: 108 mg/dL — ABNORMAL HIGH (ref 70–99)
Glucose-Capillary: 119 mg/dL — ABNORMAL HIGH (ref 70–99)
Glucose-Capillary: 121 mg/dL — ABNORMAL HIGH (ref 70–99)
Glucose-Capillary: 119 mg/dL — ABNORMAL HIGH (ref 70–99)

## 2022-10-18 NOTE — Progress Notes (Signed)
Mobility Specialist Progress Note   10/18/22 1055  Mobility  Activity Ambulated independently in hallway  Level of Assistance Independent  Assistive Device None  Distance Ambulated (ft) 290 ft  Range of Motion/Exercises Active;All extremities  RUE Weight Bearing NWB  LUE Weight Bearing NWB  Activity Response Tolerated well   Pre Mobility:  HR 78, SpO2 96% RA During Mobility: HR 80, SpO2 98% RA Post Mobility: HR 78, SpO2 98% RA  Patient received in recliner and agreeable to participate in mobility. Ambulated independently with steady gait. Returned to room without complaint or incident. Was left in recliner with all needs met, call bell in reach.   George Wallace, BS EXP Mobility Specialist Please contact via SecureChat or Rehab office at [phone]

## 2022-10-18 NOTE — Progress Notes (Signed)
SATURATION QUALIFICATIONS: (This note is used to comply with regulatory documentation for home oxygen)  Patient Saturations on Room Air at Rest = 96%  Patient Saturations on Room Air while Ambulating = 98%  Patient Saturations on 0 Liters of oxygen while Ambulating = n/a%  Please briefly explain why patient needs home oxygen:

## 2022-10-18 NOTE — Progress Notes (Addendum)
      [address]       [place] [address]             [phone]        11 Days Post-Op Procedure(s) (LRB): XI ROBOTIC ASSISTED BILATERAL RESECTION OF MEDIASTINAL MASS  AND STERNOTOMY (Bilateral) INTERCOSTAL NERVE BLOCK  Subjective: Patient sitting in chair. He continues to feel better and has no specific complaint this am  Objective: Vital signs in last 24 hours: Temp:  [97.5 F (36.4 C)-98.2 F (36.8 C)] 97.7 F (36.5 C) (12/22 0700) Pulse Rate:  [66-84] 68 (12/22 0700) Cardiac Rhythm: Normal sinus rhythm (12/21 1908) Resp:  [11-27] 11 (12/22 0700) BP: (101-139)/(35-82) 105/65 (12/22 0700) SpO2:  [95 %-100 %] 100 % (12/22 0700) FiO2 (%):  [36 %] 36 % (12/21 2205) Weight:  [638 kg] 124 kg (12/22 0417)   Current Weight  10/18/22 124 kg      Intake/Output from previous day: 12/21 0701 - 12/22 0700 In: -  Out: 425 [Urine:425]   Physical Exam:  Cardiovascular: RRR, no murmurs, gallops, or rubs. Pulmonary: Clear to auscultation bilaterally; no rales, wheezes, or rhonchi. Abdomen: Soft, non tender, bowel sounds present. Extremities: Mild bilateral lower extremity edema. Wounds: Clean and dry.  No erythema or signs of infection.  Lab Results: CBC: Recent Labs    10/16/22 0545  WBC 13.2*  HGB 11.5*  HCT 34.0*  PLT 437*   BMET:  Recent Labs    10/17/22 0404 10/18/22 0015  NA 135 133*  K 3.7 4.6  CL 94* 96*  CO2 30 29  GLUCOSE 133* 142*  BUN 20 28*  CREATININE 1.04 1.21  CALCIUM 9.2 8.9    PT/INR:  Lab Results  Component Value Date   INR 1.2 10/07/2022   INR 1.0 10/03/2022   ABG:  INR: Will add last result for INR, ABG once components are confirmed Will add last 4 CBG results once components are confirmed  Assessment/Plan:  1. CV - Previous a fib. SR this am. On Amiodarone 200 mg bid, Lisinopril 2.5 mg daily, and Toprol XL 75 mg daily. 2.  Pulmonary - Acute respiratory failure with hypoxia, postoperative hypoxemia- He  was on CPAP over night (likely has OSA). On 4 L [place] during the day. Continue Brovana 3.  Expected post op acute blood loss anemia - Last H and H stable at 11.5 and 34. 4. DM-CBGs 143/115/108. On Insulin and Metformin XR 500 Q am 5. On Lovenox for DVT prophylaxis 6. Once home bi pap arranged on 2 L of [place] or less, will consider discharge 7. Patient needs hospital bed for when discharged as has difficulty with mobility and lying flat Donielle M ZimmermanPA-C 7:52 AM  Agree with above. Continue incentive spirometry and ambulation. Dispo planning.  Klara Stjames Keane [name]

## 2022-10-18 NOTE — Progress Notes (Addendum)
Pt amb, pt was educated on sternal precautions, wt restrictions, no baths/daily wash-ups, s/s of infection, ex guidelines (progressive walking, s/s to stop exercising, NTG use and calling 911, heart healthy diet, and CRPII. Pt received OHS book and materials on exercise, diet, and CRPII.  Will refer to [facility].   Pt wants to do CRPII, pt would do great in program.  Faustino Congress 10/18/2022 2:45    [phone]

## 2022-10-18 NOTE — TOC CM/SW Note (Cosign Needed)
..      Durable Medical Equipment  (From admission, onward)           Start     Ordered   10/18/22 0941  For home use only DME 4 wheeled rolling walker with seat  Once       Question:  Patient needs a walker to treat with the following condition  Answer:  Paralysis of diaphragm   10/18/22 0942   10/18/22 0937  For home use only DME Hospital bed  Once       Comments: J98.6  Question Answer Comment  Length of Need 12 Months   Patient has (list medical condition): Left hemidiaphragm paralysis   The above medical condition requires: Patient requires the ability to reposition frequently   Head must be elevated greater than: 45 degrees   Bed type Semi-electric   Trapeze Bar Yes   Support Surface: Gel Overlay      10/18/22 0942

## 2022-10-18 NOTE — Progress Notes (Signed)
Patient in for Full PFT study.  Patient gave his best effort.  Had problems wtith patient understanding and following instructons. Tried various ways coaching the patient. Tried coaching in small steps and practicing each step before attempting maneuver on PFT machine.  Was with patient for greater than an hour.  Only able to get FVC and SVC portion  of the study done.  MD called and made aware.

## 2022-10-18 NOTE — TOC Progression Note (Addendum)
Transition of Care [facility]) - Progression Note    Patient Details  Name: George Wallace MRN: [id number] Date of Birth: 1961/09/16  Transition of [facility] CM/SW Contact  Elliot Cousin, RN Phone Number:[phone] 10/18/2022, 12:49 PM  Clinical Narrative:     HF TOC CM received notification from RT that pt did not complete PFT. Will need documentation for NIV for home. Contacted attending for ABG. Waiting referral acceptance for [facility] with [facility]. [facility] is accepting. Due holiday, delay in service, soc Wednesday, 12/27.  Will need HH RN and PT orders with [facility].   Expected Discharge Plan: Home w Home Health Services Barriers to Discharge: Continued Medical Work up  Expected Discharge Plan and Services In-house Referral: NA Discharge Planning Services: CM Consult Post Acute Care Choice: Home Health, Durable Medical Equipment Living arrangements for the past 2 months: Single Family Home                 DME Arranged: [employer] DME Agency: [employer] Date DME Agency Contacted: 10/16/22 Time DME Agency Contacted: [phone] Representative spoke with at DME Agency: [facility] HH Arranged: PT, RN           Social Determinants of Health (SDOH) Interventions SDOH Screenings   Food Insecurity: No Food Insecurity (10/07/2022)  Housing: Low Risk  (10/07/2022)  Transportation Needs: No Transportation Needs (10/07/2022)  Utilities: Not At Risk (10/07/2022)  Tobacco Use: Low Risk  (10/08/2022)    Readmission Risk Interventions     No data to display

## 2022-10-18 NOTE — Progress Notes (Addendum)
Came to see pt to assist with amb but pt is being transported out of room. Will f/u

## 2022-10-18 NOTE — Progress Notes (Signed)
Physical Therapy Treatment Patient Details Name: George Wallace MRN: [id number] DOB: 1961/10/06 Today's Date: 10/18/2022   History of Present Illness Pt is a 61 y.o. 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 Pt requiring intermittent NIV post-op. plan for possible return to OR Wed, 12/20. 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    PT Comments    Pt continues to demonstrate consistent, good progress with mobility. He was able to ambulate without UE support or assistance today, displaying some mild instability when nodding his head up and down. Pt with good recall and compliance of his sternal precautions. Educated pt and had pt practice performing diaphragmatic breathing with input through placement of his hands on his chest and abdomen. Pt did display some SOB with increased mobility, but VSS on RA when pleth was good. Will continue to follow acutely. Current recommendations remain appropriate.     Recommendations for follow up therapy are one component of a multi-disciplinary discharge planning process, led by the attending physician.  Recommendations may be updated based on patient status, additional functional criteria and insurance authorization.  Follow Up Recommendations  Other (comment) (OP Pulmonary Rehab)     Assistance Recommended at Discharge PRN  Patient can return home with the following Assistance with cooking/housework;Assist for transportation;Help with stairs or ramp for entrance   Equipment Recommendations  Rollator (4 wheels) (may not need by d/c)    Recommendations for Other Services       Precautions / Restrictions Precautions Precautions: Fall;Sternal Precaution Booklet Issued: Yes (comment) Precaution Comments: reviewed sternal  precautions Restrictions Weight Bearing Restrictions: Yes (sternal precautions) Other Position/Activity Restrictions: UE sternal percautions     Mobility  Bed Mobility               General bed mobility comments: OOB upon arrival    Transfers Overall transfer level: Needs assistance Equipment used: None Transfers: Sit to/from Stand Sit to Stand: Supervision           General transfer comment: Good awareness of sternal precautions, supervision for safety and line management    Ambulation/Gait Ambulation/Gait assistance: Supervision Gait Distance (Feet): 680 Feet Assistive device: None Gait Pattern/deviations: Step-through pattern, Decreased stride length Gait velocity: decreased Gait velocity interpretation: <1.8 ft/sec, indicate of risk for recurrent falls   General Gait Details: Pt with slow, but mostly steady gait. Pt did have 1 moment of minor LOB when cued to look superior <> inferior in which pt was able to recover on his own. Supervision for safety.   Stairs         General stair comments: encouraged pt to use rail and pace self on stairs at home   Wheelchair Mobility    Modified Rankin (Stroke Patients Only)       Balance Overall balance assessment: Needs assistance Sitting-balance support: Feet supported, No upper extremity supported Sitting balance-Leahy Scale: Good     Standing balance support: During functional activity, No upper extremity supported Standing balance-Leahy Scale: Good Standing balance comment: reaches off COG (but maintains sternal precautions) to manage lines, x1 minor stagger when changing head positions                            Cognition Arousal/Alertness: Awake/alert Behavior During Therapy: WFL for tasks assessed/performed Overall Cognitive Status:  Within Functional Limits for tasks assessed                                 General Comments: Pt asking for more info on his hospital  course so he can better understand what all occured.        Exercises Other Exercises Other Exercises: educated pt on diaphragmatic breathing with input provided through 1 hand on abdomen and 1 hand on chest    General Comments General comments (skin integrity, edema, etc.): poor pleth at times, but VSS on RA with good pleth; encouraged some energy conservation techniques      Pertinent Vitals/Pain Pain Assessment Pain Assessment: Faces Faces Pain Scale: Hurts a little bit Pain Location: bil feet Pain Descriptors / Indicators: Tingling Pain Intervention(s): Limited activity within patient's tolerance, Monitored during session    Home Living                          Prior Function            PT Goals (current goals can now be found in the care plan section) Acute Rehab PT Goals Patient Stated Goal: to continue to improve PT Goal Formulation: With patient/family Time For Goal Achievement: 10/22/22 Potential to Achieve Goals: Good Progress towards PT goals: Progressing toward goals    Frequency    Min 3X/week      PT Plan Current plan remains appropriate    Co-evaluation              AM-PAC PT "6 Clicks" Mobility   Outcome Measure  Help needed turning from your back to your side while in a flat bed without using bedrails?: None Help needed moving from lying on your back to sitting on the side of a flat bed without using bedrails?: None Help needed moving to and from a bed to a chair (including a wheelchair)?: A Little Help needed standing up from a chair using your arms (e.g., wheelchair or bedside chair)?: A Little Help needed to walk in hospital room?: A Little Help needed climbing 3-5 steps with a railing? : A Little 6 Click Score: 20    End of Session   Activity Tolerance: Patient tolerated treatment well Patient left: in chair;with call bell/phone within reach;with family/visitor present   PT Visit Diagnosis: Other abnormalities of gait  and mobility (R26.89);Muscle weakness (generalized) (M62.81);Unsteadiness on feet (R26.81)     Time: 1440-1500 PT Time Calculation (min) (ACUTE ONLY): 20 min  Charges:  $Therapeutic Exercise: 8-22 mins                     Raymond Gurney, PT, DPT Acute Rehabilitation Services  Office: [phone]    Jewel Baize 10/18/2022, 5:11 PM

## 2022-10-18 NOTE — Progress Notes (Signed)
Pt placed on CPAP at previous mode. Tol well

## 2022-10-19 ENCOUNTER — Telehealth: Payer: Self-pay | Admitting: Pulmonary Disease

## 2022-10-19 LAB — GLUCOSE, CAPILLARY
Glucose-Capillary: 112 mg/dL — ABNORMAL HIGH (ref 70–99)
Glucose-Capillary: 120 mg/dL — ABNORMAL HIGH (ref 70–99)
Glucose-Capillary: 162 mg/dL — ABNORMAL HIGH (ref 70–99)
Glucose-Capillary: 127 mg/dL — ABNORMAL HIGH (ref 70–99)

## 2022-10-19 NOTE — Progress Notes (Signed)
NAME:  George Wallace, MRN:  [id number], DOB:  06/04/1961, LOS: 42 ADMISSION DATE:  10/07/2022 CONSULTATION DATE:  12/11 REFERRING MD:  Dr. Kipp Brood CHIEF COMPLAINT:  resection of mediastinal mass and sternotomy  History of Present Illness:  Patient is a 61 year old male with pertinent PMH of DMT2, HTN, HLD presents to [facility] on 12/11 for resection of mediastinal mass and sternotomy.  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  Biopsies were performed at [facility] that were inconclusive.  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  On 12/11 patient arrived to [facility] for elective surgery.  Resection of mediastinal mass was performed. 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  It was taken en bloc.  Required sternotomy to anatomy at the vein with cessation of bleeding.  Postop transferred to ICU intubated.  PCCM consulted.  Pertinent Medical History:   Past Medical History:  Diagnosis Date   Diabetes (George Wallace)    Diet controlled   Hyperlipidemia      Significant Hospital Events:  12/11 - s/p sternotomy and resection of mediastinal mass; post op intubated 12/12 extubated 12/13 BIPAP intermittent 12/14 Afib on amio  Interim History / Subjective:  Patient on room air Ambulating well No complaints Breathing comfortably. Nearing discharge.   Objective:  Blood pressure 124/67, pulse 81, temperature 98.1 F (36.7 C), temperature source Axillary, resp. rate (!) 24, height 5\' 8"  (1.727 m), weight 123.6 kg, SpO2 97 %.        Intake/Output Summary (Last 24 hours) at 10/19/2022 1220 Last data filed at 10/19/2022 0300 Gross per 24 hour  Intake 240 ml  Output 200 ml  Net 40 ml    Filed Weights   10/17/22 0400 10/18/22 0417 10/19/22 0300  Weight: 123.4 kg 124 kg 123.6 kg    Physical Examination:  General:  obese middle aged male in  NAD Neuro: [place]/AT, PERRL, unable to appreciate JVD  HEENT:  [place]/AT, No JVD noted, PERRL Cardiovascular:  RRR, no MRG Lungs:  Diminished bases Abdomen:  Soft, non-distended, non-tender.  Musculoskeletal:  No acute deformity. Trace edema.  Skin:  Intact, MMM  Resolved Hospital Problem List:    Assessment & Plan:   Acute respiratory failure with hypoxia, postoperative hypoxemia- related to splinting and L phrenic nerve palsy, acute pulmonary edema.  Seems less likely that he also has a right phrenic nerve issue, but remains possible.  PFTs done 12/22 show total lung capacity only 30% predicted, FVC 20% pred.   - Plans for outpatient left hemidiaphragm plication - Continue Brovana and Pulmicort.  At discharge can resume PTA Advair. 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 Without BiPAP the patient is at risk of serious medical complicated including, but not limited to, death.   Will start with BiPAP 12/5  No oxygen bleed in I have requested follow up in the pulmonary clinic regarding new BiPAP set up. Our office will be in contact with the patient during business hours to arrange visit. We can interrogate device at that time.    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 Please call Elink 7p-7a. [phone]  10/19/2022 12:21 PM

## 2022-10-19 NOTE — Plan of Care (Signed)
  Problem: Education: Goal: Knowledge of General Education information will improve Description: Including pain rating scale, medication(s)/side effects and non-pharmacologic comfort measures Outcome: Progressing   Problem: Health Behavior/Discharge Planning: Goal: Ability to manage health-related needs will improve Outcome: Progressing   Problem: Clinical Measurements: Goal: Ability to maintain clinical measurements within normal limits will improve Outcome: Progressing Goal: Will remain free from infection Outcome: Progressing Goal: Diagnostic test results will improve Outcome: Progressing Goal: Respiratory complications will improve Outcome: Progressing Goal: Cardiovascular complication will be avoided Outcome: Progressing   Problem: Activity: Goal: Risk for activity intolerance will decrease Outcome: Progressing   Problem: Nutrition: Goal: Adequate nutrition will be maintained Outcome: Progressing   Problem: Coping: Goal: Level of anxiety will decrease Outcome: Progressing   Problem: Elimination: Goal: Will not experience complications related to bowel motility Outcome: Progressing Goal: Will not experience complications related to urinary retention Outcome: Progressing   Problem: Pain Managment: Goal: General experience of comfort will improve Outcome: Progressing   Problem: Safety: Goal: Ability to remain free from injury will improve Outcome: Progressing   Problem: Skin Integrity: Goal: Risk for impaired skin integrity will decrease Outcome: Progressing   Problem: Education: Goal: Will demonstrate proper wound care and an understanding of methods to prevent future damage Outcome: Progressing Goal: Knowledge of disease or condition will improve Outcome: Progressing Goal: Knowledge of the prescribed therapeutic regimen will improve Outcome: Progressing Goal: Individualized Educational Video(s) Outcome: Progressing   Problem: Activity: Goal: Risk for  activity intolerance will decrease Outcome: Progressing   Problem: Cardiac: Goal: Will achieve and/or maintain hemodynamic stability Outcome: Progressing   Problem: Clinical Measurements: Goal: Postoperative complications will be avoided or minimized Outcome: Progressing   Problem: Respiratory: Goal: Respiratory status will improve Outcome: Progressing   Problem: Skin Integrity: Goal: Wound healing without signs and symptoms of infection Outcome: Progressing Goal: Risk for impaired skin integrity will decrease Outcome: Progressing   Problem: Urinary Elimination: Goal: Ability to achieve and maintain adequate renal perfusion and functioning will improve Outcome: Progressing   Problem: Education: Goal: Ability to describe self-care measures that may prevent or decrease complications (Diabetes Survival Skills Education) will improve Outcome: Progressing Goal: Individualized Educational Video(s) Outcome: Progressing   Problem: Coping: Goal: Ability to adjust to condition or change in health will improve Outcome: Progressing   Problem: Fluid Volume: Goal: Ability to maintain a balanced intake and output will improve Outcome: Progressing   Problem: Health Behavior/Discharge Planning: Goal: Ability to identify and utilize available resources and services will improve Outcome: Progressing Goal: Ability to manage health-related needs will improve Outcome: Progressing   Problem: Metabolic: Goal: Ability to maintain appropriate glucose levels will improve Outcome: Progressing   Problem: Nutritional: Goal: Maintenance of adequate nutrition will improve Outcome: Progressing Goal: Progress toward achieving an optimal weight will improve Outcome: Progressing   Problem: Skin Integrity: Goal: Risk for impaired skin integrity will decrease Outcome: Progressing   Problem: Tissue Perfusion: Goal: Adequacy of tissue perfusion will improve Outcome: Progressing

## 2022-10-19 NOTE — TOC CM/SW Note (Addendum)
Spoke with George Wallace with [employer] to inquire about BIPAP status for home. George Wallace reports George Wallace's ABGs did not qualify him for BIPAP or NIV. States it has been suggested for him to follow up with pulmonology in a few weeks to check co2 retention.  Nursing made aware. Spoke with George Wallace to make aware as well.    Spoke with [place] to inform the information received from [employer]. George Wallace's ABGs did not qualify him for BIPAP or NIV after all.   Raiford Noble, MSN, RN,BSN [facility] Case Manager [phone]

## 2022-10-19 NOTE — Progress Notes (Addendum)
      [address]       [place] [address]             [phone]        12 Days Post-Op Procedure(s) (LRB): XI ROBOTIC ASSISTED BILATERAL RESECTION OF MEDIASTINAL MASS  AND STERNOTOMY (Bilateral) INTERCOSTAL NERVE BLOCK  Subjective: Patient sitting in chair. He continues to feel better. He states his legs/feet are swelling more  Objective: Vital signs in last 24 hours: Temp:  [97.4 F (36.3 C)-98.2 F (36.8 C)] 97.4 F (36.3 C) (12/23 0308) Pulse Rate:  [79-83] 81 (12/23 1000) Cardiac Rhythm: Normal sinus rhythm (12/22 1900) Resp:  [14-30] 20 (12/23 0308) BP: (102-121)/(67-78) 120/69 (12/23 1000) SpO2:  [95 %-99 %] 96 % (12/23 0608) Weight:  [123.6 kg] 123.6 kg (12/23 0300)   Current Weight  10/19/22 123.6 kg      Intake/Output from previous day: 12/22 0701 - 12/23 0700 In: 600 [P.O.:600] Out: 200 [Urine:200]   Physical Exam:  Cardiovascular: RRR Pulmonary: Clear to auscultation bilaterally Abdomen: Soft, non tender, bowel sounds present. Extremities: Mild bilateral lower extremity edema. Wounds: Clean and dry.  No erythema or signs of infection.  Lab Results: CBC: No results for input(s): "WBC", "HGB", "HCT", "PLT" in the last 72 hours.  BMET:  Recent Labs    10/17/22 0404 10/18/22 0015  NA 135 133*  K 3.7 4.6  CL 94* 96*  CO2 30 29  GLUCOSE 133* 142*  BUN 20 28*  CREATININE 1.04 1.21  CALCIUM 9.2 8.9     PT/INR:  Lab Results  Component Value Date   INR 1.2 10/07/2022   INR 1.0 10/03/2022   ABG:  INR: Will add last result for INR, ABG once components are confirmed Will add last 4 CBG results once components are confirmed  Assessment/Plan:  1. CV - Previous a fib. SR this am. On Amiodarone 200 mg bid, Lisinopril 2.5 mg daily, and Toprol XL 75 mg daily. 2.  Pulmonary - Acute respiratory failure with hypoxia, postoperative hypoxemia- He was on CPAP over night (likely has OSA). On room air during day now. Continue  Brovana 3.  Expected post op acute blood loss anemia - Last H and H stable at 11.5 and 34. 4. DM-CBGs 121/119/112. On Insulin and Metformin XR 500 Q am 5. On Lovenox for DVT prophylaxis 6. Once home bi pap arranged on 2 L of [place] or less, will consider discharge 7. Patient needs hospital bed for when discharged as has difficulty with mobility and lying flat 8. Patient instructed to keep legs elevated when not walking. He has compression stockings. Will follow up with medical doctor after discharge 9. Once bi pap arranged for home, will discharge  George Ramroop M ZimmermanPA-C 10:09 AM

## 2022-10-19 NOTE — Progress Notes (Signed)
CARDIAC REHAB PHASE I   Talked with nurse who said pt is walking independently now and has walked. Some LE edema so reclined legs and reviewed education. Pt had a lot of questions so reviewed CRP2 and some general sternal care. He will also needs a CPAP which we reviewed the importance of controlling sleep apnea and working with his IS to strengthen his lungs. Pt stated that he felt much better after getting more clarity on these topics. Pt left in the bed with call bell in reach and all questions were answered.   [id number] George Wallace ACSM-CEP 10/19/2022 10:38 AM

## 2022-10-20 LAB — GLUCOSE, CAPILLARY
Glucose-Capillary: 115 mg/dL — ABNORMAL HIGH (ref 70–99)
Glucose-Capillary: 141 mg/dL — ABNORMAL HIGH (ref 70–99)
Glucose-Capillary: 138 mg/dL — ABNORMAL HIGH (ref 70–99)
Glucose-Capillary: 144 mg/dL — ABNORMAL HIGH (ref 70–99)

## 2022-10-20 MED ORDER — MELATONIN 3 MG PO TABS
3.0000 mg | ORAL_TABLET | Freq: Every evening | ORAL | Status: DC | PRN
  Administered 2022-10-20 – 2022-10-21 (×2): 3 mg via ORAL
  Filled 2022-10-20 (×2): qty 1

## 2022-10-20 NOTE — Progress Notes (Signed)
Pt states he is having difficulty sleeping and that Benadryl he has received is not helping him. Pt is requesting something new to help with sleep. Paged MD, awaiting response.

## 2022-10-20 NOTE — Progress Notes (Addendum)
NAME:  George Wallace, MRN:  [id number], DOB:  1961/06/07, LOS: 61 ADMISSION DATE:  10/07/2022 CONSULTATION DATE:  12/11 REFERRING MD:  Dr. Kipp Brood CHIEF COMPLAINT:  resection of mediastinal mass and sternotomy  History of Present Illness:  Patient is a 61 year old male with pertinent PMH of DMT2, HTN, HLD presents to [facility] on 12/11 for resection of mediastinal mass and sternotomy.  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  Biopsies were performed at [facility] that were inconclusive.  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  On 12/11 patient arrived to [facility] for elective surgery.  Resection of mediastinal mass was performed. 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  It was taken en bloc.  Required sternotomy to anatomy at the vein with cessation of bleeding.  Postop transferred to ICU intubated.  PCCM consulted.  Pertinent Medical History:   Past Medical History:  Diagnosis Date   Diabetes ([place])    Diet controlled   Hyperlipidemia      Significant Hospital Events:  12/11 - s/p sternotomy and resection of mediastinal mass; post op intubated 12/12 extubated 12/13 BIPAP intermittent 12/14 Afib on amio  Interim History / Subjective:  Patient on room air, sats 97% Ambulating well No complaints Breathing comfortably. Nearing discharge.  [employer] [facility] has denied BiPAP, state he does not qualify.   Objective:  Blood pressure 113/76, pulse 82, temperature 98.2 F (36.8 C), temperature source Oral, resp. rate (!) 22, height 5\' 8"  (1.727 m), weight 124 kg, SpO2 97 %.    FiO2 (%):  [21 %] 21 %   Intake/Output Summary (Last 24 hours) at 10/20/2022 1149 Last data filed at 10/20/2022 [id number] Gross per 24 hour  Intake 240 ml  Output 400 ml  Net -160 ml   Filed Weights   10/18/22 0417 10/19/22 0300 10/20/22 0336  Weight: 124 kg 123.6  kg 124 kg    Physical Examination:  General:  obese middle aged male in NAD, OOB in chair Neuro: [place]/AT, PERRL, unable to appreciate JVD  HEENT:  /AT, No JVD noted, PERRL Cardiovascular:  RRR, no MRG Lungs:  Bilateral chest excursion, clear, no wheeze, Diminished bases Abdomen:  Soft, non-distended, non-tender. , Body mass index is 41.57 kg/m.  Musculoskeletal:  No acute deformity. Trace edema lower extremities.  Skin:  Intact, MMM  Resolved Hospital Problem List:    Assessment & Plan:   Acute respiratory failure with hypoxia, postoperative hypoxemia- related to splinting and L phrenic nerve palsy, acute pulmonary edema.  Seems less likely that he also has a right phrenic nerve issue, but remains possible.  PFTs done 12/22 show total lung capacity only 30% predicted, FVC 20% pred.   - Plans for outpatient left hemidiaphragm plication - Continue Brovana and Pulmicort.  At discharge can resume PTA Advair. - Order placed for DME BiPAP on 12/23>> denied by [employer] as not meeting criteria for BiPAP 12/23>> They suggested he do a follow up with Pulmonary office for CO2 retention check at that time.   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 Without BiPAP the patient is at risk of serious medical complicated including, but not limited to, death.   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 Please call Elink 7p-7a. [phone]  10/20/2022 11:49 AM

## 2022-10-20 NOTE — Progress Notes (Signed)
      [address]       [place] [address]             [phone]        13 Days Post-Op Procedure(s) (LRB): XI ROBOTIC ASSISTED BILATERAL RESECTION OF MEDIASTINAL MASS  AND STERNOTOMY (Bilateral) INTERCOSTAL NERVE BLOCK  Subjective: Patient sitting in chair watching church on his computer. No specific complaint this am  Objective: Vital signs in last 24 hours: Temp:  [97.4 F (36.3 C)-98.2 F (36.8 C)] 98.2 F (36.8 C) (12/24 0727) Pulse Rate:  [73-82] 82 (12/24 0727) Cardiac Rhythm: Normal sinus rhythm;Other (Comment) (12/24 0148) Resp:  [15-24] 22 (12/24 0928) BP: (94-124)/(56-76) 113/76 (12/24 0928) SpO2:  [94 %-100 %] 97 % (12/24 0806) FiO2 (%):  [21 %] 21 % (12/24 0007) Weight:  [914 kg] 124 kg (12/24 0336)   Current Weight  10/20/22 124 kg      Intake/Output from previous day: 12/23 0701 - 12/24 0700 In: 240 [P.O.:240] Out: 400 [Urine:400]   Physical Exam:  Cardiovascular: RRR Pulmonary: Clear to auscultation bilaterally Abdomen: Soft, non tender, bowel sounds present. Extremities: Mild bilateral lower extremity edema. Compression stockings in place Wounds: Clean and dry.  No erythema or signs of infection.  Lab Results: CBC: No results for input(s): "WBC", "HGB", "HCT", "PLT" in the last 72 hours.  BMET:  Recent Labs    10/18/22 0015  NA 133*  K 4.6  CL 96*  CO2 29  GLUCOSE 142*  BUN 28*  CREATININE 1.21  CALCIUM 8.9     PT/INR:  Lab Results  Component Value Date   INR 1.2 10/07/2022   INR 1.0 10/03/2022   ABG:  INR: Will add last result for INR, ABG once components are confirmed Will add last 4 CBG results once components are confirmed  Assessment/Plan:  1. CV - Previous a fib. SR this am. On Amiodarone 200 mg bid, Lisinopril 2.5 mg daily, and Toprol XL 75 mg daily. 2.  Pulmonary - Acute respiratory failure with hypoxia, postoperative hypoxemia- He was on CPAP over night (likely has OSA). On room air during  day now. Continue Brovana 3.  Expected post op acute blood loss anemia - Last H and H stable at 11.5 and 34. 4. DM-CBGs 162/127/115. On Insulin and Metformin XR 500 Q am 5. On Lovenox for DVT prophylaxis 6. Once home bi pap arranged on 2 L of [place] or less, will consider discharge 7. Patient needs hospital bed for when discharged as has difficulty with mobility and lying flat 8. Patient instructed to keep legs elevated when not walking. He has compression stockings. Will follow up with medical doctor after discharge 9. Unfortunately, notified yesterday that patient did not qualifyfor home bi pap. Await pulmonary recommendation.  Jordynne Mccown M ZimmermanPA-C 10:35 AM

## 2022-10-20 NOTE — Plan of Care (Signed)
  Problem: Clinical Measurements: Goal: Respiratory complications will improve Outcome: Progressing Goal: Cardiovascular complication will be avoided Outcome: Progressing   Problem: Activity: Goal: Risk for activity intolerance will decrease Outcome: Progressing   Problem: Nutrition: Goal: Adequate nutrition will be maintained Outcome: Progressing   Problem: Elimination: Goal: Will not experience complications related to urinary retention Outcome: Progressing   Problem: Pain Managment: Goal: General experience of comfort will improve Outcome: Not Progressing   

## 2022-10-21 ENCOUNTER — Telehealth: Payer: Self-pay | Admitting: Acute Care

## 2022-10-21 DIAGNOSIS — I700 Atherosclerosis of aorta: Secondary | ICD-10-CM

## 2022-10-21 DIAGNOSIS — I517 Cardiomegaly: Secondary | ICD-10-CM

## 2022-10-21 LAB — GLUCOSE, CAPILLARY
Glucose-Capillary: 104 mg/dL — ABNORMAL HIGH (ref 70–99)
Glucose-Capillary: 105 mg/dL — ABNORMAL HIGH (ref 70–99)
Glucose-Capillary: 100 mg/dL — ABNORMAL HIGH (ref 70–99)
Glucose-Capillary: 149 mg/dL — ABNORMAL HIGH (ref 70–99)

## 2022-10-21 NOTE — Progress Notes (Addendum)
NAME:  [facility], MRN:  [id number], DOB:  [birth year], LOS: 14 ADMISSION DATE:  10/07/2022 CONSULTATION DATE:  12/11 REFERRING MD:  Dr. Cliffton Asters CHIEF COMPLAINT:  resection of mediastinal mass and sternotomy  History of Present Illness:  Patient is a 61 year old male with pertinent PMH of DMT2, HTN, HLD presents to [facility] on 12/11 for resection of mediastinal mass and sternotomy.  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  Biopsies were performed at [facility] that were inconclusive.  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  On 12/11 patient arrived to [facility] for elective surgery.  Resection of mediastinal mass was performed. 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  It was taken en bloc.  Required sternotomy to anatomy at the vein with cessation of bleeding.  Postop transferred to ICU intubated.  PCCM consulted.  Pertinent Medical History:   Past Medical History:  Diagnosis Date   Diabetes (HCC)    Diet controlled   Hyperlipidemia      Significant Hospital Events:  12/11 - s/p sternotomy and resection of mediastinal mass; post op intubated 12/12 extubated 12/13 BIPAP intermittent 12/14 Afib on amio  Interim History / Subjective:  Patient on BiPAP, sats are 96% Ambulating well States he is wearing his BiPAP during the day to see if it helps with his daytime sleepiness. Breathing comfortably. Nearing discharge.  [facility] has denied BiPAP, state he does not qualify.>> Will need case management to follow up on why he was denied Home BiPAP.   Objective:  Blood pressure 118/76, pulse 82, temperature 98.1 F (36.7 C), temperature source Oral, resp. rate 19, height 5\' 8"  (1.727 m), weight 124.2 kg, SpO2 96 %.    FiO2 (%):  [21 %] 21 %   Intake/Output Summary (Last 24 hours) at 10/21/2022 1524 Last data filed at 10/21/2022  1200 Gross per 24 hour  Intake 720 ml  Output --  Net 720 ml   Filed Weights   10/19/22 0300 10/20/22 0336 10/21/22 0348  Weight: 123.6 kg 124 kg 124.2 kg    Physical Examination:  General:  obese middle aged male in NAD, OOB in chair, wearing CPAP Neuro: [place]/AT, PERRL, unable to appreciate JVD  HEENT:  [place]/AT, No JVD noted, PERRL Cardiovascular:  RRR, no MRG Lungs:  Bilateral chest excursion, clear, no wheeze, Diminished per bases Abdomen:  Soft, non-distended, non-tender. , Body mass index is 41.63 kg/m.  Musculoskeletal:  No acute deformity. Trace edema lower extremities.  Skin:  Intact, MMM  Resolved Hospital Problem List:    Assessment & Plan:   Acute respiratory failure with hypoxia, postoperative hypoxemia- related to splinting and L phrenic nerve palsy, acute George edema.  Seems less likely that he also has a right phrenic nerve issue, but remains possible.  PFTs done 12/22 show total lung capacity only 30% predicted, FVC 20% pred.   - Plans for outpatient left hemidiaphragm plication - Continue Brovana and Pulmicort.  At discharge can resume PTA Advair. - Order placed for [facility] BiPAP on 12/23>> denied by [facility] as not meeting criteria for BiPAP 12/23>> They suggested he do a follow up with [facility] for CO2 retention check at that time.   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 Without BiPAP the patient is at risk  of serious medical complicated including, but not limited to, death.   Plan was to start with BiPAP 12/5  No oxygen bleed in He was denied therapy per [facility] stating he did not qualify for home BiPAP He will need immediate OP clinic visit to get evaluated for a device Will discuss further with case management / insurance once they are open after the holiday. Pt states he is willing to pay for the first  month in order to get necessary equipment for bi pap. Will have to discuss with case management  Case management consult to review reason for failing to qualify for home BiPAP Have notified [facility] patient will need OV asap for sleep consult/ in lab split night sleep study if we are unable to get his need for treatment with BiPAP  addressed as an inpatient.     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 Please call Elink 7p-7a. [phone]  10/21/2022 3:24 PM

## 2022-10-21 NOTE — Progress Notes (Addendum)
      [address]       [employer] [place] [address]             [phone]        14 Days Post-Op Procedure(s) (LRB): XI ROBOTIC ASSISTED BILATERAL RESECTION OF MEDIASTINAL MASS  AND STERNOTOMY (Bilateral) INTERCOSTAL NERVE BLOCK  Subjective: Patient with no specific complaint this am  Objective: Vital signs in last 24 hours: Temp:  [97.8 F (36.6 C)-98 F (36.7 C)] 98 F (36.7 C) (12/25 0711) Pulse Rate:  [73-79] 76 (12/25 0711) Cardiac Rhythm: Normal sinus rhythm;Bundle branch block (12/25 0850) Resp:  [18-25] 20 (12/25 0711) BP: (102-134)/(55-80) 108/55 (12/25 0711) SpO2:  [93 %-97 %] 96 % (12/25 0909) FiO2 (%):  [21 %] 21 % (12/25 0021) Weight:  [124.2 kg] 124.2 kg (12/25 0348)   Current Weight  10/21/22 124.2 kg      Intake/Output from previous day: 12/24 0701 - 12/25 0700 In: 480 [P.O.:480] Out: -    Physical Exam:  Cardiovascular: RRR Pulmonary: Clear to auscultation bilaterally Abdomen: Soft, non tender, bowel sounds present. Extremities: Mild bilateral lower extremity edema. Compression stockings in place Wounds: Clean and dry.  No erythema or signs of infection.  Lab Results: CBC: No results for input(s): "WBC", "HGB", "HCT", "PLT" in the last 72 hours.  BMET:  No results for input(s): "NA", "K", "CL", "CO2", "GLUCOSE", "BUN", "CREATININE", "CALCIUM" in the last 72 hours.   PT/INR:  Lab Results  Component Value Date   INR 1.2 10/07/2022   INR 1.0 10/03/2022   ABG:  INR: Will add last result for INR, ABG once components are confirmed Will add last 4 CBG results once components are confirmed  Assessment/Plan:  1. CV - Previous a fib. SR this am. On Amiodarone 200 mg bid, Lisinopril 2.5 mg daily, and Toprol XL 75 mg daily. 2.  Pulmonary - Acute respiratory failure with hypoxia, postoperative hypoxemia- He was on CPAP over night (likely has OSA). On room air during day now. Continue Brovana 3.  Expected post op acute blood loss  anemia - Last H and H stable at 11.5 and 34. 4. DM-CBGs 138/144/104. On Insulin and Metformin XR 500 Q am 5. On Lovenox for DVT prophylaxis 6. Once home bi pap arranged on 2 L of [place] or less, will consider discharge 7. Patient needs hospital bed for when discharged as has difficulty with mobility and lying flat 8. Patient instructed to keep legs elevated when not walking. He has compression stockings. Will follow up with medical doctor after discharge 9. Unfortunately, notified  that patient did not qualifyfor home bi pap. Per pulmonary, he is high risk for developing hypercapnia long term, despite not having it now.He needs to follow up with pulmonary/sleep clinic (he will be contacted). Pulmonary encouraged him to sleep sitting up. Per patient willing to pay for the first month to get necessary equipment for bi pap. Will have to discuss with social work  Sara Lee M ZimmermanPA-C 10:08 AM  DR Delia Chimes ADDENDUM No sig updates Using therapy at night This has not been approved for home Further discussions when insurance office open Patient interested in paying for first month alone if they dont cover

## 2022-10-21 NOTE — Progress Notes (Signed)
Pt placed himself on CPAP for the night.

## 2022-10-21 NOTE — Telephone Encounter (Signed)
Pt. Is inpatient. He was denied BiPAP per [employer]. Please call Monday and get patient scheduled for sleep eval / sleep study to see if we can get him his BiPAP machine. Will also have social work get involved Tuesday here in the hospital.   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 High risk for developing hypercapnia long-term despite not having it now. Most likely the NIPPV he has been  using during this admission has prevented this. -Follow up in Pulm / sleep medicine clinic requested on 12/23-- they will contact him to schedule an appointment. -Will likely do best if he continues to sleep sitting up at home due to diaphragm weakness. -Maintain euvolemia.  Thanks so much

## 2022-10-21 NOTE — Progress Notes (Signed)
Pt in no distress at this time requiring bipap.  Rt will cont to monitor. 

## 2022-10-22 LAB — GLUCOSE, CAPILLARY
Glucose-Capillary: 95 mg/dL (ref 70–99)
Glucose-Capillary: 115 mg/dL — ABNORMAL HIGH (ref 70–99)

## 2022-10-22 MED ORDER — AMIODARONE HCL 200 MG PO TABS: 200.0000 mg | ORAL_TABLET | Freq: Two times a day (BID) | ORAL | 1 refills | Status: DC

## 2022-10-22 MED ORDER — LISINOPRIL 2.5 MG PO TABS: 2.5000 mg | ORAL_TABLET | Freq: Every day | ORAL | 1 refills | Status: DC

## 2022-10-22 MED ORDER — ASPIRIN 325 MG PO TBEC: 325.0000 mg | DELAYED_RELEASE_TABLET | Freq: Every day | ORAL | Status: AC

## 2022-10-22 MED ORDER — METOPROLOL SUCCINATE ER 25 MG PO TB24: 75.0000 mg | ORAL_TABLET | Freq: Every day | ORAL | 1 refills | Status: DC

## 2022-10-22 MED ORDER — OXYCODONE HCL 5 MG PO TABS: 5.0000 mg | ORAL_TABLET | Freq: Four times a day (QID) | ORAL | 0 refills | Status: DC | PRN

## 2022-10-22 MED ORDER — TAMSULOSIN HCL 0.4 MG PO CAPS: 0.4000 mg | ORAL_CAPSULE | Freq: Every day | ORAL | 1 refills | Status: DC

## 2022-10-22 NOTE — Progress Notes (Addendum)
      [address]       [place] [address]             [phone]        15 Days Post-Op Procedure(s) (LRB): XI ROBOTIC ASSISTED BILATERAL RESECTION OF MEDIASTINAL MASS  AND STERNOTOMY (Bilateral) INTERCOSTAL NERVE BLOCK  Subjective: Patient with no specific complaint this am  Objective: Vital signs in last 24 hours: Temp:  [97.1 F (36.2 C)-98.2 F (36.8 C)] 97.1 F (36.2 C) (12/26 0404) Pulse Rate:  [64-82] 68 (12/26 0713) Cardiac Rhythm: Normal sinus rhythm (12/26 0733) Resp:  [12-20] 18 (12/26 0713) BP: (90-118)/(48-76) 108/57 (12/26 0713) SpO2:  [96 %-98 %] 98 % (12/26 0609) FiO2 (%):  [21 %] 21 % (12/26 0609) Weight:  [125.1 kg-125.4 kg] 125.1 kg (12/26 0640)   Current Weight  10/22/22 125.1 kg      Intake/Output from previous day: 12/25 0701 - 12/26 0700 In: 720 [P.O.:720] Out: -    Physical Exam:  Cardiovascular: RRR Pulmonary: Clear to auscultation bilaterally Abdomen: Soft, non tender, bowel sounds present. Extremities: Mild bilateral lower extremity edema. Compression stockings in place Wounds: Clean and dry.  No erythema or signs of infection.  Lab Results: CBC: No results for input(s): "WBC", "HGB", "HCT", "PLT" in the last 72 hours.  BMET:  No results for input(s): "NA", "K", "CL", "CO2", "GLUCOSE", "BUN", "CREATININE", "CALCIUM" in the last 72 hours.   PT/INR:  Lab Results  Component Value Date   INR 1.2 10/07/2022   INR 1.0 10/03/2022   ABG:  INR: Will add last result for INR, ABG once components are confirmed Will add last 4 CBG results once components are confirmed  Assessment/Plan:  1. CV - Previous a fib. SR this am. On Amiodarone 200 mg bid, Lisinopril 2.5 mg daily, and Toprol XL 75 mg daily. 2.  Pulmonary - Acute respiratory failure with hypoxia, postoperative hypoxemia- He was on CPAP over night (likely has OSA). On room air during day now. Continue Brovana 3.  Expected post op acute blood loss anemia -  Last H and H stable at 11.5 and 34. 4. DM-CBGs 100/149/95. On Insulin and Metformin XR 500 Q am 5. On Lovenox for DVT prophylaxis 6. Once home bi pap arranged on 2 L of [place] or less, will consider discharge 7. Patient needs hospital bed for when discharged as has difficulty with mobility and lying flat 8. Patient instructed to keep legs elevated when not walking. He has compression stockings. Will follow up with medical doctor after discharge 9. Unfortunately, notified  that patient did not qualifyfor home bi pap. Per pulmonary, he is high risk for developing hypercapnia long term, despite not having it now.He needs to follow up with pulmonary/sleep clinic (he will be contacted). Pulmonary encouraged him to sleep sitting up. Per patient willing to pay for the first month to get necessary equipment for bi pap. Will have to discuss with social work  [employer] 9:49 AM   Agree with above Dispo planning  Kenyonna Micek [employer]

## 2022-10-22 NOTE — Progress Notes (Signed)
Occupational Therapy Treatment Patient Details Name: [facility] MRN: [id number] DOB: 1961-08-29 Today's Date: 10/22/2022   History of present illness Pt is a 61 y.o. 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 Pt requiring intermittent NIV post-op. plan for possible return to OR Wed, 12/20. 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   OT comments  Patient has made good gains with OT treatment, meeting goals for LB dressing, grooming, and toileting with patient being supervision to perform. Patient stated he had anxiety over getting short of breath or fatigued when up and performing mobility. Patient was provided handout for energy conservation strategies and reviewed with patient. Patient would benefit from further OT services with [facility] to address safety with self care and energy conservation strategies.    Recommendations for follow up therapy are one component of a multi-disciplinary discharge planning process, led by the attending physician.  Recommendations may be updated based on patient status, additional functional criteria and insurance authorization.    Follow Up Recommendations  Home health OT     Assistance Recommended at Discharge Intermittent Supervision/Assistance  Patient can return home with the following  A little help with walking and/or transfers;Assistance with cooking/housework;Assist for transportation;Help with stairs or ramp for entrance;A little help with bathing/dressing/bathroom   Equipment Recommendations  None recommended by OT    Recommendations for Other Services      Precautions / Restrictions Precautions Precautions: Fall;Sternal Precaution Comments: reviewed sternal precautions Restrictions Weight Bearing Restrictions: Yes RUE Weight Bearing:  Non weight bearing LUE Weight Bearing: Non weight bearing Other Position/Activity Restrictions: UE sternal percautions       Mobility Bed Mobility Overal bed mobility: Needs Assistance             General bed mobility comments: OOB upon arrival    Transfers Overall transfer level: Needs assistance Equipment used: None Transfers: Sit to/from Stand Sit to Stand: Supervision           General transfer comment: supervision for sit to stands and transfers wtih good awareness of sternal precautions     Balance Overall balance assessment: Needs assistance Sitting-balance support: Feet supported, No upper extremity supported Sitting balance-Leahy Scale: Good     Standing balance support: During functional activity, No upper extremity supported Standing balance-Leahy Scale: Good Standing balance comment: able to perform LB dressing and grooming standing without UE support                           ADL either performed or assessed with clinical judgement   ADL Overall ADL's : Needs assistance/impaired     Grooming: Wash/dry hands;Wash/dry face;Supervision/safety;Standing               Lower Body Dressing: Supervision/safety;Sit to/from stand Lower Body Dressing Details (indicate cue type and reason): donned shoes while standing Toilet Transfer: Supervision/safety;Ambulation Toilet Transfer Details (indicate cue type and reason): able to walk to bathroom without an assistive device Toileting- Clothing Manipulation and Hygiene: Modified independent         General ADL Comments: improvement with self care and mobility    Extremity/Trunk Assessment              Vision       Perception     Praxis      Cognition Arousal/Alertness:  Awake/alert Behavior During Therapy: WFL for tasks assessed/performed Overall Cognitive Status: Within Functional Limits for tasks assessed                                 General Comments:  concerned over becoming short of break with mobility        Exercises      Shoulder Instructions       General Comments provided patient with energy conservation strategies handout and reviewed with patient.    Pertinent Vitals/ Pain       Pain Assessment Pain Assessment: Faces Faces Pain Scale: No hurt Pain Intervention(s): Monitored during session  Home Living                                          Prior Functioning/Environment              Frequency  Min 2X/week        Progress Toward Goals  OT Goals(current goals can now be found in the care plan section)  Progress towards OT goals: Progressing toward goals  Acute Rehab OT Goals Patient Stated Goal: get better OT Goal Formulation: With patient Time For Goal Achievement: 10/22/22 Potential to Achieve Goals: Good ADL Goals Pt Will Perform Grooming: with min guard assist;standing Pt Will Perform Upper Body Dressing: with supervision;sitting Pt Will Perform Lower Body Dressing: with min guard assist;sit to/from stand Pt Will Transfer to Toilet: with supervision;ambulating Pt Will Perform Toileting - Clothing Manipulation and hygiene: with supervision;sitting/lateral leans  Plan Discharge plan remains appropriate    Co-evaluation                 AM-PAC OT "6 Clicks" Daily Activity     Outcome Measure   Help from another person eating meals?: None Help from another person taking care of personal grooming?: A Little Help from another person toileting, which includes using toliet, bedpan, or urinal?: A Little Help from another person bathing (including washing, rinsing, drying)?: A Little Help from another person to put on and taking off regular upper body clothing?: A Little Help from another person to put on and taking off regular lower body clothing?: A Little 6 Click Score: 19    End of Session    OT Visit Diagnosis: Unsteadiness on feet (R26.81);Other abnormalities of  gait and mobility (R26.89);Repeated falls (R29.6);Muscle weakness (generalized) (M62.81)   Activity Tolerance Patient tolerated treatment well   Patient Left in chair;with call bell/phone within reach   Nurse Communication Mobility status        Time: 4827-0786 OT Time Calculation (min): 25 min  Charges: OT General Charges $OT Visit: 1 Visit OT Treatments $Self Care/Home Management : 8-22 mins $Therapeutic Activity: 8-22 mins  Alfonse Flavors, OTA Acute Rehabilitation Services  Office [phone]   Dewain Penning 10/22/2022, 2:45 PM

## 2022-10-22 NOTE — Progress Notes (Signed)
Mobility Specialist Progress Note    10/22/22 0910  Mobility  Activity Ambulated independently in hallway  Level of Assistance Independent  Assistive Device None  Distance Ambulated (ft) 420 ft  Activity Response Tolerated well  $Mobility charge 1 Mobility   Pre-Mobility: 73 HR During Mobility: 94 HR Post-Mobility: 75 HR, 136/67 (85) BP  Pt received in chair and agreeable. No complaints on walk. Returned to chair with call bell in reach.    [place] Mobility Specialist  Please Neurosurgeon or Rehab Office at [phone]

## 2022-10-22 NOTE — Care Management (Signed)
[facility] is a 61 y/o male with Acute on Chronic respiratory Failure secondary to Restrictive Lung Disease due Diaphragmatic paralysis [J98.6]. Patient presented to ED with complaints of sob and respiratory failure. Upon admission (10/07/2022), patient was placed on Bipap ST/Avaps  with a continued failed ABG due to hypercapnia with a PCO2=56.8 on 10/07/2022. Due to patient's condition, he is at risk of worsening of Respiratory failure and Severe Restrictive Lung Disease . Bipap ST/Avaps has been tried and failed considered and ruled out, however patient will require volume ventilation to promote gas exchange via NIV therapy. Patient also requires mouthpiece ventilation for daytime as the pco2 elevates during the day. Due to the severity of the patient and the need for daytime mouthpiece ventilation, back up battery, alarms and portability an NIV is required which is not supported by the bipap/rad/bipap avaps device." Patient is able to keep airway clear and clear secretions.

## 2022-10-22 NOTE — Telephone Encounter (Signed)
Appt has been scheduled for 10/29/22 with Maralyn Sago

## 2022-10-22 NOTE — Care Management (Signed)
08:13 Requested [employer] to assess for BiPAP/ NIV.

## 2022-10-22 NOTE — TOC Progression Note (Addendum)
Transition of Care [facility]) - Progression Note    Patient Details  Name: George Wallace MRN: [id number] Date of Birth: [birth year]  Transition of Care [facility]) CM/SW Contact  Lawerance Sabal, RN Phone Number: 10/22/2022, 12:00 PM  Clinical Narrative:     Spoke w patient at bedside. Informed him I was able to get auth for the Bipap with [employer]. He expressed gratitude and stated that whichever agency can get him the BiPAP he is agreeable to use. Orders and medical necessity note completed, and scanned to [facility]. Patient states that he will need bari rollator due to seat size. Order modified and I arranged with [employer] w [employer] to switch out a bari rollator with the new one he has at home. Patient states that all other needed DME has been delivered to his home already. DC pending BiPAP delivery. PA updated that it will be either today or tomorrow. Updated [facility] that patient will DC either today or tomorrow  15:45 Notified PA that BiPAP for home has been delivered to the room.    Expected Discharge Plan: Home w Home Health Services Barriers to Discharge: Continued Medical Work up  Expected Discharge Plan and Services In-house Referral: NA Discharge Planning Services: CM Consult Post Acute Care Choice: Home Health, Durable Medical Equipment Living arrangements for the past 2 months: Single Family Home                 DME Arranged: Bipap DME Agency: [employer] Date DME Agency Contacted: 10/22/22 Time DME Agency Contacted: 1142 Representative spoke with at DME Agency: Vaughan Basta HH Arranged: PT, RN   Date HH Agency Contacted: 10/22/22 Time HH Agency Contacted: 1200 Representative spoke with at [facility] Agency: Theodosia Quay [facility]   Social Determinants of Health (SDOH) Interventions SDOH Screenings   Food Insecurity: No Food Insecurity (10/07/2022)  Housing: Low Risk  (10/07/2022)  Transportation Needs: No Transportation Needs (10/07/2022)  Utilities: Not  At Risk (10/07/2022)  Tobacco Use: Low Risk  (10/08/2022)    Readmission Risk Interventions     No data to display

## 2022-10-22 NOTE — Progress Notes (Signed)
[facility] PCCM  PCCM available prn  [name], MD [place] PCCM Pager: [phone] Cell: [phone] After 7:00 pm call Elink  [phone]

## 2022-10-22 NOTE — Progress Notes (Signed)
Pt was informed that he wouldn't qualify for CRPII but was encouraged to seek outpt referral for pulmonary rehab at [facility].

## 2022-10-23 ENCOUNTER — Other Ambulatory Visit (HOSPITAL_COMMUNITY): Payer: Self-pay

## 2022-10-23 ENCOUNTER — Telehealth: Payer: Self-pay

## 2022-10-23 ENCOUNTER — Other Ambulatory Visit: Payer: Self-pay | Admitting: Physician Assistant

## 2022-10-23 ENCOUNTER — Other Ambulatory Visit: Payer: Self-pay | Admitting: *Deleted

## 2022-10-23 LAB — GLUCOSE, CAPILLARY: Glucose-Capillary: 106 mg/dL — ABNORMAL HIGH (ref 70–99)

## 2022-10-23 MED ORDER — ROSUVASTATIN CALCIUM 10 MG PO TABS
10.0000 mg | ORAL_TABLET | Freq: Every day | ORAL | 0 refills | Status: DC
  Filled 2022-10-23: qty 30, 30d supply, fill #0

## 2022-10-23 MED ORDER — OXYCODONE HCL 5 MG PO TABS
5.0000 mg | ORAL_TABLET | Freq: Four times a day (QID) | ORAL | 0 refills | Status: AC | PRN
  Filled 2022-10-23: qty 28, 7d supply, fill #0

## 2022-10-23 NOTE — Progress Notes (Signed)
Patient's wife contacted the office stating Oxycodone 5mg  is out of stock at the [facility]. Per wife, outpatient pharmacy has medication in stock. Wonda Olds, PA, sent new prescription into [facility] pharmacy. [facility] to [facility] prescription. Per wife, patient also out of rosuvastatin. Prescription also sent to [facility] outpatient pharmacy. Advised patient further refills need to come from PCP. Follow up appt with [facility] also made as last one was cancelled due to patient still being hospitalized. Patient made aware.

## 2022-10-23 NOTE — Telephone Encounter (Signed)
STD/FMLA form completed and faxed to [facility] @ [phone]. Beginning LOA 10/07/22 through 01/06/23. Date of surgery 10/07/22

## 2022-10-29 ENCOUNTER — Other Ambulatory Visit (HOSPITAL_COMMUNITY): Payer: Self-pay

## 2022-10-29 ENCOUNTER — Telehealth: Payer: Self-pay | Admitting: Acute Care

## 2022-10-29 ENCOUNTER — Other Ambulatory Visit: Payer: Self-pay

## 2022-10-29 ENCOUNTER — Encounter: Payer: Self-pay | Admitting: Acute Care

## 2022-10-29 ENCOUNTER — Ambulatory Visit (INDEPENDENT_AMBULATORY_CARE_PROVIDER_SITE_OTHER): Payer: BC Managed Care – PPO

## 2022-10-29 ENCOUNTER — Ambulatory Visit (INDEPENDENT_AMBULATORY_CARE_PROVIDER_SITE_OTHER): Payer: BC Managed Care – PPO | Admitting: Acute Care

## 2022-10-29 VITALS — BP 122/68 | HR 82 | Temp 98.2°F | Ht 69.0 in | Wt 282.8 lb

## 2022-10-29 DIAGNOSIS — J986 Disorders of diaphragm: Secondary | ICD-10-CM

## 2022-10-29 DIAGNOSIS — J9859 Other diseases of mediastinum, not elsewhere classified: Secondary | ICD-10-CM | POA: Diagnosis not present

## 2022-10-29 DIAGNOSIS — R0602 Shortness of breath: Secondary | ICD-10-CM | POA: Diagnosis not present

## 2022-10-29 DIAGNOSIS — R079 Chest pain, unspecified: Secondary | ICD-10-CM | POA: Diagnosis not present

## 2022-10-29 DIAGNOSIS — J984 Other disorders of lung: Secondary | ICD-10-CM

## 2022-10-29 MED ORDER — ALBUTEROL SULFATE HFA 108 (90 BASE) MCG/ACT IN AERS
2.0000 | INHALATION_SPRAY | RESPIRATORY_TRACT | 2 refills | Status: AC | PRN
  Filled 2022-10-29: qty 1, fill #0

## 2022-10-29 NOTE — Patient Instructions (Addendum)
CXR  today  I will call you with the results.  If there is fluid, I will send in  a prescription for Lasix.  We will order a heart echo to evaluate heart function.  I will call you with these results.  Continue wearing BiPAP as you have been doing.  We will send in an albuterol inhaler  Use 1-2 puffs as needed for shortness of breath or wheezing, no more than 4 times daily.  Oxygen saturations should be greater than 88% at all times.  Follow up with Dr. Kipp Brood 10/02/2023. As is scheduled. Follow up with Dr. Leonides Schanz 11/04/2022 as is scheduled.  We will refer to pulmonary rehab.  Please contact office for sooner follow up if symptoms do not improve or worsen or seek emergency care

## 2022-10-29 NOTE — Telephone Encounter (Signed)
Please call patient and see if he will  come to get a BMET, BNP drawn 10/30/2022. If he agrees, please place orders. Thanks.

## 2022-10-29 NOTE — Progress Notes (Signed)
History of Present Illness George Wallace is a 62 y.o. male with history of HTN, DM2, Metabolic syndrome, Obesity, and atrial fibrillation who had an incidental finding of thymic mass and is  s/p resection 09/2022 ( hyperplasia on path ) by Dr. 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 It was taken en bloc. Required sternotomy to anatomy at the vein with cessation of bleeding. He requires BiPap therapy to prevent CO2 narcosis due to diaphragm weakness. He was followed by Pulmonary critical care as an inpatient for high risk of CO2 narcosis as a results of left phrenic nerve paralysis. He will be followed as an outpatient for BiPap management  Synopsis 62 y.o. 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  He was originally undergoing a CT scan for coronary calcium score, and a nodular mass was identified. PET/CT scan which showed avidity in the anterior mediastinal mass concerning for lymphoma .Biopsies at [facility] were nonconclusive. 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 Cliffton Asters on 10/07/2022. Some of the left phrenic nerve was deeply invested into the tumor. It was taken en bloc . He has left hemidiaphragm elevation as a results of the left phrenic nerve removal. He was treated post op in the ICU , he was extubated the day of surgery, and placed on BiPAP. He had chest tubes with minimal output, they were removed 12/13. He was restarted on Lisinopril for hypertension. When he was discharged from the hospital he was on Aptos with BiPAP at night. He will need his BiPAP for life to prevent CO2 narcosis.   12/11 - s/p sternotomy and resection of mediastinal mass; post op intubated 12/12 extubated 12/13 BIPAP  intermittent 12/14 Afib on amio   10/29/2022 Pt. Presents for follow up after discharge post thymic mass  resection . He was discharged after finally receiving his BiPAP machine. BiPAP is set at IPAP of 12. EPAP of 5 and 40%. Since he has been home he has had worsening shortness of breath. He has swelling in his hands or feet. He was on Lasix as an inpatient , but was not discharged on it. CXR today shows no pulmonary edema, but was + for atelectasis and scarring. He has an oxygen saturation of 98% today in the office on room air. He states he feels like his work of breathing is more, but that he feels comfortable on his BiPAP. He has been referred to Pulmonary rehab, but has not been called yet. I explained that this is most likely due to the holiday schedules, and that I suspect he will get a call soon.   He is here with his BiPAP today. He states he has periods where he feels very uncomfortable, especially when he sits in a low chair. He has a lift chair at home, which prevents this.. He has lower extremity edema 2+, and his hands are about 1 +. He is a bit anxious. He does wear his BiPAP whenever he takes a nap and as needed at home. He states that post op he has had poor appetite. He is trying to eat more protein. I suggested Boost or Ensure.Weight pre-op was 295 lbs, at discharge it was   276 pounds ( 10/22/2022) and today in the office it is 288 lbs.  He is taking oxycodone for surgical pain. He states he has had a hard time sleeping through the night, as his sleep cycle was so disrupted while in the hospital.   Sternal incision looks good. It is approximating. There is a small scab at the top of the incision. No drainage or redness. Chest tube sites are healing. Pt. Is ambulating with a walker with his BiPAP in case he feels he needs it.    Test Results: CXR 10/29/2022 Cardiac size is within normal limits. There are no signs of alveolar pulmonary edema. There are linear densities in left mid and  both lower lung fields. There is elevation of left hemidiaphragm. Left lateral CP angle is indistinct. There is no pneumothorax. Metallic sutures are seen in sternum. No significant interval changes are noted in the lung fields.   IMPRESSION: There are linear densities in left mid and both lower lung fields suggesting scarring and subsegmental atelectasis. Left hemidiaphragm is elevated. 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 Lymph nodes with reactive lymphoid hyperplasia. Negative for malignancy.  There are multiple cysts lined by attenuated benign squamous epithelium surrounded by thymic tissue showing florid reactive lymphoid hyperplasia.  There are also lymph nodes present which show reactive hyperplasia.  Flow cytometry performed on the specimen ([id number]) does not show a monoclonal B-cell population or abnormal T-cell phenotype.      Latest Ref Rng & Units 10/16/2022    5:45 AM 10/15/2022    6:08 AM 10/14/2022    1:33 AM  CBC  WBC 4.0 - 10.5 K/uL 13.2  12.9  10.2   Hemoglobin 13.0 - 17.0 g/dL 21.1  94.1  9.5   Hematocrit 39.0 - 52.0 % 34.0  34.1  30.9   Platelets 150 - 400 K/uL 437  389  332        Latest Ref Rng & Units 10/18/2022   12:15 AM 10/17/2022    4:04 AM 10/16/2022    5:45 AM  BMP  Glucose 70 - 99 mg/dL 740  814  481   BUN 6 - 20 mg/dL 28  20  18    Creatinine 0.61 - 1.24 mg/dL  8.56  3.14   Sodium 135 - 145 mmol/L 133  135  135   Potassium 3.5 - 5.1 mmol/L 4.6  3.7  3.3   Chloride 98 - 111 mmol/L 96  94  94   CO2 22 - 32 mmol/L 29  30  29    Calcium 8.9 - 10.3 mg/dL 8.9  9.2  9.1     BNP    Component Value Date/Time   BNP 7.1 02/15/2015 1500    ProBNP No results found for: "PROBNP"  PFT    Component Value Date/Time   FEV1PRE 0.90 10/17/2022 1656   FVCPRE 0.91 10/17/2022 1656   TLC 2.05 10/17/2022 1656   PREFEV1FVCRT 98  10/17/2022 1656    DG Chest 2 View  Result Date: 10/29/2022 CLINICAL DATA:  Shortness of breath EXAM: CHEST - 2 VIEW COMPARISON:  Previous studies including the examination of 10/18/2022 FINDINGS: Cardiac size is within normal limits. There are no signs of alveolar pulmonary edema. There are linear densities in left mid and both lower lung fields. There is elevation of left hemidiaphragm. Left lateral CP angle is indistinct. There is no pneumothorax. Metallic sutures are seen in sternum. No significant interval changes are noted in the lung fields. IMPRESSION:  There are linear densities in left mid and both lower lung fields suggesting scarring and subsegmental atelectasis. Left hemidiaphragm is elevated. No significant interval changes are noted since 10/18/2022 Electronically Signed   By: Ernie Avena M.D.   On: 10/29/2022 12:59   DG Chest 2 View  Result Date: 10/18/2022 CLINICAL DATA:  Pleural effusion.  History CABG. EXAM: CHEST - 2 VIEW COMPARISON:  10/14/2022 FINDINGS: Sternotomy wires unchanged. Lungs are hypoinflated but slightly better aerated. Persistent mild elevation of the left hemidiaphragm. Likely small amount left pleural fluid/atelectasis. Possible minimal fluid/atelectasis right base. Cardiomediastinal silhouette and remainder of the exam is unchanged. IMPRESSION: Likely small amount of bilateral pleural fluid/basilar atelectasis. Persistent mild elevation left hemidiaphragm. Electronically Signed   By: Elberta Fortis M.D.   On: 10/18/2022 15:07   DG Chest Port 1 View  Result Date: 10/14/2022 CLINICAL DATA:  Recent mediastinal surgery, atelectasis, shortness of breath EXAM: PORTABLE CHEST 1 VIEW COMPARISON:  Portable exam 0512 hours compared to 10/13/2022 FINDINGS: Enlargement of cardiac silhouette and prominent mediastinum post median sternotomy, stable. Decreased lung volumes with bibasilar atelectasis. Trace LEFT pleural effusion. No pneumothorax or acute osseous findings.  IMPRESSION: Low lung volumes with bibasilar atelectasis and trace LEFT pleural effusion. Electronically Signed   By: Ulyses Southward M.D.   On: 10/14/2022 08:24   DG Chest Port 1 View  Result Date: 10/13/2022 CLINICAL DATA:  Atelectasis EXAM: PORTABLE CHEST 1 VIEW COMPARISON:  10/12/2022 FINDINGS: Stable heart size. Prior median sternotomy. Very low lung volumes with persistent streaky bibasilar opacities. Probable small pleural effusions. No pneumothorax. IMPRESSION: Very low lung volumes with persistent streaky bibasilar opacities, likely atelectasis. Electronically Signed   By: Duanne Guess D.O.   On: 10/13/2022 09:30   DG CHEST PORT 1 VIEW  Result Date: 10/12/2022 CLINICAL DATA:  62 year old male with Postoperative day 5 status post thoracoscopy for anterior mediastinal mass. Some of the left phrenic nerve was involved by mass and had to be resected. Left phrenic nerve palsy, edema. EXAM: PORTABLE CHEST 1 VIEW COMPARISON:  Portable chest 10/11/2022 and earlier. FINDINGS: Bilateral chest tubes now removed. Continued low lung volumes. No pneumothorax identified. Stable cardiac size and mediastinal contours. Stable streaky and platelike perihilar opacity and more confluent bibasilar opacity with some superimposed elevation of the left hemidiaphragm. Negative visible bowel gas. Stable sternotomy, osseous structures. IMPRESSION: 1. Bilateral chest tubes now removed with no pneumothorax identified. 2. Continued low lung volumes with atelectasis. Electronically Signed   By: Odessa Fleming M.D.   On: 10/12/2022 08:45   DG CHEST PORT 1 VIEW  Result Date: 10/11/2022 CLINICAL DATA:  62 year old male with shortness of breath. Postoperative day 4 status post thoracoscopy for anterior mediastinal mass. Some of the left phrenic nerve was involved by mass and had to be resected. Left phrenic nerve palsy, edema. 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 EXAM: PORTABLE  CHEST 1 VIEW COMPARISON:  Portable chest 10/10/2022 and earlier. FINDINGS: Portable AP semi upright view at 0510 hours. Bilateral chest tubes remain in place. Continued low lung volumes, fairly symmetric level of the diaphragms. Streaky perihilar and lung base opacity not significantly changed. No pneumothorax identified. No overt edema. Stable cardiac size and mediastinal contours. Stable sternotomy. Negative visible bowel gas pattern. No acute osseous abnormality identified. IMPRESSION: Stable bilateral chest tubes and low lung volumes. No pneumothorax or pulmonary edema identified. Electronically Signed   By: Odessa Fleming M.D.   On: 10/11/2022 07:59   DG CHEST PORT  1 VIEW  Result Date: 10/10/2022 CLINICAL DATA:  Chest tube present EXAM: PORTABLE CHEST 1 VIEW COMPARISON:  CXR 10/09/22 FINDINGS: Status post median sternotomy. Bilateral thoracostomy tubes in place. Low lung volumes. Cardiac and mediastinal contours are unchanged from prior exam. No new focal airspace opacity. Persistent prominent bilateral interstitial opacities. Small bilateral pleural effusions, increased on the left. No pneumothorax. Visualized upper abdomen is unremarkable. No new displaced rib fractures. IMPRESSION: 1. Bilateral thoracostomy tubes in place. No pneumothorax. 2. Small bilateral pleural effusions, increased on the left. Electronically Signed   By: Marin Roberts M.D.   On: 10/10/2022 08:35   DG CHEST PORT 1 VIEW  Result Date: 10/09/2022 CLINICAL DATA:  Chest tube present s/p mediastinal mass surg, sob EXAM: PORTABLE CHEST - 1 VIEW COMPARISON:  the previous day's study FINDINGS: Low lung volumes with perihilar and suprahilar airspace opacity slightly increased. Bilateral chest drains in place. No pneumothorax. The right lateral subcutaneous emphysema seen previously has resolved. Persistent blunting of the right lateral costophrenic angle. Heart size normal. Sternotomy wires. IMPRESSION: Low volumes with increasing perihilar  and suprahilar airspace disease. Electronically Signed   By: Lucrezia Europe M.D.   On: 10/09/2022 06:48   DG Chest Port 1 View  Result Date: 10/08/2022 CLINICAL DATA:  Status post surgery for mediastinal mass EXAM: PORTABLE CHEST 1 VIEW COMPARISON:  Previous studies including the examination of 10/07/2022 FINDINGS: There is interval removal of endotracheal tube. Chest tubes are noted on both sides. There is poor inspiration. Increased markings are seen in left lower lung field. There are no signs of alveolar pulmonary edema. There is no significant pleural effusion or pneumothorax. Subcutaneous emphysema is seen in chest wall, more so on the right side. Metallic sutures are seen in sternum. IMPRESSION: Increased density in left lower lung fields may suggest atelectasis. Poor inspiration. There is no significant pleural effusion or pneumothorax. Electronically Signed   By: Elmer Picker M.D.   On: 10/08/2022 08:19   DG Chest Port 1 View  Result Date: 10/07/2022 CLINICAL DATA:  Pneumothorax. EXAM: PORTABLE CHEST 1 VIEW COMPARISON:  10/03/2022 and CT chest 09/27/2022. FINDINGS: Endotracheal tube terminates approximately 3 cm above the carina. Heart size is accentuated by AP semi upright low volume technique. Bilateral chest tubes in place. No definite pneumothorax. Central pulmonary vascular congestion. Mild bibasilar atelectasis. Subcutaneous emphysema along the chest wall bilaterally. IMPRESSION: 1. No definite pneumothorax with bilateral chest tubes in place. 2. Low lung volumes with central pulmonary vascular congestion and bibasilar atelectasis. Electronically Signed   By: Lorin Picket M.D.   On: 10/07/2022 15:18   DG Chest 2 View  Result Date: 10/04/2022 CLINICAL DATA:  Preoperative chest exam.  Mediastinal mass. EXAM: CHEST - 2 VIEW COMPARISON:  None Available. FINDINGS: The heart size and mediastinal contours are stable. No consolidation, effusion, or pneumothorax. Degenerative changes are  present in the thoracic spine. IMPRESSION: No active cardiopulmonary disease. Electronically Signed   By: Brett Fairy M.D.   On: 10/04/2022 22:37     Past medical hx Past Medical History:  Diagnosis Date   Diabetes ([place])    Diet controlled   Hyperlipidemia      Social History   Tobacco Use   Smoking status: Never   Smokeless tobacco: Never   Tobacco comments:    both parents smoked in home growing up.   Vaping Use   Vaping Use: Never used  Substance Use Topics   Alcohol use: No    Alcohol/week: 0.0 standard drinks  of alcohol   Drug use: No    Mr.Hanke reports that he has never smoked. He has never used smokeless tobacco. He reports that he does not drink alcohol and does not use drugs.  Tobacco Cessation: Past surgical hx, Family hx, Social hx all reviewed. Never smoker but second hand smoke from 2 parents while growing up.   Current Outpatient Medications on File Prior to Visit  Medication Sig   acetaminophen (TYLENOL) 500 MG tablet Take 1,000 mg by mouth every 6 (six) hours as needed for moderate pain.   amiodarone (PACERONE) 200 MG tablet Take 1 tablet (200 mg total) by mouth 2 (two) times daily.   aspirin EC 325 MG tablet Take 1 tablet (325 mg total) by mouth daily.   cetirizine (ZYRTEC) 10 MG tablet Take 10 mg by mouth daily as needed for allergies.   docusate sodium (COLACE) 100 MG capsule Take 100 mg by mouth 2 (two) times daily.   ibuprofen (ADVIL) 200 MG tablet Take 600 mg by mouth 3 (three) times daily.   lisinopril (ZESTRIL) 2.5 MG tablet Take 1 tablet (2.5 mg total) by mouth daily.   metFORMIN (GLUCOPHAGE-XR) 500 MG 24 hr tablet Take 500 mg by mouth every morning.   metoprolol succinate (TOPROL-XL) 25 MG 24 hr tablet Take 3 tablets (75 mg total) by mouth daily.   oxyCODONE (OXY IR/ROXICODONE) 5 MG immediate release tablet Take 1 tablet (5 mg total) by mouth every 6 (six) hours as needed for up to 7 days for severe pain.   rosuvastatin (CRESTOR) 10 MG  tablet Take 1 tablet (10 mg total) by mouth daily.   tamsulosin (FLOMAX) 0.4 MG CAPS capsule Take 1 capsule (0.4 mg total) by mouth daily.   Spacer/Aero-Holding Chambers (AEROCHAMBER PLUS FLO-VU LARGE) MISC 1 each by Other route once. (Patient not taking: Reported on 10/29/2022)   No current facility-administered medications on file prior to visit.     No Known Allergies  Review Of Systems:  Constitutional:   No  weight loss, night sweats,  Fevers, chills, + fatigue, or  lassitude.  HEENT:   No headaches,  Difficulty swallowing,  Tooth/dental problems, or  Sore throat,                No sneezing, itching, ear ache, nasal congestion, post nasal drip,   CV:  +  chest incisional discomfort,  No Orthopnea, PND,++ swelling in lower extremities, No anasarca, dizziness, palpitations, syncope.   GI  No heartburn, indigestion, abdominal pain, nausea, vomiting, diarrhea, change in bowel habits, loss of appetite, bloody stools.   Resp: + shortness of breath with exertion less at rest.  No excess mucus, no productive cough,  No non-productive cough,  No coughing up of blood.  No change in color of mucus.  No wheezing.  No chest wall deformity  Skin: no rash or lesions.  GU: no dysuria, change in color of urine, no urgency or frequency.  No flank pain, no hematuria   MS:  No joint pain or swelling.  + decreased range of motion.  No back pain.  Psych:  No change in mood or affect. No depression + anxiety.  No memory loss.   Vital Signs BP 122/68 (BP Location: Right Arm, Patient Position: Sitting, Cuff Size: Large)   Pulse 82   Temp 98.2 F (36.8 C) (Oral)   Ht 5\' 9"  (1.753 m)   Wt 282 lb 12.8 oz (128.3 kg)   SpO2 96%   BMI 41.76 kg/m  Physical Exam:  General- No distress,  A&O x 3, pleasant ENT: No sinus tenderness, TM clear, pale nasal mucosa, no oral exudate,no post nasal drip, no LAN Cardiac: S1, S2, regular rate and rhythm, no murmur Chest: No wheeze/ rales/ dullness; no  accessory muscle use, no nasal flaring, no sternal retractions, BS diminished per bases.  Abd.: Soft Non-tender,ND,  BS +, Body mass index is 41.76 kg/m.  Ext: No clubbing cyanosis, + 2 + BLE edema, 1+ BUE edema Neuro:  Physical deconditioning , MAE x 4, A&O x 3, appropriate  Skin: No rashes, warm and dry, stenral incision healing, no drainage or redness. Psych: Anxious   Assessment/Plan SP resection of mediastinal mass and sternotomy  10/07/2022.  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 He is currently sleeping sitting up.  Plan CXR now We will order a heart echo to evaluate heart function.  I will call you with these results.  Continue wearing BiPAP as you have been doing.  We will send in an albuterol inhaler  Use 1-2 puffs as needed for shortness of breath or wheezing, no more than 4 times daily.  Oxygen saturations should be greater than 88% at all times.  Follow up with Dr. Cliffton Asters 11/01/2022. As is scheduled. Follow up with Dr. Uvaldo Rising 11/04/2022 as is scheduled.  We will refer to pulmonary rehab.  Will need down Load at follow up to evaluate effectiveness of BiPAP Please contact office for sooner follow up if symptoms do not improve or worsen or seek emergency care    Discussion Mr. Siller has stable vital signs but endorses increased work of breathing, CXR is without acute issues. There is notation of atelectasis and scarring. Sats are 98%. He is using his BiPAP appropriately and states he feels good when wearing it. I have told him to use this with sleep at bedtime and when he naps. I have told him to call or seek emergency care for sleepiness that does not get better after BiPAP.  I have ordered an echo to check cardiac function. He does have some BLE edema, no edema on  CXR . He has been referred to Pulmonary rehab. I have asked him to use IS every hour while awake at home to decrease atelectasis noted on CXR today. Marland Kitchen He needs continued physical therapy, as he has physical deconditioning.  He has follow up with [facility] this week, and his PCP next week.  I have asked him to call if he needs [place] before the 3 week follow up.   I spent 50 minutes dedicated to the care of this patient on the date of this encounter to include pre-visit review of records, face-to-face time with the patient discussing conditions above, post visit ordering of testing, clinical documentation with the electronic health record, making appropriate referrals as documented, and communicating necessary information to the patient's healthcare team.    Bevelyn Ngo, NP 10/29/2022  6:27 PM

## 2022-10-30 ENCOUNTER — Other Ambulatory Visit (HOSPITAL_COMMUNITY): Payer: Self-pay

## 2022-10-30 NOTE — Progress Notes (Signed)
Reviewed and agree with assessment/plan.   Chesley Mires, MD [facility] Pulmonary/Critical Care 10/30/2022, 9:37 AM Pager:  [phone]

## 2022-10-30 NOTE — Telephone Encounter (Signed)
Called patient and went over the recommendations from George Wallace and he states that he is unable to come in today but he can come in tomorrow for lab work. I told him he does not need appointment for labs he can come in and check in and tell front staff he is here for lab work. Placed orders for BMET and BNP as requested. Nothing further needed

## 2022-11-01 ENCOUNTER — Other Ambulatory Visit (INDEPENDENT_AMBULATORY_CARE_PROVIDER_SITE_OTHER): Payer: BC Managed Care – PPO

## 2022-11-01 ENCOUNTER — Telehealth: Payer: Self-pay | Admitting: Acute Care

## 2022-11-01 ENCOUNTER — Ambulatory Visit (INDEPENDENT_AMBULATORY_CARE_PROVIDER_SITE_OTHER): Payer: Self-pay | Admitting: Thoracic Surgery (Cardiothoracic Vascular Surgery)

## 2022-11-01 VITALS — BP 144/91 | HR 68 | Resp 20 | Ht 69.0 in | Wt 282.0 lb

## 2022-11-01 DIAGNOSIS — J984 Other disorders of lung: Secondary | ICD-10-CM

## 2022-11-01 DIAGNOSIS — J9859 Other diseases of mediastinum, not elsewhere classified: Secondary | ICD-10-CM

## 2022-11-01 LAB — BRAIN NATRIURETIC PEPTIDE: Pro B Natriuretic peptide (BNP): 9 pg/mL (ref 0.0–100.0)

## 2022-11-01 LAB — BASIC METABOLIC PANEL
BUN: 14 mg/dL (ref 6–23)
CO2: 30 mEq/L (ref 19–32)
Calcium: 9.1 mg/dL (ref 8.4–10.5)
Chloride: 103 mEq/L (ref 96–112)
Creatinine, Ser: 0.94 mg/dL (ref 0.40–1.50)
GFR: 88.59 mL/min (ref >60.00)
Glucose, Bld: 116 mg/dL — ABNORMAL HIGH (ref 70–99)
Potassium: 4.5 mEq/L (ref 3.5–5.1)
Sodium: 139 mEq/L (ref 135–145)

## 2022-11-01 MED ORDER — OXYCODONE HCL 5 MG PO TABS: 5.0000 mg | ORAL_TABLET | ORAL | 0 refills | Status: DC | PRN

## 2022-11-01 NOTE — Telephone Encounter (Signed)
I have called the patient with the results of his lab work. Hius BNP was normal and his BMET was normal with the exception of his blood glucose. This was not a fasting blood test . He has verbalized understanding of the above. He saw Dr. Kipp Brood today in the office and will see him again in a month with a CXR and they will discuss diaphragm plication at that time.

## 2022-11-01 NOTE — Progress Notes (Signed)
      [address]       [place], [address]             [phone]        Param Mangal [place] Medical Record #[id number] Date of Birth: 19-Oct-1961  Referring: Cari Caraway, MD Primary Care: Cari Caraway, MD Primary Cardiologist:None  Reason for visit:   follow-up  History of Present Illness:     62 year old male presents for his first follow-up appointment.  Overall he is doing well.  He is ambulating without oxygen.  He does occasionally use the BiPAP when driving in his car, and when going to bed.  Physical Exam: BP (!) 144/91 (BP Location: Right Arm, Patient Position: Sitting)   Pulse 68   Resp 20   Ht 5\' 9"  (1.753 m)   Wt 282 lb (127.9 kg)   SpO2 98% Comment: RA  BMI 41.64 kg/m   Alert NAD Incision clean.  Sternum stable Abdomen, ND Extremities peripheral edema   Diagnostic Studies & Laboratory data: CXR: Clear Path: FINAL MICROSCOPIC DIAGNOSIS:   A. 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  Lymph nodes with reactive lymphoid hyperplasia.  Negative for malignancy.     DIAGNOSIS:   Mediastinal mass, flow cytometry:  -  No immunophenotypic evidence of a lymphoproliferative disorder (no  monoclonal B cells or immunophenotypically abnormal T cells detected).   Assessment / Plan:   62 year old male status post mediastinal mass resection.  The lesion was benign.  He also has an elevated left hemidiaphragm, but is ambulating without much shortness of breath.  I will see him back at the end of the month with another chest x-ray.  We will discuss options for plicating his left hemidiaphragm.  He has stated that he may want to return to work prior to the diaphragm plication given that his wife also keeps surgery in the spring.   Lajuana Matte 11/01/2022 10:06 AM

## 2022-11-04 ENCOUNTER — Other Ambulatory Visit (HOSPITAL_COMMUNITY): Payer: Self-pay

## 2022-11-04 DIAGNOSIS — J986 Disorders of diaphragm: Secondary | ICD-10-CM | POA: Diagnosis not present

## 2022-11-04 DIAGNOSIS — Z8709 Personal history of other diseases of the respiratory system: Secondary | ICD-10-CM | POA: Diagnosis not present

## 2022-11-04 DIAGNOSIS — G4733 Obstructive sleep apnea (adult) (pediatric): Secondary | ICD-10-CM | POA: Diagnosis not present

## 2022-11-04 DIAGNOSIS — J984 Other disorders of lung: Secondary | ICD-10-CM | POA: Diagnosis not present

## 2022-11-05 ENCOUNTER — Telehealth: Payer: Self-pay | Admitting: Acute Care

## 2022-11-05 DIAGNOSIS — J984 Other disorders of lung: Secondary | ICD-10-CM

## 2022-11-06 NOTE — Telephone Encounter (Signed)
Referral placed for OT. Placed under Dr Melvyn Novas because I'm not sure if it will go through under an NP. Order placed and called and left message for patient to update him. Nothing further needed

## 2022-11-06 NOTE — Telephone Encounter (Signed)
Called patient and he is wanting update with:    Pulmonary rehab referral (In process) Echocardiogram(ordered)  Occupational therapy referral(???)  Judson Roch I did not see in your notes about occupational therapy. Are you ok with the is order for this patient  Please let me know   Thank you

## 2022-11-07 ENCOUNTER — Other Ambulatory Visit (HOSPITAL_COMMUNITY): Payer: Self-pay

## 2022-11-07 NOTE — Telephone Encounter (Signed)
Called and left patient a voicemail for him to call Bridget Westbrooks back at the office to go over occupational therapy and pulmonary rehab. I need to make sure he is not getting them confused with one another and what he is wanting ordered for him.

## 2022-11-08 ENCOUNTER — Other Ambulatory Visit (HOSPITAL_BASED_OUTPATIENT_CLINIC_OR_DEPARTMENT_OTHER): Payer: Self-pay

## 2022-11-08 ENCOUNTER — Other Ambulatory Visit (HOSPITAL_COMMUNITY): Payer: Self-pay

## 2022-11-08 MED ORDER — TAMSULOSIN HCL 0.4 MG PO CAPS
0.4000 mg | ORAL_CAPSULE | Freq: Every day | ORAL | 2 refills | Status: DC
  Filled 2022-11-08 – 2022-11-20 (×2): qty 30, 30d supply, fill #0

## 2022-11-09 ENCOUNTER — Other Ambulatory Visit (HOSPITAL_COMMUNITY): Payer: Self-pay

## 2022-11-11 ENCOUNTER — Other Ambulatory Visit (HOSPITAL_COMMUNITY): Payer: Self-pay

## 2022-11-11 ENCOUNTER — Other Ambulatory Visit: Payer: Self-pay | Admitting: Thoracic Surgery (Cardiothoracic Vascular Surgery)

## 2022-11-11 ENCOUNTER — Telehealth: Payer: Self-pay

## 2022-11-11 MED ORDER — OXYCODONE HCL 5 MG PO TABS
5.0000 mg | ORAL_TABLET | ORAL | 0 refills | Status: DC | PRN
  Filled 2022-11-11: qty 30, 7d supply, fill #0

## 2022-11-11 MED ORDER — METOPROLOL SUCCINATE ER 25 MG PO TB24
75.0000 mg | ORAL_TABLET | Freq: Every day | ORAL | 1 refills | Status: DC
  Filled 2022-11-11: qty 90, 30d supply, fill #0

## 2022-11-11 NOTE — Telephone Encounter (Signed)
Spoke with pt and explained the difference between occupational therapy and pulmonary rehab. Pt states he is interested in both therapies. Pt was also asking when his echo would be scheduled. PCCs could you please advise on this?   Pt was also asking about a Cardiology referral. I did not see this listed in the office notes. Judson Roch would you like [place] to order this?  Pt would like referral placed with Dr. Gwyndolyn Kaufman because provider is in pt's network.

## 2022-11-11 NOTE — Telephone Encounter (Signed)
RX for Oxycodone refilled by Dr Kipp Brood today

## 2022-11-11 NOTE — Telephone Encounter (Signed)
Patient is returning phone call. Patient phone number is [phone].

## 2022-11-12 ENCOUNTER — Other Ambulatory Visit: Payer: Self-pay | Admitting: Surgical

## 2022-11-12 NOTE — Telephone Encounter (Signed)
The order for occupational therapy and pulmonary rehab are both in. George Wallace was just waiting. Just now placed cardiology referral as patient requested. Called and updated patient that all of his referrals he requested are being placed and that for his echo one of the PCCs will be in touch with getting that scheduled with him. Nothing further needed

## 2022-11-12 NOTE — Addendum Note (Signed)
Addended by: Monna Fam L on: 11/12/2022 09:11 AM   Modules accepted: Orders

## 2022-11-13 ENCOUNTER — Other Ambulatory Visit: Payer: Self-pay | Admitting: Surgical

## 2022-11-15 ENCOUNTER — Ambulatory Visit: Payer: Self-pay | Admitting: Physical Therapy

## 2022-11-15 NOTE — Telephone Encounter (Signed)
Order was placed 1/2 for pt to have an echo performed. This is to be done prior to pt's upcoming appt with SG on 1/26. PCCS, can you help [place] out with this please.

## 2022-11-18 DIAGNOSIS — J9600 Acute respiratory failure, unspecified whether with hypoxia or hypercapnia: Secondary | ICD-10-CM | POA: Diagnosis not present

## 2022-11-18 DIAGNOSIS — E119 Type 2 diabetes mellitus without complications: Secondary | ICD-10-CM | POA: Diagnosis not present

## 2022-11-18 DIAGNOSIS — I700 Atherosclerosis of aorta: Secondary | ICD-10-CM | POA: Diagnosis not present

## 2022-11-18 DIAGNOSIS — J986 Disorders of diaphragm: Secondary | ICD-10-CM | POA: Diagnosis not present

## 2022-11-20 ENCOUNTER — Other Ambulatory Visit: Payer: Self-pay | Admitting: Thoracic Surgery (Cardiothoracic Vascular Surgery)

## 2022-11-20 ENCOUNTER — Ambulatory Visit (HOSPITAL_BASED_OUTPATIENT_CLINIC_OR_DEPARTMENT_OTHER)
Admission: RE | Admit: 2022-11-20 | Discharge: 2022-11-20 | Disposition: A | Discharge status: Home / Self Care | Payer: BC Managed Care – PPO | Source: Ambulatory Visit | Attending: Acute Care | Admitting: Acute Care

## 2022-11-20 ENCOUNTER — Other Ambulatory Visit (HOSPITAL_COMMUNITY): Payer: Self-pay

## 2022-11-20 DIAGNOSIS — R0602 Shortness of breath: Secondary | ICD-10-CM | POA: Diagnosis not present

## 2022-11-20 MED ORDER — PERFLUTREN LIPID MICROSPHERE
1.0000 mL | INTRAVENOUS | Status: AC | PRN
  Administered 2022-11-20: 10 mL via INTRAVENOUS

## 2022-11-21 ENCOUNTER — Other Ambulatory Visit (HOSPITAL_COMMUNITY): Payer: Self-pay

## 2022-11-21 LAB — ECHOCARDIOGRAM COMPLETE
Area-P 1/2: 3.37 cm2
S' Lateral: 3.4 cm

## 2022-11-21 MED ORDER — TAMSULOSIN HCL 0.4 MG PO CAPS
0.4000 mg | ORAL_CAPSULE | Freq: Every day | ORAL | 1 refills | Status: DC
  Filled 2022-11-21: qty 30, 30d supply, fill #0

## 2022-11-21 MED ORDER — LISINOPRIL 2.5 MG PO TABS
2.5000 mg | ORAL_TABLET | Freq: Every day | ORAL | 1 refills | Status: DC
  Filled 2022-11-21: qty 30, 30d supply, fill #0

## 2022-11-21 MED ORDER — ROSUVASTATIN CALCIUM 10 MG PO TABS
10.0000 mg | ORAL_TABLET | Freq: Every day | ORAL | 0 refills | Status: DC
  Filled 2022-11-21: qty 30, 30d supply, fill #0

## 2022-11-22 ENCOUNTER — Ambulatory Visit (INDEPENDENT_AMBULATORY_CARE_PROVIDER_SITE_OTHER): Payer: BC Managed Care – PPO | Admitting: Acute Care

## 2022-11-22 ENCOUNTER — Telehealth (HOSPITAL_COMMUNITY): Payer: Self-pay

## 2022-11-22 ENCOUNTER — Other Ambulatory Visit: Payer: Self-pay | Admitting: *Deleted

## 2022-11-22 ENCOUNTER — Encounter: Payer: Self-pay | Admitting: Acute Care

## 2022-11-22 VITALS — BP 118/70 | HR 68 | Temp 98.2°F | Ht 69.0 in | Wt 289.0 lb

## 2022-11-22 DIAGNOSIS — J9859 Other diseases of mediastinum, not elsewhere classified: Secondary | ICD-10-CM

## 2022-11-22 DIAGNOSIS — J9612 Chronic respiratory failure with hypercapnia: Secondary | ICD-10-CM

## 2022-11-22 DIAGNOSIS — J9610 Chronic respiratory failure, unspecified whether with hypoxia or hypercapnia: Secondary | ICD-10-CM | POA: Diagnosis not present

## 2022-11-22 DIAGNOSIS — J984 Other disorders of lung: Secondary | ICD-10-CM | POA: Diagnosis not present

## 2022-11-22 DIAGNOSIS — J986 Disorders of diaphragm: Secondary | ICD-10-CM

## 2022-11-22 DIAGNOSIS — Z9989 Dependence on other enabling machines and devices: Secondary | ICD-10-CM

## 2022-11-22 MED ORDER — TRAMADOL HCL 50 MG PO TABS: 50.0000 mg | ORAL_TABLET | Freq: Four times a day (QID) | ORAL | 0 refills | Status: DC | PRN

## 2022-11-22 NOTE — Progress Notes (Addendum)
History of Present Illness George Wallace is a 62 y.o. 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 It was taken en bloc. Required sternotomy to anatomy at the vein with cessation of bleeding. He requires BiPap therapy to prevent CO2 narcosis due to diaphragm weakness. He was followed by Pulmonary critical care as an inpatient for high risk of CO2 narcosis as a results of left phrenic nerve paralysis. He will be followed as an outpatient for BiPap management   Synopsis 62 y.o. 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  He was originally undergoing a CT scan for coronary calcium score, and a nodular mass was identified. PET/CT scan which showed avidity in the anterior mediastinal mass concerning for lymphoma .Biopsies at [facility] were nonconclusive. 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 Kipp Brood on 10/07/2022. Some of the left phrenic nerve was deeply invested into the tumor. It was taken en bloc . He has left hemidiaphragm elevation as a results of the left phrenic nerve removal. He was treated post op in the ICU , he was extubated the day of surgery, and placed on BiPAP. He had chest tubes with minimal output, they were removed 12/13. He was restarted on Lisinopril for his renal failure. When he was discharged from the hospital he was on [place] with BiPAP at night. He will need his BiPAP for life to prevent CO2 narcosis.    12/11 - s/p sternotomy and resection of mediastinal mass; post op intubated 12/12 extubated 12/13 BIPAP  intermittent 12/14 Afib on amio     11/22/2022 Pt. Presents for follow up. He looks much better than he did at his previous OV. He states he still has shortness of breath , especially accentuated when he sits in a low position.Marland Kitchen He does much better when he is sitting up high. He does still endorse lower extremity edema , and some incisional tightness.  Echo was ordered at the last appointment. Results are noted, and echo is normal.( See below)  He followed up with Dr. Kipp Brood. Plan is for plication of his left hemidiaphragm in the near future. This will hopefully improve his dyspnea. I have offered pulmonary rehab, and patient would like to be referred.  Pt. States he has been using his BiPAP nightly and with naps. He is on RA during the day with sats of 100%. He has not had to use his rescue inhaler at all. We were unable to get a down Load on his BiPAP as the website was down, and he is not enrolled in [employer]. We will enroll him and try to get a down Load to ensure effectiveness of treatment. .   Test Results: Echo 11/20/2022 1. Left ventricular ejection fraction, by estimation, is 60 to 65%. The  left ventricle has normal function. The left ventricle has no regional  wall motion abnormalities. Left ventricular diastolic parameters were  normal.   2. Right ventricular systolic function is normal. The right ventricular  size is normal.   3. The mitral valve is normal in structure. No evidence of mitral valve  regurgitation. No evidence of mitral stenosis.   4. The aortic valve is normal in structure. Aortic valve regurgitation is  not visualized. No aortic stenosis is present.   5. The inferior vena cava is normal in size with greater than 50%  respiratory variability, suggesting right atrial pressure of 3 mmHg.  Path: FINAL MICROSCOPIC DIAGNOSIS:   A. 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  Lymph nodes with reactive lymphoid  hyperplasia.  Negative for malignancy.      DIAGNOSIS:   Mediastinal mass, flow cytometry:  -  No immunophenotypic evidence of a lymphoproliferative disorder (no  monoclonal B cells or immunophenotypically abnormal T cells detected).       Latest Ref Rng & Units 10/16/2022    5:45 AM 10/15/2022    6:08 AM 10/14/2022    1:33 AM  CBC  WBC 4.0 - 10.5 K/uL 13.2  12.9  10.2   Hemoglobin 13.0 - 17.0 g/dL 86.5  78.4  9.5   Hematocrit 39.0 - 52.0 % 34.0  34.1  30.9   Platelets 150 - 400 K/uL 437  389  332        Latest Ref Rng & Units 11/01/2022    9:30 AM 10/18/2022   12:15 AM 10/17/2022    4:04 AM  BMP  Glucose 70 - 99 mg/dL 696  295  284   BUN 6 - 23 mg/dL 14  28  20    Creatinine 0.40 - 1.50 mg/dL  1.32  4.40   Sodium 135 - 145 mEq/L 139  133  135   Potassium 3.5 - 5.1 mEq/L 4.5  4.6  3.7   Chloride 96 - 112 mEq/L 103  96  94   CO2 19 - 32 mEq/L 30  29  30    Calcium 8.4 - 10.5 mg/dL 9.1  8.9  9.2     BNP    Component Value Date/Time   BNP 7.1 02/15/2015 1500    ProBNP    Component Value Date/Time   PROBNP 9.0 11/01/2022 0930    PFT    Component Value Date/Time   FEV1PRE 0.90 10/17/2022 1656   FVCPRE 0.91 10/17/2022 1656   TLC 2.05 10/17/2022 1656   PREFEV1FVCRT 98 10/17/2022 1656    ECHOCARDIOGRAM COMPLETE  Result Date: 11/21/2022    ECHOCARDIOGRAM REPORT   Patient Name:   George Wallace Date of Exam: 11/20/2022 Medical Rec #:  Ailene Ravel        Height:       69.0 in Accession #:    11/22/2022       Weight:       282.0 lb Date of Birth:  May 26, 1961        BSA:          2.391 m Patient Age:    62 years         BP:           144/91 mmHg Patient Gender: M                HR:           68 bpm. Exam Location:  [place] Procedure: 2D Echo, Cardiac Doppler, Color Doppler and Intracardiac            Opacification Agent Indications:    Shortness of breath [R06.02 (ICD-10-CM)]  History:        Patient has prior history of Echocardiogram examinations, most  recent 12/08/2014. Post mediastinal mass resection, 10/07/2022.  Sonographer:    Margreta Journey RDCS Referring Phys: [address] Sherryann Frese  Sonographer Comments: Technically difficult study due to poor echo windows. Image acquisition challenging due to patient body habitus. Echo performed with patient in Fowler's position. IMPRESSIONS  1. Left ventricular ejection fraction, by estimation, is 60 to 65%. The left ventricle has normal function. The left ventricle has no regional wall motion abnormalities. Left ventricular diastolic parameters were normal.  2. Right ventricular systolic function is normal. The right ventricular size is normal.  3. The mitral valve is normal in structure. No evidence of mitral valve regurgitation. No evidence of mitral stenosis.  4. The aortic valve is normal in structure. Aortic valve regurgitation is not visualized. No aortic stenosis is present.  5. The inferior vena cava is normal in size with greater than 50% respiratory variability, suggesting right atrial pressure of 3 mmHg. FINDINGS  Left Ventricle: Left ventricular ejection fraction, by estimation, is 60 to 65%. The left ventricle has normal function. The left ventricle has no regional wall motion abnormalities. Definity contrast agent was given IV to delineate the left ventricular  endocardial borders. The left ventricular internal cavity size was normal in size. There is borderline left ventricular hypertrophy. Left ventricular diastolic parameters were normal. Right Ventricle: The right ventricular size is normal. No increase in right ventricular wall thickness. Right ventricular systolic function is normal. Left Atrium: Left atrial size was normal in size. Right Atrium: Right atrial size was normal in size. Pericardium: There is no evidence of pericardial effusion. Mitral Valve: The mitral valve is normal in structure. No evidence of mitral valve regurgitation. No evidence of mitral valve stenosis. Tricuspid Valve: The  tricuspid valve is normal in structure. Tricuspid valve regurgitation is not demonstrated. No evidence of tricuspid stenosis. Aortic Valve: The aortic valve is normal in structure. Aortic valve regurgitation is not visualized. No aortic stenosis is present. Pulmonic Valve: The pulmonic valve was normal in structure. Pulmonic valve regurgitation is not visualized. No evidence of pulmonic stenosis. Aorta: The aortic root is normal in size and structure. Venous: The inferior vena cava is normal in size with greater than 50% respiratory variability, suggesting right atrial pressure of 3 mmHg. IAS/Shunts: No atrial level shunt detected by color flow Doppler.  LEFT VENTRICLE PLAX 2D LVIDd:         4.00 cm   Diastology LVIDs:         3.40 cm   LV e' medial:    8.81 cm/s LV PW:         1.10 cm   LV E/e' medial:  11.3 LV IVS:        1.10 cm   LV e' lateral:   13.50 cm/s LVOT diam:     2.40 cm   LV E/e' lateral: 7.4 LV SV:         107 LV SV Index:   45 LVOT Area:     4.52 cm  RIGHT VENTRICLE          IVC RV Basal diam:  2.60 cm  IVC diam: 2.80 cm LEFT ATRIUM           Index        RIGHT ATRIUM           Index LA diam:      3.00 cm 1.25 cm/m   RA Area:     13.30 cm LA Vol (A4C): 65.8 ml 27.52 ml/m  RA Volume:  30.80 ml  12.88 ml/m  AORTIC VALVE LVOT Vmax:   108.00 cm/s LVOT Vmean:  72.400 cm/s LVOT VTI:    0.236 m  AORTA Ao Root diam: 3.30 cm Ao Asc diam:  3.30 cm Ao Desc diam: 2.30 cm MITRAL VALVE MV Area (PHT): 3.37 cm    SHUNTS MV Decel Time: 225 msec    Systemic VTI:  0.24 m MV E velocity: 99.50 cm/s  Systemic Diam: 2.40 cm MV A velocity: 60.70 cm/s MV E/A ratio:  1.64 Jenne Campus MD Electronically signed by Jenne Campus MD Signature Date/Time: 11/21/2022/12:52:02 PM    Final    DG Chest 2 View  Result Date: 10/29/2022 CLINICAL DATA:  Shortness of breath EXAM: CHEST - 2 VIEW COMPARISON:  Previous studies including the examination of 10/18/2022 FINDINGS: Cardiac size is within normal limits. There are no  signs of alveolar pulmonary edema. There are linear densities in left mid and both lower lung fields. There is elevation of left hemidiaphragm. Left lateral CP angle is indistinct. There is no pneumothorax. Metallic sutures are seen in sternum. No significant interval changes are noted in the lung fields. IMPRESSION: There are linear densities in left mid and both lower lung fields suggesting scarring and subsegmental atelectasis. Left hemidiaphragm is elevated. No significant interval changes are noted since 10/18/2022 Electronically Signed   By: Elmer Picker M.D.   On: 10/29/2022 12:59     Past medical hx Past Medical History:  Diagnosis Date   Diabetes ([place])    Diet controlled   Hyperlipidemia      Social History   Tobacco Use   Smoking status: Never   Smokeless tobacco: Never   Tobacco comments:    both parents smoked in home growing up.   Vaping Use   Vaping Use: Never used  Substance Use Topics   Alcohol use: No    Alcohol/week: 0.0 standard drinks of alcohol   Drug use: No    Mr.Slee reports that he has never smoked. He has never used smokeless tobacco. He reports that he does not drink alcohol and does not use drugs.  Tobacco Cessation: Never smoker Past surgical hx, Family hx, Social hx all reviewed.  Current Outpatient Medications on File Prior to Visit  Medication Sig   acetaminophen (TYLENOL) 500 MG tablet Take 1,000 mg by mouth every 6 (six) hours as needed for moderate pain.   albuterol (VENTOLIN HFA) 108 (90 Base) MCG/ACT inhaler Inhale 2 puffs into the lungs every 4 (four) hours as needed for wheezing.   amiodarone (PACERONE) 200 MG tablet Take 1 tablet (200 mg total) by mouth 2 (two) times daily.   aspirin EC 325 MG tablet Take 1 tablet (325 mg total) by mouth daily.   cetirizine (ZYRTEC) 10 MG tablet Take 10 mg by mouth daily as needed for allergies.   docusate sodium (COLACE) 100 MG capsule Take 100 mg by mouth 2 (two) times daily.   ibuprofen  (ADVIL) 200 MG tablet Take 600 mg by mouth 3 (three) times daily.   lisinopril (ZESTRIL) 2.5 MG tablet Take 1 tablet (2.5 mg total) by mouth daily.   lisinopril (ZESTRIL) 2.5 MG tablet Take 1 tablet (2.5 mg total) by mouth daily.   metFORMIN (GLUCOPHAGE-XR) 500 MG 24 hr tablet Take 500 mg by mouth every morning.   metoprolol succinate (TOPROL-XL) 25 MG 24 hr tablet Take 3 tablets (75 mg total) by mouth daily.   rosuvastatin (CRESTOR) 10 MG tablet Take 1 tablet (10 mg total) by  mouth daily.   Spacer/Aero-Holding Chambers (AEROCHAMBER PLUS FLO-VU LARGE) MISC 1 each by Other route once.   tamsulosin (FLOMAX) 0.4 MG CAPS capsule Take 1 capsule (0.4 mg total) by mouth daily.   tamsulosin (FLOMAX) 0.4 MG CAPS capsule Take 1 capsule (0.4 mg total) by mouth daily.   tamsulosin (FLOMAX) 0.4 MG CAPS capsule Take 1 capsule (0.4 mg total) by mouth daily.   No current facility-administered medications on file prior to visit.     No Known Allergies  Review Of Systems:  Constitutional:   No  weight loss, night sweats,  Fevers, chills,+ fatigue, or  lassitude.  HEENT:   No headaches,  Difficulty swallowing,  Tooth/dental problems, or  Sore throat,                No sneezing, itching, ear ache, nasal congestion, post nasal drip,   CV:  No chest pain, + incisional tightness, No Orthopnea, PND, +swelling in lower extremities, No anasarca, dizziness, palpitations, syncope.   GI  No heartburn, indigestion, abdominal pain, nausea, vomiting, diarrhea, change in bowel habits, loss of appetite, bloody stools.   Resp: + shortness of breath with exertion less at rest.  No excess mucus, no productive cough,  No non-productive cough,  No coughing up of blood.  No change in color of mucus.  No wheezing.  No chest wall deformity  Skin: no rash or lesions.Incision is approximated and healing  GU: no dysuria, change in color of urine, no urgency or frequency.  No flank pain, no hematuria   MS:  No joint pain or  swelling.  No decreased range of motion.  No back pain.  Psych:  No change in mood or affect. No depression or anxiety.  No memory loss.   Vital Signs BP 118/70   Pulse 68   Temp 98.2 F (36.8 C) (Oral)   Ht 5\' 9"  (1.753 m)   Wt 289 lb (131.1 kg)   SpO2 100%   BMI 42.68 kg/m    Physical Exam:  General- No distress,  A&Ox3, ENT: No sinus tenderness, TM clear, pale nasal mucosa, no oral exudate,no post nasal drip, no LAN Cardiac: S1, S2, regular rate and rhythm, no murmur Chest: No wheeze/ rales/ dullness; no accessory muscle use, no nasal flaring, no sternal retractions, diminished per L base Abd.: Soft Non-tender, ND, BS +, Body mass index is 42.68 kg/m.  Ext: No clubbing cyanosis, + LE edema Neuro:  Physical deconditioning, MAE x 4, A & O x 3 Skin: No rashes, warm and dry, healing surgical incision  Psych: normal mood and behavior   Assessment/Plan SP resection of mediastinal mass and sternotomy  10/07/2022.  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  You appear to be benefiting from the treatment  Goal is to wear for at least 6 hours each night for maximal clinical benefit. Continue to work on weight loss, as the link between excess weight  and sleep apnea is well established.   Remember to establish a good bedtime routine, and work on sleep hygiene.  Limit daytime naps , avoid stimulants such as caffeine and nicotine close to bedtime, exercise daily to promote sleep quality, avoid heavy , spicy, fried , or rich foods before bed. Ensure adequate exposure  to natural light during the day,establish a relaxing bedtime routine with a pleasant sleep environment ( Bedroom between 60 and 67  degrees, turn off bright lights , TV or device screens screens , consider black out curtains or white noise machines) Do not drive if sleepy. Remember to clean mask, tubing, filter, and reservoir once weekly with soapy water.  We were unable to get a down Load today as the website was down.  We will send in an albuterol inhaler  Use 1-2 puffs as needed for shortness of breath or wheezing, no more than 4 times daily.  Oxygen saturations should be greater than 88% at all times.  Follow up with Dr. Cliffton Asters 11/2022 as is scheduled to discuss plication of his left hemidiaphragm Follow up with Dr. Uvaldo Rising  as is scheduled.  We will refer to pulmonary rehab. >> We will call again to ensure they have received the referral.  Will need down Load at follow up to evaluate effectiveness of BiPAP We will call your DME ( [employer]) and have you enrolled in [employer]. Follow up in 2 ,months. Please contact office for sooner follow up if symptoms do not improve or worsen or seek emergency care     Discussion Patient is distressed by the number of medications he is now taking since hospital admission   He feels the new medications he was prescribed  are contributing to his lower extremity edema.  I have asked him to discuss adjustment of medications with his cardiologist as the medications he is most concerned about are  his beta-blocker and his amiodarone for his atrial fibrillation.  I spent 35 minutes dedicated to the care of this patient on the date of this encounter to include pre-visit review of records, face-to-face time with the patient discussing conditions above, post visit ordering of testing, clinical documentation with the electronic health record, making appropriate referrals as documented, and communicating necessary information to the patient's healthcare team.    Bevelyn Ngo, NP 11/22/2022  7:39 PM

## 2022-11-22 NOTE — Progress Notes (Unsigned)
Patient contacted the office requesting refill of oxycodone. Per patient, he is taking medication every 4 hours. Patient also taking tylenol and ibuprofen for pain. Patient describes pain as pressure. Denies redness, drainage or fevers. States lower aspect of sternal incision is swollen but that is not a new finding. Per Dr. Kipp Brood, patient provided rx for tramadol. Called into [facility] outpatient pharmacy. Patient aware.

## 2022-11-22 NOTE — Telephone Encounter (Signed)
Called patient to see if he was interested in participating in the Pulmonary Rehab Program. Patient stated yes. Patient will come in for orientation on 12/09/22 @ 9AM and will attend the 10:15AM exercise class. Went over insurance, patient verbalized understanding.   Tourist information centre manager.

## 2022-11-22 NOTE — Patient Instructions (Addendum)
It is good to see you today. Continue wearing BiPAP as you have been doing.  You appear to be benefiting from the treatment  Goal is to wear for at least 6 hours each night for maximal clinical benefit. Continue to work on weight loss, as the link between excess weight  and sleep apnea is well established.   Remember to establish a good bedtime routine, and work on sleep hygiene.  Limit daytime naps , avoid stimulants such as caffeine and nicotine close to bedtime, exercise daily to promote sleep quality, avoid heavy , spicy, fried , or rich foods before bed. Ensure adequate exposure to natural light during the day,establish a relaxing bedtime routine with a pleasant sleep environment ( Bedroom between 60 and 67 degrees, turn off bright lights , TV or device screens screens , consider black out curtains or white noise machines) Do not drive if sleepy. Remember to clean mask, tubing, filter, and reservoir once weekly with soapy water.  We were unable to get a down Load today as the website was down.  We will send in an albuterol inhaler  Use 1-2 puffs as needed for shortness of breath or wheezing, no more than 4 times daily.  Oxygen saturations should be greater than 88% at all times.  Follow up with Dr. Kipp Brood 11/2022 as is scheduled Follow up with Dr. Leonides Schanz  as is scheduled.  We will refer to pulmonary rehab. >> We will call again to ensure they have received the referral.  Will need down Load at follow up to evaluate effectiveness of BiPAP We will call your DME ( [employer]) and have you enrolled in [employer]. Follow up in 2 ,months. Please contact office for sooner follow up if symptoms do not improve or worsen or seek emergency care

## 2022-11-22 NOTE — Telephone Encounter (Signed)
Pt insurance is active and benefits verified through [place]. Co-pay $0.00, DED $1,500.00/$537.27 met, out of pocket $3,000.00/$543.99 met, co-insurance 10%. No pre-authorization required. Sam/[employer], 11/22/22 @ 1:30PM, BVQ#[id number]

## 2022-11-23 ENCOUNTER — Other Ambulatory Visit (HOSPITAL_COMMUNITY): Payer: Self-pay

## 2022-11-23 MED ORDER — TRAMADOL HCL 50 MG PO TABS
50.0000 mg | ORAL_TABLET | Freq: Four times a day (QID) | ORAL | 0 refills | Status: DC | PRN
  Filled 2022-11-23: qty 28, 7d supply, fill #0

## 2022-11-25 ENCOUNTER — Telehealth: Payer: Self-pay | Admitting: Acute Care

## 2022-11-25 DIAGNOSIS — J9859 Other diseases of mediastinum, not elsewhere classified: Secondary | ICD-10-CM

## 2022-11-25 DIAGNOSIS — R5381 Other malaise: Secondary | ICD-10-CM

## 2022-11-25 NOTE — Telephone Encounter (Signed)
New OT referral placed with sarah's recommendations for treatment. Last referral was rejected based on dx. Will try again. Nothing further needed

## 2022-11-25 NOTE — Telephone Encounter (Signed)
George Wallace,  I dont remember doing a ref for neuro? Do you?  I know we did   Pulm rehab  OT which I'm working on  And what else.   We didn't do neuro correct??  George Wallace

## 2022-11-25 NOTE — Telephone Encounter (Signed)
Occupational therapy is done thru neuro rehab.

## 2022-11-25 NOTE — Progress Notes (Unsigned)
Cardiology Office Note:    Date:  11/27/2022   ID:  George Wallace, DOB November 16, 1960, MRN [id number]  PCP:  Gweneth Dimitri, MD   [facility] Providers Cardiologist:  None {  Referring MD: Bevelyn Ngo, NP     History of Present Illness:    George Wallace is a 62 y.o. 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  Per review of the chart, the patient was originally undergoing CT scan for Ca score and was noted to have a nodular mass in the thymus.PET/CT scan which showed avidity in the anterior mediastinal mass concerning for lymphoma. Biopsies at [facility] were nonconclusive. 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 Cliffton Asters on 10/07/2022. Some of the left phrenic nerve was deeply invested into the tumor. It was taken en bloc resulting in left hemidiaphragm elevation. Now requires BiPAP.   Was seen in follow-up by Jefm Bryant on 11/22/22. He was feeling better. He is planned for possible plication of his left hemidiaphragm in the future. TTE 10/2022 with LVEF 60-65%, normal RV, no significant valve disease. Was notably having significant LE edema at that visit prompting visit today.  Today, the patient states that he has been experiencing LE edema since his discharge from his hospitalization. Initially started in the ankles but has progressed to the thighs. Also has continued dyspnea on exertion that he thinks is slightly worse than a couple of weeks ago. Wears BiPAP at night and sleeps in a recliner to help with breathing following surgery. No lightheadedness, dizziness, or syncope. Continues to have chronic chest tightness following surgery which is not exertion related.   Admits that he has been having a lot of salt and  take out which may be contributing to symptoms.     Past Medical History:  Diagnosis Date   Diabetes (HCC)    Diet controlled   Hyperlipidemia     Past Surgical History:  Procedure Laterality Date   INTERCOSTAL NERVE BLOCK  10/07/2022   Procedure: INTERCOSTAL NERVE BLOCK;  Surgeon: Corliss Skains, MD;  Location: [facility] OR;  Service: Thoracic;;   SINUS SURGERY WITH INSTATRAK      Current Medications: Current Meds  Medication Sig   acetaminophen (TYLENOL) 500 MG tablet Take 1,000 mg by mouth every 6 (six) hours as needed for moderate pain.   albuterol (VENTOLIN HFA) 108 (90 Base) MCG/ACT inhaler Inhale 2 puffs into the lungs every 4 (four) hours as needed for wheezing.   amiodarone (PACERONE) 200 MG tablet Take 1 tablet (200 mg total) by mouth daily.   aspirin EC 325 MG tablet Take 1 tablet (325 mg total) by mouth daily.   cetirizine (ZYRTEC) 10 MG tablet Take 10 mg by mouth daily as needed for allergies.   docusate sodium (COLACE) 100 MG capsule Take 100 mg by mouth 2 (two) times daily.   furosemide (LASIX) 40 MG tablet Take 1 tablet (40 mg total) by mouth daily.   ibuprofen (ADVIL) 200 MG tablet Take 600 mg by mouth 3 (three) times daily.   lisinopril (ZESTRIL) 2.5 MG tablet Take 1 tablet (2.5 mg total) by mouth daily.   lisinopril (ZESTRIL) 2.5 MG tablet Take 1 tablet (2.5 mg total) by mouth daily.   metFORMIN (GLUCOPHAGE-XR) 500 MG 24 hr  tablet Take 500 mg by mouth every morning.   metoprolol succinate (TOPROL-XL) 25 MG 24 hr tablet Take 3 tablets (75 mg total) by mouth daily.   potassium chloride SA (KLOR-CON M) 20 MEQ tablet Take 1 tablet (20 mEq total) by mouth daily.   rosuvastatin (CRESTOR) 10 MG tablet Take 1 tablet (10 mg total) by mouth daily.   Spacer/Aero-Holding Chambers (AEROCHAMBER PLUS FLO-VU LARGE) MISC 1 each by Other route once.   tamsulosin (FLOMAX) 0.4 MG CAPS capsule Take 1 capsule (0.4 mg total) by mouth daily.   tamsulosin (FLOMAX) 0.4 MG CAPS capsule  Take 1 capsule (0.4 mg total) by mouth daily.   tamsulosin (FLOMAX) 0.4 MG CAPS capsule Take 1 capsule (0.4 mg total) by mouth daily.   traMADol (ULTRAM) 50 MG tablet Take 1 tablet (50 mg total) by mouth every 6 (six) hours as needed.   traMADol (ULTRAM) 50 MG tablet Take 1 tablet (50 mg total) by mouth every 6 (six) hours as needed for pain.   [DISCONTINUED] amiodarone (PACERONE) 200 MG tablet Take 1 tablet (200 mg total) by mouth 2 (two) times daily.     Allergies:   Patient has no known allergies.   Social History   Socioeconomic History   Marital status: Married    Spouse name: Not on file   Number of children: 2   Years of education: Not on file   Highest education level: Not on file  Occupational History   Not on file  Tobacco Use   Smoking status: Never   Smokeless tobacco: Never   Tobacco comments:    both parents smoked in home growing up.   Vaping Use   Vaping Use: Never used  Substance and Sexual Activity   Alcohol use: No    Alcohol/week: 0.0 standard drinks of alcohol   Drug use: No   Sexual activity: Not on file  Other Topics Concern   Not on file  Social History Narrative   Not on file   Social Determinants of Health   Financial Resource Strain: Not on file  Food Insecurity: No Food Insecurity (10/07/2022)   Hunger Vital Sign    Worried About Running Out of Food in the Last Year: Never true    Ran Out of Food in the Last Year: Never true  Transportation Needs: No Transportation Needs (10/07/2022)   PRAPARE - Administrator, Civil Service (Medical): No    Lack of Transportation (Non-Medical): No  Physical Activity: Not on file  Stress: Not on file  Social Connections: Not on file     Family History: The patient's family history includes Emphysema in his father; Heart disease in an other family member.  ROS:   Please see the history of present illness.     All other systems reviewed and are negative.  EKGs/Labs/Other Studies Reviewed:     The following studies were reviewed today: Echo 11/20/2022 1. Left ventricular ejection fraction, by estimation, is 60 to 65%. The  left ventricle has normal function. The left ventricle has no regional  wall motion abnormalities. Left ventricular diastolic parameters were  normal.   2. Right ventricular systolic function is normal. The right ventricular  size is normal.   3. The mitral valve is normal in structure. No evidence of mitral valve  regurgitation. No evidence of mitral stenosis.   4. The aortic valve is normal in structure. Aortic valve regurgitation is  not visualized. No aortic stenosis is present.   5. The  inferior vena cava is normal in size with greater than 50%  respiratory variability, suggesting right atrial pressure of 3 mmHg.  EKG:  EKG 10/08/22: NSR, no ischemic changes  Recent Labs: 10/03/2022: ALT 23 10/16/2022: Hemoglobin 11.5; Platelets 437 10/17/2022: Magnesium 2.3 11/01/2022: BUN 14; Creatinine, Ser 0.94; Potassium 4.5; Pro B Natriuretic peptide (BNP) 9.0; Sodium 139  Recent Lipid Panel No results found for: "CHOL", "TRIG", "HDL", "CHOLHDL", "VLDL", "LDLCALC", "LDLDIRECT"   Risk Assessment/Calculations:                Physical Exam:    VS:  BP 108/70   Pulse 61   Ht 5\' 9"  (1.753 m)   Wt 286 lb 12.8 oz (130.1 kg)   SpO2 99%   BMI 42.35 kg/m     Wt Readings from Last 3 Encounters:  11/27/22 286 lb 12.8 oz (130.1 kg)  11/22/22 289 lb (131.1 kg)  11/01/22 282 lb (127.9 kg)     GEN:  Well nourished, well developed in no acute distress HEENT: Normal NECK: No JVD; No carotid bruits CARDIAC: RRR, no murmurs, rubs, gallops RESPIRATORY:  Diminished at left lung base. Otherwise clear ABDOMEN: Soft, non-tender, non-distended MUSCULOSKELETAL:  2+ pitting edema to the knees. Warm SKIN: Warm and dry NEUROLOGIC:  Alert and oriented x 3 PSYCHIATRIC:  Normal affect   ASSESSMENT:    1. Bilateral lower extremity edema   2. Elevated coronary  artery calcium score   3. Aortic atherosclerosis (HCC)   4. Atrial fibrillation, unspecified type (HCC)   5. Medication management    PLAN:    In order of problems listed above:  #LE Edema: Has 2+ bilateral pitting edema on exam. TTE with LVEF 60-65%, normal diastolic function, normal filling pressures. No significant valve disease. Suspect he has venous insufficiency at baseline with acute worsening of symptoms in the setting of significant salt intake recently. Diastolic function normal on echo and BP well controlled. Will start lasix 40mg  PO daily and monitor response. Continue compression sock therapy and leg elevation as able. -Start lasix 40mg  daily -Start K+ 12/31/22 daily with lasix -Check BNP today  -BMET next week -Continue compression socks and leg elevation as able -Low Na diet discussed at length  #Coronary Artery Ca: Ca score 4.31 which is 25% for age, gender, race matched controls. Will continue with medical management. -Continue crestor 10mg  daily -Goal LDL<70  #Post-op Atrial Fibrillation: Noted during admission for thymic mass removal. Has been maintained on amiodarone. Currently in NSR. -Decrease amiodarone to 200mg  daily -Check cardiac monitor to assess for recurrece  #Thymic Mass s/p Resection: #Left Hemidiaphragm: Patient with thymic mass requiring surgical removal. Mass was encasing the left phrenic nerve and it was removed en bloc. Now with left hemidiaphragm requiring BiPAP at night. Follows closely with Pulm and CT surgery.  #HTN: Well controlled and at goal <130/80. -Continue lisinopril 2.5mg  daily -Continue metop 25mg  XL daily           Medication Adjustments/Labs and Tests Ordered: Current medicines are reviewed at length with the patient today.  Concerns regarding medicines are outlined above.  Orders Placed This Encounter  Procedures   Pro b natriuretic peptide   Basic metabolic panel   LONG TERM MONITOR (3-14 DAYS)   Meds ordered this  encounter  Medications   amiodarone (PACERONE) 200 MG tablet    Sig: Take 1 tablet (200 mg total) by mouth daily.    Dispense:  90 tablet    Refill:  1  Dose decrease   furosemide (LASIX) 40 MG tablet    Sig: Take 1 tablet (40 mg total) by mouth daily.    Dispense:  90 tablet    Refill:  2   potassium chloride SA (KLOR-CON M) 20 MEQ tablet    Sig: Take 1 tablet (20 mEq total) by mouth daily.    Dispense:  90 tablet    Refill:  2    Patient Instructions  Medication Instructions:   DECREASE YOUR AMIODARONE TO 200 MG BY MOUTH DAILY  START TAKING LASIX 40 MG BY MOUTH DAILY  START TAKING POTASSIUM CHLORIDE 20 mEq BY MOUTH DAILY  *If you need a refill on your cardiac medications before your next appointment, please call your pharmacy*   Lab Work:  1.)  TODAY--PRO-BNP  2.)  IN ONE WEEK HERE IN THE OFFICE--BMET  If you have labs (blood work) drawn today and your tests are completely normal, you will receive your results only by: [place] (if you have MyChart) OR A paper copy in the mail If you have any lab test that is abnormal or we need to change your treatment, we will call you to review the results.   TESTING:  ZIO XT- Long Term Monitor Instructions  Your physician has requested you wear a ZIO patch monitor for 14 days.  This is a single patch monitor. [employer] supplies one patch monitor per enrollment. Additional stickers are not available. Please do not apply patch if you will be having a Nuclear Stress Test,  Echocardiogram, Cardiac CT, MRI, or Chest Xray during the period you would be wearing the  monitor. The patch cannot be worn during these tests. You cannot remove and re-apply the  ZIO XT patch monitor.  Your ZIO patch monitor will be mailed 3 day USPS to your address on file. It may take 3-5 days  to receive your monitor after you have been enrolled.  Once you have received your monitor, please review the enclosed instructions. Your monitor  has  already been registered assigning a specific monitor serial # to you.  Billing and Patient Assistance Program Information  We have supplied [employer] with any of your insurance information on file for billing purposes. [employer] offers a sliding scale Patient Assistance Program for patients that do not have  insurance, or whose insurance does not completely cover the cost of the ZIO monitor.  You must apply for the Patient Assistance Program to qualify for this discounted rate.  To apply, please call [employer] at [phone], select option 4, select option 2, ask to apply for  Patient Assistance Program. Theodore Demark will ask your household income, and how many people  are in your household. They will quote your out-of-pocket cost based on that information.  [employer] will also be able to set up a 42-month, interest-free payment plan if needed.  Applying the monitor   Shave hair from upper left chest.  Hold abrader disc by orange tab. Rub abrader in 40 strokes over the upper left chest as  indicated in your monitor instructions.  Clean area with 4 enclosed alcohol pads. Let dry.  Apply patch as indicated in monitor instructions. Patch will be placed under collarbone on left  side of chest with arrow pointing upward.  Rub patch adhesive wings for 2 minutes. Remove white label marked "1". Remove the white  label marked "2". Rub patch adhesive wings for 2 additional minutes.  While looking in a mirror, press and release button in center of patch. A  small green light will  flash 3-4 times. This will be your only indicator that the monitor has been turned on.  Do not shower for the first 24 hours. You may shower after the first 24 hours.  Press the button if you feel a symptom. You will hear a small click. Record Date, Time and  Symptom in the Patient Logbook.  When you are ready to remove the patch, follow instructions on the last 2 pages of Patient  Logbook. Stick patch monitor onto the last page of  Patient Logbook.  Place Patient Logbook in the blue and white box. Use locking tab on box and tape box closed  securely. The blue and white box has prepaid postage on it. Please place it in the mailbox as  soon as possible. Your physician should have your test results approximately 7 days after the  monitor has been mailed back to [facility].  Call [place] at [phone] if you have questions regarding  your ZIO XT patch monitor. Call them immediately if you see an orange light blinking on your  monitor.  If your monitor falls off in less than 4 days, contact our Monitor department at [phone].  If your monitor becomes loose or falls off after 4 days call [employer] at [phone] for  suggestions on securing your monitor     Follow-Up:  ONE MONTH WITH AN EXTENDER IN THE OFFICE--SCHEDULE WITH AN EXTENDER PER DR. Johney Frame    Signed, Freada Bergeron, MD  11/27/2022 12:09 PM    [place]

## 2022-11-27 ENCOUNTER — Ambulatory Visit (INDEPENDENT_AMBULATORY_CARE_PROVIDER_SITE_OTHER): Payer: BC Managed Care – PPO

## 2022-11-27 ENCOUNTER — Telehealth: Payer: Self-pay | Admitting: *Deleted

## 2022-11-27 ENCOUNTER — Encounter: Payer: Self-pay | Admitting: Cardiology

## 2022-11-27 ENCOUNTER — Ambulatory Visit: Payer: BC Managed Care – PPO | Attending: Cardiology | Admitting: Cardiology

## 2022-11-27 VITALS — BP 108/70 | HR 61 | Ht 69.0 in | Wt 286.8 lb

## 2022-11-27 DIAGNOSIS — R600 Localized edema: Secondary | ICD-10-CM | POA: Diagnosis not present

## 2022-11-27 DIAGNOSIS — I4891 Unspecified atrial fibrillation: Secondary | ICD-10-CM

## 2022-11-27 DIAGNOSIS — R931 Abnormal findings on diagnostic imaging of heart and coronary circulation: Secondary | ICD-10-CM | POA: Diagnosis not present

## 2022-11-27 DIAGNOSIS — I700 Atherosclerosis of aorta: Secondary | ICD-10-CM | POA: Diagnosis not present

## 2022-11-27 DIAGNOSIS — Z79899 Other long term (current) drug therapy: Secondary | ICD-10-CM

## 2022-11-27 MED ORDER — AMIODARONE HCL 200 MG PO TABS: 200.0000 mg | ORAL_TABLET | Freq: Every day | ORAL | 1 refills | Status: DC

## 2022-11-27 MED ORDER — POTASSIUM CHLORIDE CRYS ER 20 MEQ PO TBCR: 20.0000 meq | EXTENDED_RELEASE_TABLET | Freq: Every day | ORAL | 2 refills | Status: DC

## 2022-11-27 MED ORDER — FUROSEMIDE 40 MG PO TABS: 40.0000 mg | ORAL_TABLET | Freq: Every day | ORAL | 2 refills | Status: DC

## 2022-11-27 NOTE — Progress Notes (Unsigned)
Enrolled patient for a 14 day Zio XT  monitor to be mailed to patients home  °

## 2022-11-27 NOTE — Patient Instructions (Addendum)
Medication Instructions:   DECREASE YOUR AMIODARONE TO 200 MG BY MOUTH DAILY  START TAKING LASIX 40 MG BY MOUTH DAILY  START TAKING POTASSIUM CHLORIDE 20 mEq BY MOUTH DAILY  *If you need a refill on your cardiac medications before your next appointment, please call your pharmacy*   Lab Work:  1.)  TODAY--PRO-BNP  2.)  IN ONE WEEK HERE IN THE OFFICE--BMET  If you have labs (blood work) drawn today and your tests are completely normal, you will receive your results only by: [place] (if you have MyChart) OR A paper copy in the mail If you have any lab test that is abnormal or we need to change your treatment, we will call you to review the results.   TESTING:  ZIO XT- Long Term Monitor Instructions  Your physician has requested you wear a ZIO patch monitor for 14 days.  This is a single patch monitor. [employer] supplies one patch monitor per enrollment. Additional stickers are not available. Please do not apply patch if you will be having a Nuclear Stress Test,  Echocardiogram, Cardiac CT, MRI, or Chest Xray during the period you would be wearing the  monitor. The patch cannot be worn during these tests. You cannot remove and re-apply the  ZIO XT patch monitor.  Your ZIO patch monitor will be mailed 3 day USPS to your address on file. It may take 3-5 days  to receive your monitor after you have been enrolled.  Once you have received your monitor, please review the enclosed instructions. Your monitor  has already been registered assigning a specific monitor serial # to you.  Billing and Patient Assistance Program Information  We have supplied [employer] with any of your insurance information on file for billing purposes. [employer] offers a sliding scale Patient Assistance Program for patients that do not have  insurance, or whose insurance does not completely cover the cost of the ZIO monitor.  You must apply for the Patient Assistance Program to qualify for this discounted  rate.  To apply, please call [employer] at [phone], select option 4, select option 2, ask to apply for  Patient Assistance Program. Theodore Demark will ask your household income, and how many people  are in your household. They will quote your out-of-pocket cost based on that information.  [employer] will also be able to set up a 80-month, interest-free payment plan if needed.  Applying the monitor   Shave hair from upper left chest.  Hold abrader disc by orange tab. Rub abrader in 40 strokes over the upper left chest as  indicated in your monitor instructions.  Clean area with 4 enclosed alcohol pads. Let dry.  Apply patch as indicated in monitor instructions. Patch will be placed under collarbone on left  side of chest with arrow pointing upward.  Rub patch adhesive wings for 2 minutes. Remove white label marked "1". Remove the white  label marked "2". Rub patch adhesive wings for 2 additional minutes.  While looking in a mirror, press and release button in center of patch. A small green light will  flash 3-4 times. This will be your only indicator that the monitor has been turned on.  Do not shower for the first 24 hours. You may shower after the first 24 hours.  Press the button if you feel a symptom. You will hear a small click. Record Date, Time and  Symptom in the Patient Logbook.  When you are ready to remove the patch, follow instructions on the last  2 pages of Patient  Logbook. Stick patch monitor onto the last page of Patient Logbook.  Place Patient Logbook in the blue and white box. Use locking tab on box and tape box closed  securely. The blue and white box has prepaid postage on it. Please place it in the mailbox as  soon as possible. Your physician should have your test results approximately 7 days after the  monitor has been mailed back to [facility].  Call [place] at [phone] if you have questions regarding  your ZIO XT patch monitor. Call them  immediately if you see an orange light blinking on your  monitor.  If your monitor falls off in less than 4 days, contact our Monitor department at [phone].  If your monitor becomes loose or falls off after 4 days call [employer] at [phone] for  suggestions on securing your monitor     Follow-Up:  ONE MONTH WITH AN EXTENDER IN THE OFFICE--SCHEDULE WITH AN EXTENDER PER DR. Johney Frame

## 2022-11-27 NOTE — Telephone Encounter (Signed)
RE: 2 WEEK ZIO PER DR. Johney Frame Received: Today Alpha, Katrina Darl Pikes, LPN done       Previous Messages    ----- Message ----- From: Nuala Alpha, LPN Sent: 5/99/3570  12:04 PM EST To: Jennefer Bravo; Katrina Cloyd Stagers Subject: 2 WEEK ZIO PER DR. Johney Frame                  2 week zio for afib ordered  Please enroll and let me know when you do  Thanks Dalaya Suppa

## 2022-11-28 LAB — PRO B NATRIURETIC PEPTIDE: NT-Pro BNP: <36 pg/mL (ref 0–210)

## 2022-11-29 ENCOUNTER — Other Ambulatory Visit (HOSPITAL_COMMUNITY): Payer: Self-pay

## 2022-11-29 MED ORDER — ACCU-CHEK SOFTCLIX LANCETS MISC
1 refills | Status: AC
  Filled 2022-11-29: qty 100, 90d supply, fill #0

## 2022-11-29 MED ORDER — ACCU-CHEK GUIDE VI STRP
ORAL_STRIP | 1 refills | Status: AC
  Filled 2022-11-29: qty 50, 30d supply, fill #0

## 2022-11-29 MED ORDER — ACCU-CHEK GUIDE W/DEVICE KIT
PACK | 0 refills | Status: AC
  Filled 2022-11-29: qty 1, 30d supply, fill #0

## 2022-12-01 DIAGNOSIS — I4891 Unspecified atrial fibrillation: Secondary | ICD-10-CM

## 2022-12-04 ENCOUNTER — Ambulatory Visit: Payer: BC Managed Care – PPO | Attending: Cardiology

## 2022-12-04 DIAGNOSIS — R600 Localized edema: Secondary | ICD-10-CM

## 2022-12-04 DIAGNOSIS — Z79899 Other long term (current) drug therapy: Secondary | ICD-10-CM

## 2022-12-04 DIAGNOSIS — I700 Atherosclerosis of aorta: Secondary | ICD-10-CM | POA: Diagnosis not present

## 2022-12-04 DIAGNOSIS — R931 Abnormal findings on diagnostic imaging of heart and coronary circulation: Secondary | ICD-10-CM | POA: Diagnosis not present

## 2022-12-04 DIAGNOSIS — I4891 Unspecified atrial fibrillation: Secondary | ICD-10-CM | POA: Diagnosis not present

## 2022-12-04 LAB — BASIC METABOLIC PANEL
BUN/Creatinine Ratio: 10 (ref 9–20)
BUN: 10 mg/dL (ref 6–24)
CO2: 27 mmol/L (ref 20–29)
Calcium: 10 mg/dL (ref 8.7–10.2)
Chloride: 101 mmol/L (ref 96–106)
Creatinine, Ser: 1.04 mg/dL (ref 0.76–1.27)
Glucose: 111 mg/dL — ABNORMAL HIGH (ref 70–99)
Potassium: 4.3 mmol/L (ref 3.5–5.2)
Sodium: 142 mmol/L (ref 134–144)
eGFR: 83 mL/min/{1.73_m2} (ref >59)

## 2022-12-05 ENCOUNTER — Telehealth (HOSPITAL_COMMUNITY): Payer: Self-pay

## 2022-12-05 NOTE — Telephone Encounter (Signed)
Called to confirm appt. Pt confirmed appt. Instructed pt on proper footwear and COVID screening. Gave directions along with department number.   

## 2022-12-06 ENCOUNTER — Ambulatory Visit: Payer: BC Managed Care – PPO | Attending: Acute Care | Admitting: Physical Therapy

## 2022-12-06 VITALS — BP 115/67 | HR 69

## 2022-12-06 DIAGNOSIS — R5381 Other malaise: Secondary | ICD-10-CM | POA: Insufficient documentation

## 2022-12-06 DIAGNOSIS — R2689 Other abnormalities of gait and mobility: Secondary | ICD-10-CM | POA: Insufficient documentation

## 2022-12-06 DIAGNOSIS — M6281 Muscle weakness (generalized): Secondary | ICD-10-CM | POA: Insufficient documentation

## 2022-12-06 DIAGNOSIS — J9859 Other diseases of mediastinum, not elsewhere classified: Secondary | ICD-10-CM | POA: Insufficient documentation

## 2022-12-06 NOTE — Therapy (Signed)
OUTPATIENT PHYSICAL THERAPY NEURO EVALUATION   Patient Name: George Wallace MRN: [id number] DOB:[birth year], 62 y.o., male 80 Date: 12/06/2022   PCP: Cari Caraway, MD REFERRING PROVIDER: Magdalen Spatz, NP  END OF SESSION:  PT End of Session - 12/06/22 1234     Visit Number 1    Number of Visits 5   with eval   Date for PT Re-Evaluation 01/10/23    Authorization Type BC[employer]    PT Start Time 1232    PT Stop Time 1315    PT Time Calculation (min) 43 min    Activity Tolerance Patient tolerated treatment well    Behavior During Therapy WFL for tasks assessed/performed             Past Medical History:  Diagnosis Date   Diabetes ([place])    Diet controlled   Hyperlipidemia    Past Surgical History:  Procedure Laterality Date   INTERCOSTAL NERVE BLOCK  10/07/2022   Procedure: INTERCOSTAL NERVE BLOCK;  Surgeon: Lajuana Matte, MD;  Location: [facility];  Service: Thoracic;;   SINUS SURGERY WITH INSTATRAK     Patient Active Problem List   Diagnosis Date Noted   Acute respiratory failure ([place]) 10/07/2022   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   Type 2 diabetes mellitus without complication, without long-term current use of insulin ([place]) 10/07/2022   Hardening of the aorta (main artery of the heart) ([place]) 06/07/2022   Excessive sleepiness 06/07/2022   Hyperlipidemia, unspecified 06/07/2022   Inattention 06/07/2022   Obstructive sleep apnea (adult) (pediatric) 06/07/2022   Personal history of colonic polyps 06/07/2022   Type 2 diabetes mellitus with other specified complication ([place]) 99991111   Vitamin D deficiency 06/07/2022   Mediastinal mass 05/31/2022   Intermittent asthma without complication [id number]   Cardiomegaly 12/06/2014   Wheeze 12/06/2014   Morbid obesity ([place]) 12/06/2014    ONSET DATE: 11/25/2022  REFERRING DIAG: J98.59 (ICD-10-CM) - Mediastinal mass R53.81 (ICD-10-CM) - Physical deconditioning  THERAPY DIAG:  Other abnormalities of gait and  mobility  Muscle weakness (generalized)  Rationale for Evaluation and Treatment: Rehabilitation  SUBJECTIVE:                                                                                                                                                                                             SUBJECTIVE STATEMENT: Pt reports having a recent surgery to remove a mass and that the mass was wrapped around his phrenic nerve. 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 Pt trying to decide if he possibly  wants another surgery involving his diaphragm being tied down vs also looking into having his phrenic nerve reattached by a surgeon in [place]. Pt reports some difficulty breathing when seated in low chairs due to pressure from his diaphragm. Pt reports that he does have cardiac rehab/pulmary rehab scheduled on Monday. Pt educated that he can participate in cardiac rehab or PT but likely not both at the same time, pt to decide after his appointment Monday which one he wishes to continue with.  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 Yes: NPRS scale: 6/10 Pain location: chest and abdomen from incisions Pain description: tightness, pulling Aggravating factors: lifting, certain movements Relieving factors: pain medication  PRECAUTIONS: Sternal  WEIGHT BEARING RESTRICTIONS: No  FALLS: Has patient fallen in last 6 months? 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 Left hemidiaphragm is elevated. No significant interval changes are noted since 10/18/2022  COGNITION: Overall cognitive status: Within functional limits for tasks assessed   SENSATION: WFL  EDEMA:  Pitting edema in BLE: pt on lasix and wears compression stockings  POSTURE: rounded shoulders, forward head, and posterior pelvic tilt   LOWER EXTREMITY MMT:    MMT Right Eval Left Eval  Hip flexion 5 5  Hip extension    Hip abduction    Hip adduction    Hip internal rotation    Hip external rotation    Knee flexion 5 5  Knee extension 5 5  Ankle dorsiflexion 5 5  Ankle plantarflexion    Ankle inversion    Ankle eversion    (Blank rows = not tested)  BED MOBILITY:  Sleeping in recliner right now due to poor tolerance for supine position  GAIT: Gait pattern: WFL Distance walked: various clinic distances Assistive device utilized: None Level of assistance: Modified independence Comments: increased time needed, otherwise WFL  FUNCTIONAL TESTS:   [facility] PT Assessment - 12/06/22 1255       Ambulation/Gait   Gait velocity 32.8 ft over 10.5 sec = 3.12 ft/sec      6 minute walk test results    Aerobic Endurance Distance Walked 1169    Endurance additional comments RPE 2/10      Standardized Balance Assessment   Standardized Balance Assessment Timed Up and Go Test;Five Times Sit to Stand    Five times sit to stand comments  12 sec   BUE pushing from arms of rollator     Timed Up and Go Test   TUG Normal TUG    Normal TUG (seconds) 9.66    no AD  TODAY'S TREATMENT:                                                                                                                              PT evaluation   PATIENT EDUCATION: Education details: Eval findings, PT POC, reach out to clinic to cancel scheduled appts if he decides to pursue cardiac rehab instead Person educated: Patient Education method: Explanation, Demonstration, and Handouts Education comprehension: verbalized understanding and returned demonstration  HOME EXERCISE PROGRAM: Initiated home walking program:  Walking Program: Walk with your family. Starting next week, walk 5 min per day for 7 days. Following week add 5 minutes to your total time. Week 1: 10 min Week 2: 15 min Week 3: 20 min Week 4: 25 min...Marland KitchenMarland KitchenUntil you can walk to 30 min per day   GOALS: Goals reviewed with patient? Yes  SHORT TERM GOALS=LONG TERM GOALS due to length of POC  LONG TERM GOALS: Target date: 01/10/2023   Pt will be independent with final HEP for improved strength, balance, transfers and gait. Baseline: walking program initiated at eval (2/9) Goal status: INITIAL  2.  Pt will improve gait velocity to at least 3.5 ft/sec for improved gait efficiency and performance at mod I level  Baseline: 3.12 ft/sec (2/9) Goal status: INITIAL  3.  Pt will ambulate greater than or equal to 1800 feet on 6MWT with LRAD and mod I for improved cardiovascular endurance and BLE strength.  Baseline: 1169 ft no AD mod I (2/9) Goal status: INITIAL   ASSESSMENT:  CLINICAL IMPRESSION: Patient is a 62 year old male referred to Neuro [facility] for physical deconditioning due to a mediastinal mass removal and injury to his phrenic nerve.   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 Pt would benefit from skilled PT to address these impairments and functional limitations to maximize functional mobility independence.   OBJECTIVE IMPAIRMENTS: cardiopulmonary status limiting activity, decreased activity tolerance, decreased endurance, impaired perceived functional ability, and pain.   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   REHAB POTENTIAL: Good  CLINICAL DECISION MAKING: Stable/uncomplicated  EVALUATION COMPLEXITY: Low  PLAN:  PT FREQUENCY: 1x/week  PT DURATION: 4 weeks  PLANNED INTERVENTIONS: Therapeutic exercises, Therapeutic activity, Neuromuscular re-education, Balance training, Gait training, Patient/Family education, Self Care, Joint mobilization, Stair training, DME instructions, Aquatic Therapy, Dry Needling, Electrical stimulation, Cryotherapy, Moist heat, Taping, Manual therapy, and Re-evaluation  PLAN FOR NEXT SESSION: work on endurance (treadmill, elliptical, SciFit) and other endurance activities within sternal precautions monitor  SpO2; pt with poor tolerance for sitting on lower surfaces, work on walking up/down stairs, ramps, uneven surfaces outdoors   Excell Seltzer, PT, DPT, CSRS 12/06/2022, 1:16 PM

## 2022-12-07 ENCOUNTER — Encounter: Payer: Self-pay | Admitting: Cardiology

## 2022-12-09 ENCOUNTER — Encounter (HOSPITAL_COMMUNITY)
Admission: RE | Admit: 2022-12-09 | Discharge: 2022-12-09 | Disposition: A | Discharge status: Home / Self Care | Payer: BC Managed Care – PPO | Source: Ambulatory Visit | Attending: Internal Medicine | Admitting: Internal Medicine

## 2022-12-09 ENCOUNTER — Encounter (HOSPITAL_COMMUNITY): Payer: Self-pay

## 2022-12-09 VITALS — BP 108/68 | HR 64 | Wt 271.6 lb

## 2022-12-09 DIAGNOSIS — R0602 Shortness of breath: Secondary | ICD-10-CM | POA: Diagnosis not present

## 2022-12-09 LAB — GLUCOSE, CAPILLARY: Glucose-Capillary: 102 mg/dL — ABNORMAL HIGH (ref 70–99)

## 2022-12-09 NOTE — Progress Notes (Signed)
George Wallace 62 y.o. male Pulmonary Rehab Orientation Note This patient who was referred to Pulmonary Rehab by Dr. Melvyn Novas with the diagnosis of SOB arrived today in Cardiac and Pulmonary Rehab. He arrived ambulatory with assistive device with normal gait. He does not carry portable oxygen. Per patient, George Wallace uses a BiPAP at night. Color good, skin warm and dry. Patient is oriented to time and place. Patient's medical history, psychosocial health, and medications reviewed. Psychosocial assessment reveals patient lives with spouse. George Wallace is currently full time job working at [employer]. Patient hobbies include cooking and spending time with others. Patient reports his stress level is low. Areas of stress/anxiety include health. Patient does not exhibit signs of depression. PHQ2/9 score 0/0. George Wallace shows good  coping skills with positive outlook on life. Offered emotional support and reassurance. Will continue to monitor. Physical assessment performed by George Ores, RN. Please see their orientation physical assessment note. George Wallace reports he  does take medications as prescribed. Patient states he  follows a regular  diet. The patient has been trying to lose weight through a healthy diet and exercise program.. Patient's weight will be monitored closely. Demonstration and practice of PLB using pulse oximeter. George Wallace able to return demonstration satisfactorily. Safety and hand hygiene in the exercise area reviewed with patient. George Wallace voices understanding of the information reviewed. Department expectations discussed with patient and achievable goals were set. The patient shows enthusiasm about attending the program and we look forward to working with George Wallace. George Wallace completed a 6 min walk test today and is scheduled to begin exercise on 12/17/22 at 10:15 am.   [id number] George Plumber, MS, ACSM-CEP

## 2022-12-09 NOTE — Progress Notes (Signed)
Pulmonary Individual Treatment Plan  Patient Details  Name: George Wallace MRN: [id number] Date of Birth: Jul 10, 1961 Referring Provider:   April Manson Pulmonary Rehab Walk Test from 12/09/2022 in [facility] for [facility], [facility], & [place]  Referring Provider [name]       Initial Encounter Date:  Flowsheet Row Pulmonary Rehab Walk Test from 12/09/2022 in [facility] & [place]  Date 12/09/22       Visit Diagnosis: Shortness of breath  Patient's Home Medications on Admission:   Current Outpatient Medications:    Accu-Chek Softclix Lancets lancets, Use to check blood sugar daily, Disp: 100 each, Rfl: 1   acetaminophen (TYLENOL) 500 MG tablet, Take 1,000 mg by mouth every 6 (six) hours as needed for moderate pain., Disp: , Rfl:    albuterol (VENTOLIN HFA) 108 (90 Base) MCG/ACT inhaler, Inhale 2 puffs into the lungs every 4 (four) hours as needed for wheezing., Disp: 6.7 g, Rfl: 2   amiodarone (PACERONE) 200 MG tablet, Take 1 tablet (200 mg total) by mouth daily., Disp: 90 tablet, Rfl: 1   aspirin EC 325 MG tablet, Take 1 tablet (325 mg total) by mouth daily., Disp: , Rfl:    Blood Glucose Monitoring Suppl (ACCU-CHEK GUIDE) w/Device KIT, Use to check blood sugar daily, Disp: 1 kit, Rfl: 0   cetirizine (ZYRTEC) 10 MG tablet, Take 10 mg by mouth daily as needed for allergies., Disp: , Rfl:    docusate sodium (COLACE) 100 MG capsule, Take 100 mg by mouth 2 (two) times daily., Disp: , Rfl:    furosemide (LASIX) 40 MG tablet, Take 1 tablet (40 mg total) by mouth daily., Disp: 90 tablet, Rfl: 2   glucose blood (ACCU-CHEK GUIDE) test strip, Use to check blood sugar daily, Disp: 100 strip, Rfl: 1   ibuprofen (ADVIL) 200 MG tablet, Take 600 mg by mouth 3 (three) times daily., Disp: , Rfl:    lisinopril (ZESTRIL) 2.5 MG tablet, Take 1 tablet (2.5 mg total) by mouth daily., Disp: 30 tablet, Rfl: 1   lisinopril (ZESTRIL)  2.5 MG tablet, Take 1 tablet (2.5 mg total) by mouth daily., Disp: 90 tablet, Rfl: 1   metFORMIN (GLUCOPHAGE-XR) 500 MG 24 hr tablet, Take 500 mg by mouth every morning., Disp: , Rfl:    metoprolol succinate (TOPROL-XL) 25 MG 24 hr tablet, Take 3 tablets (75 mg total) by mouth daily., Disp: 90 tablet, Rfl: 1   potassium chloride SA (KLOR-CON M) 20 MEQ tablet, Take 1 tablet (20 mEq total) by mouth daily., Disp: 90 tablet, Rfl: 2   rosuvastatin (CRESTOR) 10 MG tablet, Take 1 tablet (10 mg total) by mouth daily., Disp: 90 tablet, Rfl: 0   Spacer/Aero-Holding Chambers (AEROCHAMBER PLUS FLO-VU LARGE) MISC, 1 each by Other route once., Disp: 1 each, Rfl: 0   tamsulosin (FLOMAX) 0.4 MG CAPS capsule, Take 1 capsule (0.4 mg total) by mouth daily., Disp: 30 capsule, Rfl: 1   tamsulosin (FLOMAX) 0.4 MG CAPS capsule, Take 1 capsule (0.4 mg total) by mouth daily., Disp: 30 capsule, Rfl: 2   tamsulosin (FLOMAX) 0.4 MG CAPS capsule, Take 1 capsule (0.4 mg total) by mouth daily., Disp: 90 capsule, Rfl: 1   traMADol (ULTRAM) 50 MG tablet, Take 1 tablet (50 mg total) by mouth every 6 (six) hours as needed., Disp: 28 tablet, Rfl: 0   traMADol (ULTRAM) 50 MG tablet, Take 1 tablet (50 mg total) by mouth every 6 (six) hours as  needed for pain., Disp: 28 tablet, Rfl: 0  Past Medical History: Past Medical History:  Diagnosis Date   Diabetes ([place])    Diet controlled   Hyperlipidemia     Tobacco Use: Social History   Tobacco Use  Smoking Status Never  Smokeless Tobacco Never  Tobacco Comments   both parents smoked in home growing up.     Labs: Review Flowsheet       Latest Ref Rng & Units 02/15/2015 10/03/2022 10/07/2022 10/18/2022  Labs for ITP Cardiac and Pulmonary Rehab  Hemoglobin A1c 4.8 - 5.6 % - 6.9  - -  PH, Arterial 7.35 - 7.45 - - 7.277  7.284  7.295  7.277  7.199  7.46   PCO2 arterial 32 - 48 mmHg - - 44.1  43.0  42.4  42.8  56.8  41   Bicarbonate 20.0 - 28.0 mmol/L - - 20.9  20.4  20.6  19.9   22.1  29.2   TCO2 22 - 32 mmol/L 22 - 32 mmol/L 23  - 22  23  22  22  21  24  $ -  Acid-base deficit 0.0 - 2.0 mmol/L - - 6.0  6.0  6.0  7.0  6.0  -  O2 Saturation % - - 99  100  100  100  93  95.6     Capillary Blood Glucose: Lab Results  Component Value Date   GLUCAP 102 (H) 12/09/2022   GLUCAP 115 (H) 10/22/2022   GLUCAP 106 (H) 10/22/2022   GLUCAP 95 10/22/2022   GLUCAP 149 (H) 10/21/2022     Pulmonary Assessment Scores:  Pulmonary Assessment Scores     Row Name 12/09/22 0934         ADL UCSD   ADL Phase Entry     SOB Score total 4       CAT Score   CAT Score 13       mMRC Score   mMRC Score 1             UCSD: Self-administered rating of dyspnea associated with activities of daily living (ADLs) 6-point scale (0 = "not at all" to 5 = "maximal or unable to do because of breathlessness")  Scoring Scores range from 0 to 120.  Minimally important difference is 5 units  CAT: CAT can identify the health impairment of COPD patients and is better correlated with disease progression.  CAT has a scoring range of zero to 40. The CAT score is classified into four groups of low (less than 10), medium (10 - 20), high (21-30) and very high (31-40) based on the impact level of disease on health status. A CAT score over 10 suggests significant symptoms.  A worsening CAT score could be explained by an exacerbation, poor medication adherence, poor inhaler technique, or progression of COPD or comorbid conditions.  CAT MCID is 2 points  mMRC: mMRC (Modified Medical Research Council) Dyspnea Scale is used to assess the degree of baseline functional disability in patients of respiratory disease due to dyspnea. No minimal important difference is established. A decrease in score of 1 point or greater is considered a positive change.   Pulmonary Function Assessment:  Pulmonary Function Assessment - 12/09/22 0934       Breath   Shortness of Breath Yes;Panic with Shortness of  Breath;Limiting activity             Exercise Target Goals: Exercise Program Goal: Individual exercise prescription set using results from initial 6  min walk test and THRR while considering  patient's activity barriers and safety.   Exercise Prescription Goal: Initial exercise prescription builds to 30-45 minutes a day of aerobic activity, 2-3 days per week.  Home exercise guidelines will be given to patient during program as part of exercise prescription that the participant will acknowledge.  Activity Barriers & Risk Stratification:  Activity Barriers & Cardiac Risk Stratification - 12/09/22 0935       Activity Barriers & Cardiac Risk Stratification   Activity Barriers Deconditioning;Muscular Weakness;Shortness of Breath;Assistive Device   paralyzed diaphragm            6 Minute Walk:  6 Minute Walk     Row Name 12/09/22 1035         6 Minute Walk   Phase Initial     Distance 960 feet     Walk Time 6 minutes     # of Rest Breaks 0     MPH 1.82     METS 2.07     RPE 7     Perceived Dyspnea  0.5     VO2 Peak 7.23     Symptoms No     Resting HR 64 bpm     Resting BP 108/68     Resting Oxygen Saturation  100 %     Exercise Oxygen Saturation  during 6 min walk 92 %     Max Ex. HR 96 bpm     Max Ex. BP 122/66     2 Minute Post BP 120/62       Interval HR   1 Minute HR 69     2 Minute HR 80     3 Minute HR 80     4 Minute HR 96     5 Minute HR 88     6 Minute HR 93     2 Minute Post HR 62     Interval Heart Rate? Yes       Interval Oxygen   Interval Oxygen? Yes     Baseline Oxygen Saturation % 100 %     1 Minute Oxygen Saturation % 94 %     1 Minute Liters of Oxygen 0 L     2 Minute Oxygen Saturation % 94 %     2 Minute Liters of Oxygen 0 L     3 Minute Oxygen Saturation % 99 %     3 Minute Liters of Oxygen 0 L     4 Minute Oxygen Saturation % 94 %     4 Minute Liters of Oxygen 0 L     5 Minute Oxygen Saturation % 92 %     5 Minute Liters of  Oxygen 0 L     6 Minute Oxygen Saturation % 94 %     6 Minute Liters of Oxygen 0 L     2 Minute Post Oxygen Saturation % 99 %     2 Minute Post Liters of Oxygen 0 L              Oxygen Initial Assessment:  Oxygen Initial Assessment - 12/09/22 0932       Home Oxygen   Home Oxygen Device None    Sleep Oxygen Prescription BiPAP    Home Exercise Oxygen Prescription None    Home Resting Oxygen Prescription None      Initial 6 min Walk   Oxygen Used None      Program Oxygen Prescription  Program Oxygen Prescription None      Intervention   Short Term Goals To learn and understand importance of maintaining oxygen saturations>88%;To learn and understand importance of monitoring SPO2 with pulse oximeter and demonstrate accurate use of the pulse oximeter.;To learn and demonstrate proper pursed lip breathing techniques or other breathing techniques.     Long  Term Goals Verbalizes importance of monitoring SPO2 with pulse oximeter and return demonstration;Maintenance of O2 saturations>88%;Exhibits proper breathing techniques, such as pursed lip breathing or other method taught during program session;Compliance with respiratory medication;Demonstrates proper use of MDI's             Oxygen Re-Evaluation:   Oxygen Discharge (Final Oxygen Re-Evaluation):   Initial Exercise Prescription:  Initial Exercise Prescription - 12/09/22 1000       Date of Initial Exercise RX and Referring Provider   Date 12/09/22    Referring Provider [name]    Expected Discharge Date 03/06/23      Recumbant Elliptical   Level 1    RPM 20    Minutes 15      Track   Minutes 15    METs 2.07      Prescription Details   Frequency (times per week) 2    Duration Progress to 30 minutes of continuous aerobic without signs/symptoms of physical distress      Intensity   THRR 40-80% of Max Heartrate 81-129    Ratings of Perceived Exertion 11-13    Perceived Dyspnea 0-4      Progression    Progression Continue to progress workloads to maintain intensity without signs/symptoms of physical distress.      Resistance Training   Training Prescription Yes    Weight blue bands    Reps 10-15             Perform Capillary Blood Glucose checks as needed.  Exercise Prescription Changes:   Exercise Comments:   Exercise Goals and Review:   Exercise Goals     Row Name 12/09/22 0935             Exercise Goals   Increase Physical Activity Yes       Intervention Provide advice, education, support and counseling about physical activity/exercise needs.;Develop an individualized exercise prescription for aerobic and resistive training based on initial evaluation findings, risk stratification, comorbidities and participant's personal goals.       Expected Outcomes Short Term: Attend rehab on a regular basis to increase amount of physical activity.;Long Term: Add in home exercise to make exercise part of routine and to increase amount of physical activity.;Long Term: Exercising regularly at least 3-5 days a week.       Increase Strength and Stamina Yes       Intervention Provide advice, education, support and counseling about physical activity/exercise needs.;Develop an individualized exercise prescription for aerobic and resistive training based on initial evaluation findings, risk stratification, comorbidities and participant's personal goals.       Expected Outcomes Short Term: Increase workloads from initial exercise prescription for resistance, speed, and METs.;Short Term: Perform resistance training exercises routinely during rehab and add in resistance training at home;Long Term: Improve cardiorespiratory fitness, muscular endurance and strength as measured by increased METs and functional capacity (6MWT)       Able to understand and use rate of perceived exertion (RPE) scale Yes       Intervention Provide education and explanation on how to use RPE scale       Expected  Outcomes Short  Term: Able to use RPE daily in rehab to express subjective intensity level;Long Term:  Able to use RPE to guide intensity level when exercising independently       Able to understand and use Dyspnea scale Yes       Intervention Provide education and explanation on how to use Dyspnea scale       Expected Outcomes Short Term: Able to use Dyspnea scale daily in rehab to express subjective sense of shortness of breath during exertion;Long Term: Able to use Dyspnea scale to guide intensity level when exercising independently       Knowledge and understanding of Target Heart Rate Range (THRR) Yes       Intervention Provide education and explanation of THRR including how the numbers were predicted and where they are located for reference       Expected Outcomes Short Term: Able to state/look up THRR;Long Term: Able to use THRR to govern intensity when exercising independently;Short Term: Able to use daily as guideline for intensity in rehab       Understanding of Exercise Prescription Yes       Intervention Provide education, explanation, and written materials on patient's individual exercise prescription       Expected Outcomes Short Term: Able to explain program exercise prescription;Long Term: Able to explain home exercise prescription to exercise independently                Exercise Goals Re-Evaluation :   Discharge Exercise Prescription (Final Exercise Prescription Changes):   Nutrition:  Target Goals: Understanding of nutrition guidelines, daily intake of sodium <1569m, cholesterol <2028m calories 30% from fat and 7% or less from saturated fats, daily to have 5 or more servings of fruits and vegetables.  Biometrics:  Pre Biometrics - 12/09/22 0927       Pre Biometrics   Grip Strength 31 kg              Nutrition Therapy Plan and Nutrition Goals:   Nutrition Assessments:  MEDIFICTS Score Key: ?70 Need to make dietary changes  40-70 Heart Healthy Diet ?  40 Therapeutic Level Cholesterol Diet   Picture Your Plate Scores: <[id number] dietary pattern with much room for improvement. 41-50 Dietary pattern unlikely to meet recommendations for good health and room for improvement. 51-60 More healthful dietary pattern, with some room for improvement.  >60 Healthy dietary pattern, although there may be some specific behaviors that could be improved.    Nutrition Goals Re-Evaluation:   Nutrition Goals Discharge (Final Nutrition Goals Re-Evaluation):   Psychosocial: Target Goals: Acknowledge presence or absence of significant depression and/or stress, maximize coping skills, provide positive support system. Participant is able to verbalize types and ability to use techniques and skills needed for reducing stress and depression.  Initial Review & Psychosocial Screening:  Initial Psych Review & Screening - 12/09/22 0937       Initial Review   Current issues with Current Sleep Concerns    Comments Pt has hard time going to sleep      Family Dynamics   Good Support System? Yes    Comments wife and friends      Barriers   Psychosocial barriers to participate in program The patient should benefit from training in stress management and relaxation.      Screening Interventions   Interventions Encouraged to exercise;To provide support and resources with identified psychosocial needs    Expected Outcomes Long Term Goal: Stressors or current issues are controlled or eliminated.  Quality of Life Scores:  Scores of 19 and below usually indicate a poorer quality of life in these areas.  A difference of  2-3 points is a clinically meaningful difference.  A difference of 2-3 points in the total score of the Quality of Life Index has been associated with significant improvement in overall quality of life, self-image, physical symptoms, and general health in studies assessing change in quality of life.  PHQ-9: Review Flowsheet        12/09/2022  Depression screen PHQ 2/9  Decreased Interest 0  Down, Depressed, Hopeless 0  PHQ - 2 Score 0  Altered sleeping 0  Tired, decreased energy 0  Change in appetite 0  Feeling bad or failure about yourself  0  Trouble concentrating 0  Moving slowly or fidgety/restless 0  Suicidal thoughts 0  PHQ-9 Score 0  Difficult doing work/chores Not difficult at all   Interpretation of Total Score  Total Score Depression Severity:  1-4 = Minimal depression, 5-9 = Mild depression, 10-14 = Moderate depression, 15-19 = Moderately severe depression, 20-27 = Severe depression   Psychosocial Evaluation and Intervention:  Psychosocial Evaluation - 12/09/22 1056       Psychosocial Evaluation & Interventions   Interventions Stress management education;Relaxation education;Encouraged to exercise with the program and follow exercise prescription    Comments Pt denies anxiety or depression. He admits he has a hard time sleeping.    Expected Outcomes For [place] to participate in PR free of psychosocial barriers    Continue Psychosocial Services  Follow up required by staff             Psychosocial Re-Evaluation:   Psychosocial Discharge (Final Psychosocial Re-Evaluation):   Education: Education Goals: Education classes will be provided on a weekly basis, covering required topics. Participant will state understanding/return demonstration of topics presented.  Learning Barriers/Preferences:  Learning Barriers/Preferences - 12/09/22 0939       Learning Barriers/Preferences   Learning Barriers None    Learning Preferences Pictoral;Skilled Demonstration;Video;Written Material;Computer/Internet             Education Topics: Introduction to Pulmonary Rehab Group instruction provided by PowerPoint, verbal discussion, and written material to support subject matter. Instructor reviews what Pulmonary Rehab is, the purpose of the program, and how patients are referred.      Know Your Numbers Group instruction that is supported by a PowerPoint presentation. Instructor discusses importance of knowing and understanding resting, exercise, and post-exercise oxygen saturation, heart rate, and blood pressure. Oxygen saturation, heart rate, blood pressure, rating of perceived exertion, and dyspnea are reviewed along with a normal range for these values.    Exercise for the Pulmonary Patient Group instruction that is supported by a PowerPoint presentation. Instructor discusses benefits of exercise, core components of exercise, frequency, duration, and intensity of an exercise routine, importance of utilizing pulse oximetry during exercise, safety while exercising, and options of places to exercise outside of rehab.       MET Level  Group instruction provided by PowerPoint, verbal discussion, and written material to support subject matter. Instructor reviews what METs are and how to increase METs.    Pulmonary Medications Verbally interactive group education provided by instructor with focus on inhaled medications and proper administration.   Anatomy and Physiology of the Respiratory System Group instruction provided by PowerPoint, verbal discussion, and written material to support subject matter. Instructor reviews respiratory cycle and anatomical components of the respiratory system and their functions. Instructor also reviews differences in obstructive and  restrictive respiratory diseases with examples of each.    Oxygen Safety Group instruction provided by PowerPoint, verbal discussion, and written material to support subject matter. There is an overview of "What is Oxygen" and "Why do we need it".  Instructor also reviews how to create a safe environment for oxygen use, the importance of using oxygen as prescribed, and the risks of noncompliance. There is a brief discussion on traveling with oxygen and resources the patient may utilize.   Oxygen Use Group  instruction provided by PowerPoint, verbal discussion, and written material to discuss how supplemental oxygen is prescribed and different types of oxygen supply systems. Resources for more information are provided.    Breathing Techniques Group instruction that is supported by demonstration and informational handouts. Instructor discusses the benefits of pursed lip and diaphragmatic breathing and detailed demonstration on how to perform both.     Risk Factor Reduction Group instruction that is supported by a PowerPoint presentation. Instructor discusses the definition of a risk factor, different risk factors for pulmonary disease, and how the heart and lungs work together.   MD Day A group question and answer session with a medical doctor that allows participants to ask questions that relate to their pulmonary disease state.   Nutrition for the Pulmonary Patient Group instruction provided by PowerPoint slides, verbal discussion, and written materials to support subject matter. The instructor gives an explanation and review of healthy diet recommendations, which includes a discussion on weight management, recommendations for fruit and vegetable consumption, as well as protein, fluid, caffeine, fiber, sodium, sugar, and alcohol. Tips for eating when patients are short of breath are discussed.    Other Education Group or individual verbal, written, or video instructions that support the educational goals of the pulmonary rehab program.    Knowledge Questionnaire Score:  Knowledge Questionnaire Score - 12/09/22 1000       Knowledge Questionnaire Score   Pre Score 16/18             Core Components/Risk Factors/Patient Goals at Admission:  Personal Goals and Risk Factors at Admission - 12/09/22 0939       Core Components/Risk Factors/Patient Goals on Admission    Weight Management Weight Loss;Yes    Intervention Weight Management: Develop a combined nutrition and exercise program  designed to reach desired caloric intake, while maintaining appropriate intake of nutrient and fiber, sodium and fats, and appropriate energy expenditure required for the weight goal.;Weight Management: Provide education and appropriate resources to help participant work on and attain dietary goals.;Weight Management/Obesity: Establish reasonable short term and long term weight goals.;Obesity: Provide education and appropriate resources to help participant work on and attain dietary goals.    Admit Weight 271 lb 9.7 oz (123.2 kg)    Expected Outcomes Short Term: Continue to assess and modify interventions until short term weight is achieved;Long Term: Adherence to nutrition and physical activity/exercise program aimed toward attainment of established weight goal;Weight Maintenance: Understanding of the daily nutrition guidelines, which includes 25-35% calories from fat, 7% or less cal from saturated fats, less than 287m cholesterol, less than 1.5gm of sodium, & 5 or more servings of fruits and vegetables daily;Weight Loss: Understanding of general recommendations for a balanced deficit meal plan, which promotes 1-2 lb weight loss per week and includes a negative energy balance of [phone] kcal/d;Understanding recommendations for meals to include 15-35% energy as protein, 25-35% energy from fat, 35-60% energy from carbohydrates, less than 2076mof dietary cholesterol, 20-35 gm of total fiber daily;Understanding of  distribution of calorie intake throughout the day with the consumption of 4-5 meals/snacks    Improve shortness of breath with ADL's Yes    Intervention Provide education, individualized exercise plan and daily activity instruction to help decrease symptoms of SOB with activities of daily living.    Expected Outcomes Short Term: Improve cardiorespiratory fitness to achieve a reduction of symptoms when performing ADLs;Long Term: Be able to perform more ADLs without symptoms or delay the onset of  symptoms             Core Components/Risk Factors/Patient Goals Review:    Core Components/Risk Factors/Patient Goals at Discharge (Final Review):    ITP Comments: Dr. Rodman Pickle is Medical Director for Pulmonary Rehab at [facility].

## 2022-12-09 NOTE — Progress Notes (Signed)
Pulmonary Rehab Orientation Physical Assessment Note  Physical assessment reveals patient is alert and oriented x 4.  Heart rate is normal, breath sounds clear to auscultation, no wheezes, rales, or rhonchi. Patient reports non-productive cough. Bowel sounds present in all 4 quads.  Pt denies abdominal discomfort, nausea, vomiting or diarrhea. Grip strength equal, strong. Distal pulses +2; +1 BLE swelling, pt wearing compression stockings.   Janine Ores, RN, BSN

## 2022-12-11 ENCOUNTER — Ambulatory Visit: Payer: BC Managed Care – PPO | Admitting: Physical Therapy

## 2022-12-11 ENCOUNTER — Ambulatory Visit: Payer: BC Managed Care – PPO | Admitting: Cardiology

## 2022-12-11 DIAGNOSIS — R5381 Other malaise: Secondary | ICD-10-CM | POA: Diagnosis not present

## 2022-12-11 DIAGNOSIS — M6281 Muscle weakness (generalized): Secondary | ICD-10-CM | POA: Diagnosis not present

## 2022-12-11 DIAGNOSIS — R2689 Other abnormalities of gait and mobility: Secondary | ICD-10-CM

## 2022-12-11 DIAGNOSIS — J9859 Other diseases of mediastinum, not elsewhere classified: Secondary | ICD-10-CM | POA: Diagnosis not present

## 2022-12-11 NOTE — Therapy (Signed)
OUTPATIENT PHYSICAL THERAPY NEURO TREATMENT   Patient Name: George Wallace MRN: [id number] DOB:[birth year], 62 y.o., male Today's Date: 12/11/2022   PCP: Cari Caraway, MD REFERRING PROVIDER: Magdalen Spatz, NP  END OF SESSION:  PT End of Session - 12/11/22 1022     Visit Number 2    Number of Visits 5   with eval   Date for PT Re-Evaluation 01/10/23    Authorization Type [employer]    PT Start Time 1020   pt checked in late   PT Stop Time 1058    PT Time Calculation (min) 38 min    Equipment Utilized During Treatment Gait belt    Activity Tolerance Patient tolerated treatment well    Behavior During Therapy WFL for tasks assessed/performed              Past Medical History:  Diagnosis Date   Diabetes ([place])    Diet controlled   Hyperlipidemia    Past Surgical History:  Procedure Laterality Date   INTERCOSTAL NERVE BLOCK  10/07/2022   Procedure: INTERCOSTAL NERVE BLOCK;  Surgeon: Lajuana Matte, MD;  Location: [facility];  Service: Thoracic;;   SINUS SURGERY WITH INSTATRAK     Patient Active Problem List   Diagnosis Date Noted   Acute respiratory failure ([place]) 10/07/2022   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   Type 2 diabetes mellitus without complication, without long-term current use of insulin ([place]) 10/07/2022   Hardening of the aorta (main artery of the heart) ([place]) 06/07/2022   Excessive sleepiness 06/07/2022   Hyperlipidemia, unspecified 06/07/2022   Inattention 06/07/2022   Obstructive sleep apnea (adult) (pediatric) 06/07/2022   Personal history of colonic polyps 06/07/2022   Type 2 diabetes mellitus with other specified complication ([place]) 99991111   Vitamin D deficiency 06/07/2022   Mediastinal mass 05/31/2022   Intermittent asthma without complication [id number]   Cardiomegaly 12/06/2014   Wheeze 12/06/2014   Morbid obesity ([place]) 12/06/2014    ONSET DATE: 11/25/2022  REFERRING DIAG: J98.59 (ICD-10-CM) - Mediastinal mass R53.81 (ICD-10-CM) -  Physical deconditioning  THERAPY DIAG:  Other abnormalities of gait and mobility  Muscle weakness (generalized)  Rationale for Evaluation and Treatment: Rehabilitation  SUBJECTIVE:                                                                                                                                                                                             SUBJECTIVE STATEMENT: Pt with ongoing chest tightness from sternal surgery. Pt had pulmonary rehab earlier this week, can go 2x/week for a group setting class. Pt continues to sleep in a recliner chair due to difficulty tolerating  laying down. Pt reports he is having some soreness down the L side of his neck.  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 Yes: NPRS scale: 6/10 Pain location: chest and abdomen from incisions Pain description: tightness, pulling Aggravating factors: lifting, certain movements Relieving factors: pain medication  PRECAUTIONS: Sternal  WEIGHT BEARING RESTRICTIONS: No  FALLS: Has patient fallen in last 6 months? 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 99-100% SpO2 at rest and with activity   DIAGNOSTIC FINDINGS:  Chest xray 10/29/2022 IMPRESSION: There are linear densities in left mid and both lower lung  fields suggesting scarring and subsegmental atelectasis. Left hemidiaphragm is elevated. No significant interval changes are noted since 10/18/2022  TODAY'S TREATMENT:                                                                                                                               THER ACT Treadmill x 10 min at 1.3 mph with 0% incline to focus on global endurance training. Pt covers 0.21 miles with an RPE of 1/10 following exercise. SpO2 100% on RA following activity.  Ascend/descend 4 x 6" steps x 6 repetitions with intermittent BUE support, RPE 2/10. Added in 4# B ankle weights for increased challenge. Ascend/descend 4 x 6" steps x 6 repetitions with use of ankle weights, RPE 2.5/10.  Ascend/descend ramp and 6" curb mod I with no evidence of challenge with this activity.  Ambulation x 345 ft with no AD and use of 4# ankle weights for increased challenge, followed by ambulation x 250 ft with removal of weights.  Pt on RA throughout session, SpO2 remains at 99% and higher.  Encouraged pt to increase time for walking program to 10 min each day on treadmill and to work on repetitions of ascending/descending stairs.   PATIENT EDUCATION: Education details: continue walking program with time progression, add stairs to HEP Person educated: Patient Education method: Customer service manager Education comprehension: verbalized understanding and returned demonstration  HOME EXERCISE PROGRAM: Initiated home walking program:  Walking Program: Walk with your family. Starting next week, walk 5 min per day for 7 days. Following week add 5 minutes to your total time. Week 1: 10 min Week 2: 15 min Week 3: 20 min Week 4: 25  min.Marland KitchenMarland KitchenMarland KitchenMarland KitchenUntil you can walk to 30 min per day   Ascend/descend your stairs x 6 repetitions each day (verbally added to HEP 2/14)   GOALS: Goals reviewed with patient? Yes  SHORT TERM GOALS=LONG TERM GOALS due to length of POC  LONG TERM GOALS: Target  date: 01/10/2023   Pt will be independent with final HEP for improved strength, balance, transfers and gait. Baseline: walking program initiated at eval (2/9) Goal status: INITIAL  2.  Pt will improve gait velocity to at least 3.5 ft/sec for improved gait efficiency and performance at mod I level  Baseline: 3.12 ft/sec (2/9) Goal status: INITIAL  3.  Pt will ambulate greater than or equal to 1800 feet on 6MWT with LRAD and mod I for improved cardiovascular endurance and BLE strength.  Baseline: 1169 ft no AD mod I (2/9) Goal status: INITIAL   ASSESSMENT:  CLINICAL IMPRESSION: Emphasis of skilled PT session on working on global endurance training with use of treadmill, stair navigation, and addition of ankle weights for increased challenge. Pt exhibits good tolerance for activity this date, does require occasional standing rest break due to tightness in chest from breathing that resolves with rest. Vitals WNL throughout session. Pt continues to benefit from skilled therapy services to address impaired endurance and decreased tolerance for functional activity. Continue POC.    OBJECTIVE IMPAIRMENTS: cardiopulmonary status limiting activity, decreased activity tolerance, decreased endurance, impaired perceived functional ability, and pain.   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   REHAB POTENTIAL: Good  CLINICAL DECISION MAKING: Stable/uncomplicated  EVALUATION COMPLEXITY: Low  PLAN:  PT FREQUENCY: 1x/week  PT DURATION: 4 weeks  PLANNED INTERVENTIONS: Therapeutic exercises,  Therapeutic activity, Neuromuscular re-education, Balance training, Gait training, Patient/Family education, Self Care, Joint mobilization, Stair training, DME instructions, Aquatic Therapy, Dry Needling, Electrical stimulation, Cryotherapy, Moist heat, Taping, Manual therapy, and Re-evaluation  PLAN FOR NEXT SESSION: work on endurance (treadmill, elliptical, SciFit) and other endurance activities within sternal precautions monitor SpO2; pt with poor tolerance for sitting on lower surfaces, work on walking up/down stairs, ramps, uneven surfaces outdoors; resisted forward/backward walking with bands around ankles   Excell Seltzer, PT, DPT, CSRS 12/11/2022, 10:59 AM

## 2022-12-11 NOTE — Progress Notes (Signed)
Pulmonary Individual Treatment Plan  Patient Details  Name: George Wallace MRN: [id number] Date of Birth: 06/26/1961 Referring Provider:   April Manson Pulmonary Rehab Walk Test from 12/09/2022 in [facility] [place]  Referring Provider [name]       Initial Encounter Date:  Flowsheet Row Pulmonary Rehab Walk Test from 12/09/2022 in [facility], V[facility], & [place]  Date 12/09/22       Visit Diagnosis: Shortness of breath  Patient's Home Medications on Admission:   Current Outpatient Medications:    Accu-Chek Softclix Lancets lancets, Use to check blood sugar daily, Disp: 100 each, Rfl: 1   acetaminophen (TYLENOL) 500 MG tablet, Take 1,000 mg by mouth every 6 (six) hours as needed for moderate pain., Disp: , Rfl:    albuterol (VENTOLIN HFA) 108 (90 Base) MCG/ACT inhaler, Inhale 2 puffs into the lungs every 4 (four) hours as needed for wheezing., Disp: 6.7 g, Rfl: 2   amiodarone (PACERONE) 200 MG tablet, Take 1 tablet (200 mg total) by mouth daily., Disp: 90 tablet, Rfl: 1   aspirin EC 325 MG tablet, Take 1 tablet (325 mg total) by mouth daily., Disp: , Rfl:    Blood Glucose Monitoring Suppl (ACCU-CHEK GUIDE) w/Device KIT, Use to check blood sugar daily, Disp: 1 kit, Rfl: 0   cetirizine (ZYRTEC) 10 MG tablet, Take 10 mg by mouth daily as needed for allergies., Disp: , Rfl:    docusate sodium (COLACE) 100 MG capsule, Take 100 mg by mouth 2 (two) times daily., Disp: , Rfl:    furosemide (LASIX) 40 MG tablet, Take 1 tablet (40 mg total) by mouth daily., Disp: 90 tablet, Rfl: 2   glucose blood (ACCU-CHEK GUIDE) test strip, Use to check blood sugar daily, Disp: 100 strip, Rfl: 1   ibuprofen (ADVIL) 200 MG tablet, Take 600 mg by mouth 3 (three) times daily., Disp: , Rfl:    lisinopril (ZESTRIL) 2.5 MG tablet, Take 1 tablet (2.5 mg total) by mouth daily., Disp: 30 tablet, Rfl: 1   lisinopril (ZESTRIL)  2.5 MG tablet, Take 1 tablet (2.5 mg total) by mouth daily., Disp: 90 tablet, Rfl: 1   metFORMIN (GLUCOPHAGE-XR) 500 MG 24 hr tablet, Take 500 mg by mouth every morning., Disp: , Rfl:    metoprolol succinate (TOPROL-XL) 25 MG 24 hr tablet, Take 3 tablets (75 mg total) by mouth daily., Disp: 90 tablet, Rfl: 1   potassium chloride SA (KLOR-CON M) 20 MEQ tablet, Take 1 tablet (20 mEq total) by mouth daily., Disp: 90 tablet, Rfl: 2   rosuvastatin (CRESTOR) 10 MG tablet, Take 1 tablet (10 mg total) by mouth daily., Disp: 90 tablet, Rfl: 0   Spacer/Aero-Holding Chambers (AEROCHAMBER PLUS FLO-VU LARGE) MISC, 1 each by Other route once., Disp: 1 each, Rfl: 0   tamsulosin (FLOMAX) 0.4 MG CAPS capsule, Take 1 capsule (0.4 mg total) by mouth daily., Disp: 30 capsule, Rfl: 1   tamsulosin (FLOMAX) 0.4 MG CAPS capsule, Take 1 capsule (0.4 mg total) by mouth daily., Disp: 30 capsule, Rfl: 2   tamsulosin (FLOMAX) 0.4 MG CAPS capsule, Take 1 capsule (0.4 mg total) by mouth daily., Disp: 90 capsule, Rfl: 1   traMADol (ULTRAM) 50 MG tablet, Take 1 tablet (50 mg total) by mouth every 6 (six) hours as needed., Disp: 28 tablet, Rfl: 0   traMADol (ULTRAM) 50 MG tablet, Take 1 tablet (50 mg total) by mouth every 6 (six) hours as  needed for pain., Disp: 28 tablet, Rfl: 0  Past Medical History: Past Medical History:  Diagnosis Date   Diabetes ([place])    Diet controlled   Hyperlipidemia     Tobacco Use: Social History   Tobacco Use  Smoking Status Never  Smokeless Tobacco Never  Tobacco Comments   both parents smoked in home growing up.     Labs: Review Flowsheet       Latest Ref Rng & Units 02/15/2015 10/03/2022 10/07/2022 10/18/2022  Labs for ITP Cardiac and Pulmonary Rehab  Hemoglobin A1c 4.8 - 5.6 % - 6.9  - -  PH, Arterial 7.35 - 7.45 - - 7.277  7.284  7.295  7.277  7.199  7.46   PCO2 arterial 32 - 48 mmHg - - 44.1  43.0  42.4  42.8  56.8  41   Bicarbonate 20.0 - 28.0 mmol/L - - 20.9  20.4  20.6  19.9   22.1  29.2   TCO2 22 - 32 mmol/L 22 - 32 mmol/L 23  - 22  23  22  22  21  24  $ -  Acid-base deficit 0.0 - 2.0 mmol/L - - 6.0  6.0  6.0  7.0  6.0  -  O2 Saturation % - - 99  100  100  100  93  95.6     Capillary Blood Glucose: Lab Results  Component Value Date   GLUCAP 102 (H) 12/09/2022   GLUCAP 115 (H) 10/22/2022   GLUCAP 106 (H) 10/22/2022   GLUCAP 95 10/22/2022   GLUCAP 149 (H) 10/21/2022     Pulmonary Assessment Scores:  Pulmonary Assessment Scores     Row Name 12/09/22 0934         ADL UCSD   ADL Phase Entry     SOB Score total 4       CAT Score   CAT Score 13       mMRC Score   mMRC Score 1             UCSD: Self-administered rating of dyspnea associated with activities of daily living (ADLs) 6-point scale (0 = "not at all" to 5 = "maximal or unable to do because of breathlessness")  Scoring Scores range from 0 to 120.  Minimally important difference is 5 units  CAT: CAT can identify the health impairment of COPD patients and is better correlated with disease progression.  CAT has a scoring range of zero to 40. The CAT score is classified into four groups of low (less than 10), medium (10 - 20), high (21-30) and very high (31-40) based on the impact level of disease on health status. A CAT score over 10 suggests significant symptoms.  A worsening CAT score could be explained by an exacerbation, poor medication adherence, poor inhaler technique, or progression of COPD or comorbid conditions.  CAT MCID is 2 points  mMRC: mMRC (Modified Medical Research Council) Dyspnea Scale is used to assess the degree of baseline functional disability in patients of respiratory disease due to dyspnea. No minimal important difference is established. A decrease in score of 1 point or greater is considered a positive change.   Pulmonary Function Assessment:  Pulmonary Function Assessment - 12/09/22 0934       Breath   Shortness of Breath Yes;Panic with Shortness of  Breath;Limiting activity             Exercise Target Goals: Exercise Program Goal: Individual exercise prescription set using results from initial 6  min walk test and THRR while considering  patient's activity barriers and safety.   Exercise Prescription Goal: Initial exercise prescription builds to 30-45 minutes a day of aerobic activity, 2-3 days per week.  Home exercise guidelines will be given to patient during program as part of exercise prescription that the participant will acknowledge.  Activity Barriers & Risk Stratification:  Activity Barriers & Cardiac Risk Stratification - 12/09/22 0935       Activity Barriers & Cardiac Risk Stratification   Activity Barriers Deconditioning;Muscular Weakness;Shortness of Breath;Assistive Device   paralyzed diaphragm            6 Minute Walk:  6 Minute Walk     Row Name 12/09/22 1035         6 Minute Walk   Phase Initial     Distance 960 feet     Walk Time 6 minutes     # of Rest Breaks 0     MPH 1.82     METS 2.07     RPE 7     Perceived Dyspnea  0.5     VO2 Peak 7.23     Symptoms No     Resting HR 64 bpm     Resting BP 108/68     Resting Oxygen Saturation  100 %     Exercise Oxygen Saturation  during 6 min walk 92 %     Max Ex. HR 96 bpm     Max Ex. BP 122/66     2 Minute Post BP 120/62       Interval HR   1 Minute HR 69     2 Minute HR 80     3 Minute HR 80     4 Minute HR 96     5 Minute HR 88     6 Minute HR 93     2 Minute Post HR 62     Interval Heart Rate? Yes       Interval Oxygen   Interval Oxygen? Yes     Baseline Oxygen Saturation % 100 %     1 Minute Oxygen Saturation % 94 %     1 Minute Liters of Oxygen 0 L     2 Minute Oxygen Saturation % 94 %     2 Minute Liters of Oxygen 0 L     3 Minute Oxygen Saturation % 99 %     3 Minute Liters of Oxygen 0 L     4 Minute Oxygen Saturation % 94 %     4 Minute Liters of Oxygen 0 L     5 Minute Oxygen Saturation % 92 %     5 Minute Liters of  Oxygen 0 L     6 Minute Oxygen Saturation % 94 %     6 Minute Liters of Oxygen 0 L     2 Minute Post Oxygen Saturation % 99 %     2 Minute Post Liters of Oxygen 0 L              Oxygen Initial Assessment:  Oxygen Initial Assessment - 12/10/22 1653       Home Oxygen   Home Oxygen Device None    Sleep Oxygen Prescription BiPAP    Home Exercise Oxygen Prescription None    Home Resting Oxygen Prescription None      Initial 6 min Walk   Oxygen Used None      Program Oxygen Prescription  Program Oxygen Prescription None      Intervention   Short Term Goals To learn and understand importance of maintaining oxygen saturations>88%;To learn and understand importance of monitoring SPO2 with pulse oximeter and demonstrate accurate use of the pulse oximeter.;To learn and demonstrate proper pursed lip breathing techniques or other breathing techniques.     Long  Term Goals Verbalizes importance of monitoring SPO2 with pulse oximeter and return demonstration;Maintenance of O2 saturations>88%;Exhibits proper breathing techniques, such as pursed lip breathing or other method taught during program session;Compliance with respiratory medication;Demonstrates proper use of MDI's             Oxygen Re-Evaluation:  Oxygen Re-Evaluation     Row Name 12/10/22 1653             Goals/Expected Outcomes   Goals/Expected Outcomes Compliance and understanding of oxygen saturation monitoring and breathing techniques to decrease shortness of breath.                Oxygen Discharge (Final Oxygen Re-Evaluation):  Oxygen Re-Evaluation - 12/10/22 1653       Goals/Expected Outcomes   Goals/Expected Outcomes Compliance and understanding of oxygen saturation monitoring and breathing techniques to decrease shortness of breath.             Initial Exercise Prescription:  Initial Exercise Prescription - 12/09/22 1000       Date of Initial Exercise RX and Referring Provider   Date  12/09/22    Referring Provider [name]    Expected Discharge Date 03/06/23      Recumbant Elliptical   Level 1    RPM 20    Minutes 15      Track   Minutes 15    METs 2.07      Prescription Details   Frequency (times per week) 2    Duration Progress to 30 minutes of continuous aerobic without signs/symptoms of physical distress      Intensity   THRR 40-80% of Max Heartrate 81-129    Ratings of Perceived Exertion 11-13    Perceived Dyspnea 0-4      Progression   Progression Continue to progress workloads to maintain intensity without signs/symptoms of physical distress.      Resistance Training   Training Prescription Yes    Weight blue bands    Reps 10-15             Perform Capillary Blood Glucose checks as needed.  Exercise Prescription Changes:   Exercise Comments:   Exercise Goals and Review:   Exercise Goals     Row Name 12/09/22 0935 12/10/22 1651           Exercise Goals   Increase Physical Activity Yes Yes      Intervention Provide advice, education, support and counseling about physical activity/exercise needs.;Develop an individualized exercise prescription for aerobic and resistive training based on initial evaluation findings, risk stratification, comorbidities and participant's personal goals. Provide advice, education, support and counseling about physical activity/exercise needs.;Develop an individualized exercise prescription for aerobic and resistive training based on initial evaluation findings, risk stratification, comorbidities and participant's personal goals.      Expected Outcomes Short Term: Attend rehab on a regular basis to increase amount of physical activity.;Long Term: Add in home exercise to make exercise part of routine and to increase amount of physical activity.;Long Term: Exercising regularly at least 3-5 days a week. Short Term: Attend rehab on a regular basis to increase amount of physical activity.;Long Term: Add in  home  exercise to make exercise part of routine and to increase amount of physical activity.;Long Term: Exercising regularly at least 3-5 days a week.      Increase Strength and Stamina Yes Yes      Intervention Provide advice, education, support and counseling about physical activity/exercise needs.;Develop an individualized exercise prescription for aerobic and resistive training based on initial evaluation findings, risk stratification, comorbidities and participant's personal goals. Provide advice, education, support and counseling about physical activity/exercise needs.;Develop an individualized exercise prescription for aerobic and resistive training based on initial evaluation findings, risk stratification, comorbidities and participant's personal goals.      Expected Outcomes Short Term: Increase workloads from initial exercise prescription for resistance, speed, and METs.;Short Term: Perform resistance training exercises routinely during rehab and add in resistance training at home;Long Term: Improve cardiorespiratory fitness, muscular endurance and strength as measured by increased METs and functional capacity (6MWT) Short Term: Increase workloads from initial exercise prescription for resistance, speed, and METs.;Short Term: Perform resistance training exercises routinely during rehab and add in resistance training at home;Long Term: Improve cardiorespiratory fitness, muscular endurance and strength as measured by increased METs and functional capacity (6MWT)      Able to understand and use rate of perceived exertion (RPE) scale Yes Yes      Intervention Provide education and explanation on how to use RPE scale Provide education and explanation on how to use RPE scale      Expected Outcomes Short Term: Able to use RPE daily in rehab to express subjective intensity level;Long Term:  Able to use RPE to guide intensity level when exercising independently Short Term: Able to use RPE daily in rehab to express  subjective intensity level;Long Term:  Able to use RPE to guide intensity level when exercising independently      Able to understand and use Dyspnea scale Yes Yes      Intervention Provide education and explanation on how to use Dyspnea scale Provide education and explanation on how to use Dyspnea scale      Expected Outcomes Short Term: Able to use Dyspnea scale daily in rehab to express subjective sense of shortness of breath during exertion;Long Term: Able to use Dyspnea scale to guide intensity level when exercising independently Short Term: Able to use Dyspnea scale daily in rehab to express subjective sense of shortness of breath during exertion;Long Term: Able to use Dyspnea scale to guide intensity level when exercising independently      Knowledge and understanding of Target Heart Rate Range (THRR) Yes Yes      Intervention Provide education and explanation of THRR including how the numbers were predicted and where they are located for reference Provide education and explanation of THRR including how the numbers were predicted and where they are located for reference      Expected Outcomes Short Term: Able to state/look up THRR;Long Term: Able to use THRR to govern intensity when exercising independently;Short Term: Able to use daily as guideline for intensity in rehab Short Term: Able to state/look up THRR;Long Term: Able to use THRR to govern intensity when exercising independently;Short Term: Able to use daily as guideline for intensity in rehab      Understanding of Exercise Prescription Yes Yes      Intervention Provide education, explanation, and written materials on patient's individual exercise prescription Provide education, explanation, and written materials on patient's individual exercise prescription      Expected Outcomes Short Term: Able to explain program exercise prescription;Long Term:  Able to explain home exercise prescription to exercise independently Short Term: Able to  explain program exercise prescription;Long Term: Able to explain home exercise prescription to exercise independently               Exercise Goals Re-Evaluation :  Exercise Goals Re-Evaluation     [place] Name 12/10/22 1651             Exercise Goal Re-Evaluation   Exercise Goals Review Increase Physical Activity;Able to understand and use Dyspnea scale;Understanding of Exercise Prescription;Increase Strength and Stamina;Knowledge and understanding of Target Heart Rate Range (THRR);Able to understand and use rate of perceived exertion (RPE) scale       Comments Kaylan is scheduled to begin exercise this week. Will continue to monitor and progress as able.       Expected Outcomes Through exercise at rehab and home, the patient will decrease shortness of breath with daily activities and feel confident in carrying out an exercise regimen at home.                Discharge Exercise Prescription (Final Exercise Prescription Changes):   Nutrition:  Target Goals: Understanding of nutrition guidelines, daily intake of sodium <154m, cholesterol <2048m calories 30% from fat and 7% or less from saturated fats, daily to have 5 or more servings of fruits and vegetables.  Biometrics:  Pre Biometrics - 12/09/22 0927       Pre Biometrics   Grip Strength 31 kg              Nutrition Therapy Plan and Nutrition Goals:   Nutrition Assessments:  MEDIFICTS Score Key: ?70 Need to make dietary changes  40-70 Heart Healthy Diet ? 40 Therapeutic Level Cholesterol Diet   Picture Your Plate Scores: <[id number] dietary pattern with much room for improvement. 41-50 Dietary pattern unlikely to meet recommendations for good health and room for improvement. 51-60 More healthful dietary pattern, with some room for improvement.  >60 Healthy dietary pattern, although there may be some specific behaviors that could be improved.    Nutrition Goals Re-Evaluation:   Nutrition Goals  Discharge (Final Nutrition Goals Re-Evaluation):   Psychosocial: Target Goals: Acknowledge presence or absence of significant depression and/or stress, maximize coping skills, provide positive support system. Participant is able to verbalize types and ability to use techniques and skills needed for reducing stress and depression.  Initial Review & Psychosocial Screening:  Initial Psych Review & Screening - 12/09/22 0937       Initial Review   Current issues with Current Sleep Concerns    Comments Pt has hard time going to sleep      Family Dynamics   Good Support System? Yes    Comments wife and friends      Barriers   Psychosocial barriers to participate in program The patient should benefit from training in stress management and relaxation.      Screening Interventions   Interventions Encouraged to exercise;To provide support and resources with identified psychosocial needs    Expected Outcomes Long Term Goal: Stressors or current issues are controlled or eliminated.             Quality of Life Scores:  Scores of 19 and below usually indicate a poorer quality of life in these areas.  A difference of  2-3 points is a clinically meaningful difference.  A difference of 2-3 points in the total score of the Quality of Life Index has been associated with significant improvement in overall quality  of life, self-image, physical symptoms, and general health in studies assessing change in quality of life.  PHQ-9: Review Flowsheet       12/09/2022  Depression screen PHQ 2/9  Decreased Interest 0  Down, Depressed, Hopeless 0  PHQ - 2 Score 0  Altered sleeping 0  Tired, decreased energy 0  Change in appetite 0  Feeling bad or failure about yourself  0  Trouble concentrating 0  Moving slowly or fidgety/restless 0  Suicidal thoughts 0  PHQ-9 Score 0  Difficult doing work/chores Not difficult at all   Interpretation of Total Score  Total Score Depression Severity:  1-4 =  Minimal depression, 5-9 = Mild depression, 10-14 = Moderate depression, 15-19 = Moderately severe depression, 20-27 = Severe depression   Psychosocial Evaluation and Intervention:  Psychosocial Evaluation - 12/09/22 1056       Psychosocial Evaluation & Interventions   Interventions Stress management education;Relaxation education;Encouraged to exercise with the program and follow exercise prescription    Comments Pt denies anxiety or depression. He admits he has a hard time sleeping.    Expected Outcomes For Kenneith to participate in PR free of psychosocial barriers    Continue Psychosocial Services  Follow up required by staff             Psychosocial Re-Evaluation:  Psychosocial Re-Evaluation     [place] Name 12/09/22 1251             Psychosocial Re-Evaluation   Current issues with Current Sleep Concerns       Comments Zigmond completed his orientation 2/12 and his first class is scheduled for 2/20.       Expected Outcomes For Mong to participate in PR and get better sleep with increased exercise.       Interventions Encouraged to attend Pulmonary Rehabilitation for the exercise;Relaxation education       Continue Psychosocial Services  Follow up required by staff  We will continue to monitor and assess Christyan for sleep concerns                Psychosocial Discharge (Final Psychosocial Re-Evaluation):  Psychosocial Re-Evaluation - 12/09/22 1251       Psychosocial Re-Evaluation   Current issues with Current Sleep Concerns    Comments Lonza completed his orientation 2/12 and his first class is scheduled for 2/20.    Expected Outcomes For Royzell to participate in PR and get better sleep with increased exercise.    Interventions Encouraged to attend Pulmonary Rehabilitation for the exercise;Relaxation education    Continue Psychosocial Services  Follow up required by staff   We will continue to monitor and assess Keaghan for sleep concerns             Education: Education Goals: Education classes will be provided on a weekly basis, covering required topics. Participant will state understanding/return demonstration of topics presented.  Learning Barriers/Preferences:  Learning Barriers/Preferences - 12/09/22 0939       Learning Barriers/Preferences   Learning Barriers None    Learning Preferences Pictoral;Skilled Demonstration;Video;Written Material;Computer/Internet             Education Topics: Introduction to Pulmonary Rehab Group instruction provided by PowerPoint, verbal discussion, and written material to support subject matter. Instructor reviews what Pulmonary Rehab is, the purpose of the program, and how patients are referred.     Know Your Numbers Group instruction that is supported by a PowerPoint presentation. Instructor discusses importance of knowing and understanding resting, exercise, and post-exercise oxygen saturation,  heart rate, and blood pressure. Oxygen saturation, heart rate, blood pressure, rating of perceived exertion, and dyspnea are reviewed along with a normal range for these values.    Exercise for the Pulmonary Patient Group instruction that is supported by a PowerPoint presentation. Instructor discusses benefits of exercise, core components of exercise, frequency, duration, and intensity of an exercise routine, importance of utilizing pulse oximetry during exercise, safety while exercising, and options of places to exercise outside of rehab.    MET Level  Group instruction provided by PowerPoint, verbal discussion, and written material to support subject matter. Instructor reviews what METs are and how to increase METs.    Pulmonary Medications Verbally interactive group education provided by instructor with focus on inhaled medications and proper administration.   Anatomy and Physiology of the Respiratory System Group instruction provided by PowerPoint, verbal discussion, and written  material to support subject matter. Instructor reviews respiratory cycle and anatomical components of the respiratory system and their functions. Instructor also reviews differences in obstructive and restrictive respiratory diseases with examples of each.    Oxygen Safety Group instruction provided by PowerPoint, verbal discussion, and written material to support subject matter. There is an overview of "What is Oxygen" and "Why do we need it".  Instructor also reviews how to create a safe environment for oxygen use, the importance of using oxygen as prescribed, and the risks of noncompliance. There is a brief discussion on traveling with oxygen and resources the patient may utilize.   Oxygen Use Group instruction provided by PowerPoint, verbal discussion, and written material to discuss how supplemental oxygen is prescribed and different types of oxygen supply systems. Resources for more information are provided.    Breathing Techniques Group instruction that is supported by demonstration and informational handouts. Instructor discusses the benefits of pursed lip and diaphragmatic breathing and detailed demonstration on how to perform both.     Risk Factor Reduction Group instruction that is supported by a PowerPoint presentation. Instructor discusses the definition of a risk factor, different risk factors for pulmonary disease, and how the heart and lungs work together.   MD Day A group question and answer session with a medical doctor that allows participants to ask questions that relate to their pulmonary disease state.   Nutrition for the Pulmonary Patient Group instruction provided by PowerPoint slides, verbal discussion, and written materials to support subject matter. The instructor gives an explanation and review of healthy diet recommendations, which includes a discussion on weight management, recommendations for fruit and vegetable consumption, as well as protein, fluid, caffeine,  fiber, sodium, sugar, and alcohol. Tips for eating when patients are short of breath are discussed.    Other Education Group or individual verbal, written, or video instructions that support the educational goals of the pulmonary rehab program.    Knowledge Questionnaire Score:  Knowledge Questionnaire Score - 12/09/22 1000       Knowledge Questionnaire Score   Pre Score 16/18             Core Components/Risk Factors/Patient Goals at Admission:  Personal Goals and Risk Factors at Admission - 12/09/22 0939       Core Components/Risk Factors/Patient Goals on Admission    Weight Management Weight Loss;Yes    Intervention Weight Management: Develop a combined nutrition and exercise program designed to reach desired caloric intake, while maintaining appropriate intake of nutrient and fiber, sodium and fats, and appropriate energy expenditure required for the weight goal.;Weight Management: Provide education and appropriate resources to  help participant work on and attain dietary goals.;Weight Management/Obesity: Establish reasonable short term and long term weight goals.;Obesity: Provide education and appropriate resources to help participant work on and attain dietary goals.    Admit Weight 271 lb 9.7 oz (123.2 kg)    Expected Outcomes Short Term: Continue to assess and modify interventions until short term weight is achieved;Long Term: Adherence to nutrition and physical activity/exercise program aimed toward attainment of established weight goal;Weight Maintenance: Understanding of the daily nutrition guidelines, which includes 25-35% calories from fat, 7% or less cal from saturated fats, less than 272m cholesterol, less than 1.5gm of sodium, & 5 or more servings of fruits and vegetables daily;Weight Loss: Understanding of general recommendations for a balanced deficit meal plan, which promotes 1-2 lb weight loss per week and includes a negative energy balance of [phone]  kcal/d;Understanding recommendations for meals to include 15-35% energy as protein, 25-35% energy from fat, 35-60% energy from carbohydrates, less than 2019mof dietary cholesterol, 20-35 gm of total fiber daily;Understanding of distribution of calorie intake throughout the day with the consumption of 4-5 meals/snacks    Improve shortness of breath with ADL's Yes    Intervention Provide education, individualized exercise plan and daily activity instruction to help decrease symptoms of SOB with activities of daily living.    Expected Outcomes Short Term: Improve cardiorespiratory fitness to achieve a reduction of symptoms when performing ADLs;Long Term: Be able to perform more ADLs without symptoms or delay the onset of symptoms             Core Components/Risk Factors/Patient Goals Review:   Goals and Risk Factor Review     Row Name 12/09/22 1254             Core Components/Risk Factors/Patient Goals Review   Personal Goals Review Weight Management/Obesity;Improve shortness of breath with ADL's;Develop more efficient breathing techniques such as purse lipped breathing and diaphragmatic breathing and practicing self-pacing with activity.       Review DeLennys scheduled to start the program on 12/17/22       Expected Outcomes See Admission Goals                Core Components/Risk Factors/Patient Goals at Discharge (Final Review):   Goals and Risk Factor Review - 12/09/22 1254       Core Components/Risk Factors/Patient Goals Review   Personal Goals Review Weight Management/Obesity;Improve shortness of breath with ADL's;Develop more efficient breathing techniques such as purse lipped breathing and diaphragmatic breathing and practicing self-pacing with activity.    Review DeBenedikts scheduled to start the program on 12/17/22    Expected Outcomes See Admission Goals             ITP Comments:   Comments: Dr. JaRodman Pickles Medical Director [facility] Pulmonary Re[facility] at [facility]

## 2022-12-13 ENCOUNTER — Ambulatory Visit (INDEPENDENT_AMBULATORY_CARE_PROVIDER_SITE_OTHER): Payer: Self-pay | Admitting: Thoracic Surgery (Cardiothoracic Vascular Surgery)

## 2022-12-13 ENCOUNTER — Encounter: Payer: Self-pay | Admitting: Thoracic Surgery (Cardiothoracic Vascular Surgery)

## 2022-12-13 VITALS — BP 125/78 | HR 87 | Resp 20 | Ht 69.0 in | Wt 267.0 lb

## 2022-12-13 DIAGNOSIS — J9859 Other diseases of mediastinum, not elsewhere classified: Secondary | ICD-10-CM

## 2022-12-13 NOTE — Progress Notes (Signed)
      [address]       [place] [address]             [phone]        George Wallace Medical Record [id number] Date of Birth: [birth year]  Referring: Cari Caraway, MD Primary Care: Cari Caraway, MD Primary Cardiologist:None  Reason for visit:   follow-up  History of Present Illness:     62 year old male presents for his 1 month follow-up appointment.  He is doing much better from a respiratory status.  He is able to walk around without much shortness of breath however if he exerts himself he does feel dyspneic.  He does occasionally use the CPAP.  Most of this seems somewhat related to his anxiety however.  Physical Exam: BP 125/78 (BP Location: Left Arm, Patient Position: Sitting, Cuff Size: Large)   Pulse 87   Resp 20   Ht 5' 9"$  (1.753 m)   Wt 267 lb (121.1 kg)   SpO2 95% Comment: RA  BMI 39.43 kg/m   Alert NAD Incision clean.  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  His respiratory status continues to improve slowly and he is enrolled in pulmonary rehab.  He is still not ready to return to work given his symptoms.  He is not quite ready to undergo plication.  He would like to get another opinion at a center that performs nerve grafting.  He will call us in the spring with this decision.  metoprolol Lajuana Matte 12/13/2022 10:25 AM

## 2022-12-16 ENCOUNTER — Ambulatory Visit: Payer: BC Managed Care – PPO | Admitting: Physical Therapy

## 2022-12-16 DIAGNOSIS — J9859 Other diseases of mediastinum, not elsewhere classified: Secondary | ICD-10-CM | POA: Diagnosis not present

## 2022-12-16 DIAGNOSIS — R2689 Other abnormalities of gait and mobility: Secondary | ICD-10-CM | POA: Diagnosis not present

## 2022-12-16 DIAGNOSIS — M6281 Muscle weakness (generalized): Secondary | ICD-10-CM

## 2022-12-16 DIAGNOSIS — R5381 Other malaise: Secondary | ICD-10-CM | POA: Diagnosis not present

## 2022-12-16 NOTE — Therapy (Signed)
OUTPATIENT PHYSICAL THERAPY NEURO TREATMENT   Patient Name: George Wallace MRN: [id number] DOB:[birth year], 62 y.o., male Today's Date: 12/16/2022   PCP: Cari Caraway, MD REFERRING PROVIDER: Magdalen Spatz, NP  END OF SESSION:  PT End of Session - 12/16/22 1320     Visit Number 3    Number of Visits 5   with eval   Date for PT Re-Evaluation 01/10/23    Authorization Type BCBS    PT Start Time 1320   pt arrived late   PT Stop Time 1358    PT Time Calculation (min) 38 min    Equipment Utilized During Treatment Gait belt    Activity Tolerance Patient tolerated treatment well    Behavior During Therapy WFL for tasks assessed/performed               Past Medical History:  Diagnosis Date   Diabetes ([place])    Diet controlled   Hyperlipidemia    Past Surgical History:  Procedure Laterality Date   INTERCOSTAL NERVE BLOCK  10/07/2022   Procedure: INTERCOSTAL NERVE BLOCK;  Surgeon: Lajuana Matte, MD;  Location: [place];  Service: Thoracic;;   SINUS SURGERY WITH INSTATRAK     Patient Active Problem List   Diagnosis Date Noted   Acute respiratory failure ([place]) 10/07/2022   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   Type 2 diabetes mellitus without complication, without long-term current use of insulin ([place]) 10/07/2022   Hardening of the aorta (main artery of the heart) ([place]) 06/07/2022   Excessive sleepiness 06/07/2022   Hyperlipidemia, unspecified 06/07/2022   Inattention 06/07/2022   Obstructive sleep apnea (adult) (pediatric) 06/07/2022   Personal history of colonic polyps 06/07/2022   Type 2 diabetes mellitus with other specified complication ([place]) [id number]   Vitamin D deficiency 06/07/2022   Mediastinal mass 05/31/2022   Intermittent asthma without complication [id number]   Cardiomegaly 12/06/2014   Wheeze 12/06/2014   Morbid obesity ([place]) 12/06/2014    ONSET DATE: 11/25/2022  REFERRING DIAG: J98.59 (ICD-10-CM) - Mediastinal mass R53.81 (ICD-10-CM) -  Physical deconditioning  THERAPY DIAG:  Muscle weakness (generalized)  Other abnormalities of gait and mobility  Rationale for Evaluation and Treatment: Rehabilitation  SUBJECTIVE:                                                                                                                                                                                             SUBJECTIVE STATEMENT: Pt reports no falls or acute changes since last visit. Pt with ongoing pain in L lateral portion of his neck, unsure if related to his phrenic nerve. Pt reports he saw his surgeon last  week and is going to get specific exercises for open heart patients to work on.  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 Yes: NPRS scale: 6/10 Pain location: chest and abdomen from incisions Pain description: tightness, pulling Aggravating factors: lifting, certain movements Relieving factors: pain medication  PRECAUTIONS: Sternal  WEIGHT BEARING RESTRICTIONS: No  FALLS: Has patient fallen in last 6 months? 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 99-100% SpO2 at rest and with activity   DIAGNOSTIC FINDINGS:  Chest xray 10/29/2022 IMPRESSION: There are linear densities in left mid and both lower lung fields suggesting scarring  and subsegmental atelectasis. Left hemidiaphragm is elevated. No significant interval changes are noted since 10/18/2022  TODAY'S TREATMENT:                                                                                                                               THER ACT Discussed return to work for patient and what all that would involve physically. Discussed being able to tolerate sitting for extended period of time, multiple transfers in/out of car, walking across uneven surfaces, up/down inclines, and up/down stairs as pt's job would involve all of these aspects of functional mobility. Also discussed energy conservation, pacing, etc as pt may fatigue more quickly with activity.  Ambulation on treadmill x 10 min at 1.2 mph with gradual progression up to 4% incline to focus on global endurance training. Pt covers 0.16 miles with an RPE of 1/10 following exercise.  Forward/backward resisted gait with GTB 3 x 10 ft each direction.  Forward/backward resisted monster walks with GTB 3 x 10 ft each direction.  Added to HEP, see bolded below.  Encouraged pt to continue time for walking program of 10 min each day on treadmill and to add in incline.   PATIENT EDUCATION: Education details: continue walking program with incline progression, add to HEP Person educated: Patient Education method: Explanation, Demonstration, and Handouts Education comprehension: verbalized understanding and returned demonstration  HOME EXERCISE PROGRAM: Initiated home walking program:  Walking Program: Walk with your family. Starting next week, walk 5 min per day for 7 days. Following week add 5 minutes to your total time. Week 1: 10 min Week 2: 15 min Week 3: 20 min Week 4: 25 min...Marland KitchenMarland KitchenUntil  you can walk to 30 min per day   Ascend/descend your stairs x 6 repetitions each day (verbally added to HEP 2/14)  Access Code: [email or web address] URL: [email or web address] Date: 12/16/2022 Prepared by:  Excell Seltzer  Exercises - Forward and Backward Monster Walk with Resistance at Ankles and Counter Support  - 1 x daily - 7 x weekly - 3 sets - 10 reps   GOALS: Goals reviewed with patient? Yes  SHORT TERM GOALS=LONG TERM GOALS due to length of POC  LONG TERM GOALS: Target date: 01/10/2023   Pt will be independent with final HEP for improved strength, balance, transfers and gait. Baseline: walking program initiated at eval (2/9) Goal status: INITIAL  2.  Pt will improve gait velocity to at least 3.5 ft/sec for improved gait efficiency and performance at mod I level  Baseline: 3.12 ft/sec (2/9) Goal status: INITIAL  3.  Pt will ambulate greater than or equal to 1800 feet on 6MWT with LRAD and mod I for improved cardiovascular endurance and BLE strength.  Baseline: 1169 ft no AD mod I (2/9) Goal status: INITIAL   ASSESSMENT:  CLINICAL IMPRESSION: Emphasis of skilled PT session on continuing to work on global endurance training, discussing what pt would need to work on to work towards being physically ready to return to work, and working on Automotive engineer and LE strengthening. Pt exhibits good tolerance for progression to gait with incline on treadmill this date. Pt does exhibit good challenge with addition of resisted monster walk exercise this date. Pt continues to benefit from skilled therapy services to address ongoing global weakness and impaired endurance due to phrenic nerve paralysis. Continue POC.    OBJECTIVE IMPAIRMENTS: cardiopulmonary status limiting activity, decreased activity tolerance, decreased endurance, impaired perceived functional ability, and pain.   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   REHAB POTENTIAL: Good  CLINICAL DECISION MAKING: Stable/uncomplicated  EVALUATION COMPLEXITY: Low  PLAN:  PT FREQUENCY: 1x/week  PT DURATION: 4 weeks  PLANNED INTERVENTIONS: Therapeutic exercises, Therapeutic activity, Neuromuscular re-education, Balance training, Gait training, Patient/Family education, Self Care, Joint mobilization, Stair training, DME instructions, Aquatic Therapy, Dry Needling, Electrical stimulation, Cryotherapy, Moist heat, Taping, Manual therapy, and Re-evaluation  PLAN FOR NEXT SESSION: work on endurance (treadmill, elliptical, SciFit) and other endurance activities within sternal precautions monitor SpO2; pt with poor tolerance for sitting on lower surfaces, circuits: walking up/down stairs, ramps, sit to stands, L neck stretches (UT, levator scap)   Excell Seltzer, PT, DPT, CSRS 12/16/2022, 1:59 PM

## 2022-12-17 ENCOUNTER — Other Ambulatory Visit (HOSPITAL_COMMUNITY): Payer: Self-pay

## 2022-12-17 ENCOUNTER — Encounter (HOSPITAL_COMMUNITY)
Admission: RE | Admit: 2022-12-17 | Discharge: 2022-12-17 | Disposition: A | Discharge status: Home / Self Care | Payer: BC Managed Care – PPO | Source: Ambulatory Visit | Attending: Internal Medicine | Admitting: Internal Medicine

## 2022-12-17 DIAGNOSIS — R0602 Shortness of breath: Secondary | ICD-10-CM | POA: Diagnosis not present

## 2022-12-17 LAB — GLUCOSE, CAPILLARY: Glucose-Capillary: 106 mg/dL — ABNORMAL HIGH (ref 70–99)

## 2022-12-17 NOTE — Progress Notes (Signed)
Daily Session Note  Patient Details  Name: George Wallace MRN: [id number] Date of Birth: [birth year] Referring Provider:   April Manson Pulmonary Rehab Walk Test from 12/09/2022 in [facility] [place]  Referring Provider [name]       Encounter Date: 12/17/2022  Check In:  Session Check In - 12/17/22 1148       Check-In   Supervising physician immediately available to respond to emergencies [facility] MD immediately available    Physician(s) Eric Form, NP    Location [facility]    Staff Present Janine Ores, RN, Quentin Ore, MS, ACSM-CEP, Exercise Physiologist;Randi Yevonne Pax, ACSM-CEP, Exercise Physiologist;Samantha [place], RD, LDN;Other    Virtual Visit No    Medication changes reported     No    Fall or balance concerns reported    No    Tobacco Cessation No Change    Warm-up and Cool-down Performed as group-led instruction    Resistance Training Performed Yes    VAD Patient? No    PAD/SET Patient? No      Pain Assessment   Currently in Pain? No/denies             Capillary Blood Glucose: Results for orders placed or performed during the hospital encounter of 12/17/22 (from the past 24 hour(s))  Glucose, capillary     Status: Abnormal   Collection Time: 12/17/22 11:35 AM  Result Value Ref Range   Glucose-Capillary 106 (H) 70 - 99 mg/dL      Social History   Tobacco Use  Smoking Status Never  Smokeless Tobacco Never  Tobacco Comments   both parents smoked in home growing up.     Goals Met:  Proper associated with RPD/PD & O2 Sat Exercise tolerated well No report of concerns or symptoms today Strength training completed today  Goals Unmet:  Not Applicable  Comments: Service time is from 1014 to 1140.    Dr. Rodman Pickle is Medical Director for Pulmonary Rehab at [facility].

## 2022-12-19 ENCOUNTER — Encounter (HOSPITAL_COMMUNITY)
Admission: RE | Admit: 2022-12-19 | Discharge: 2022-12-19 | Disposition: A | Discharge status: Home / Self Care | Payer: BC Managed Care – PPO | Source: Ambulatory Visit | Attending: Internal Medicine | Admitting: Internal Medicine

## 2022-12-19 DIAGNOSIS — J986 Disorders of diaphragm: Secondary | ICD-10-CM | POA: Diagnosis not present

## 2022-12-19 DIAGNOSIS — I700 Atherosclerosis of aorta: Secondary | ICD-10-CM | POA: Diagnosis not present

## 2022-12-19 DIAGNOSIS — E119 Type 2 diabetes mellitus without complications: Secondary | ICD-10-CM | POA: Diagnosis not present

## 2022-12-19 DIAGNOSIS — J9600 Acute respiratory failure, unspecified whether with hypoxia or hypercapnia: Secondary | ICD-10-CM | POA: Diagnosis not present

## 2022-12-19 DIAGNOSIS — R0602 Shortness of breath: Secondary | ICD-10-CM

## 2022-12-19 LAB — GLUCOSE, CAPILLARY: Glucose-Capillary: 111 mg/dL — ABNORMAL HIGH (ref 70–99)

## 2022-12-19 NOTE — Progress Notes (Deleted)
Cardiology Office Note:    Date:  12/19/2022   ID:  George Wallace, DOB Sep 02, 1961, MRN [id number]  PCP:  Cari Caraway, [place] Providers Cardiologist:  None {  Referring MD: Cari Caraway, MD     History of Present Illness:    George Wallace is a 62 y.o. 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  Per review of the chart, the patient was originally undergoing CT scan for Ca score and was noted to have a nodular mass in the thymus.PET/CT scan which showed avidity in the anterior mediastinal mass concerning for lymphoma. Biopsies at [facility] were nonconclusive. 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 Kipp Brood on 10/07/2022. Some of the left phrenic nerve was deeply invested into the tumor. It was taken en bloc resulting in left hemidiaphragm elevation. Now requires BiPAP.   Was seen in follow-up by Anselm Lis on 11/22/22. He was feeling better. He is planned for possible plication of his left hemidiaphragm in the future. TTE 10/2022 with LVEF 60-65%, normal RV, no significant valve disease. Was notably having significant LE edema at that visit prompting visit today.  Today, the patient states that he has been experiencing LE edema since his discharge from his hospitalization. Initially started in the ankles but has progressed to the thighs. Also has continued dyspnea on exertion that he thinks is slightly worse than a couple of weeks ago. Wears BiPAP at night and sleeps in a recliner to help with breathing following surgery. No lightheadedness, dizziness, or syncope. Continues to have chronic chest tightness following surgery which is not exertion related.   Admits that he has been having a lot of salt and  take out which may be contributing to symptoms.     Past Medical History:  Diagnosis Date   Diabetes ([place])    Diet controlled   Hyperlipidemia     Past Surgical History:  Procedure Laterality Date   INTERCOSTAL NERVE BLOCK  10/07/2022   Procedure: INTERCOSTAL NERVE BLOCK;  Surgeon: Lajuana Matte, MD;  Location: [facility] OR;  Service: Thoracic;;   SINUS SURGERY WITH INSTATRAK      Current Medications: No outpatient medications have been marked as taking for the 12/30/22 encounter (Appointment) with Freada Bergeron, MD.     Allergies:   Patient has no known allergies.   Social History   Socioeconomic History   Marital status: Married    Spouse name: Not on file   Number of children: 2   Years of education: Not on file   Highest education level: Not on file  Occupational History   Not on file  Tobacco Use   Smoking status: Never   Smokeless tobacco: Never   Tobacco comments:    both parents smoked in home growing up.   Vaping Use   Vaping Use: Never used  Substance and Sexual Activity   Alcohol use: No    Alcohol/week: 0.0 standard drinks of alcohol   Drug use: No   Sexual activity: Not on file  Other Topics Concern   Not on file  Social History Narrative   Not on file   Social Determinants of Health   Financial Resource Strain: Not on file  Food Insecurity: No Food Insecurity (10/07/2022)  Hunger Vital Sign    Worried About Running Out of Food in the Last Year: Never true    Ran Out of Food in the Last Year: Never true  Transportation Needs: No Transportation Needs (10/07/2022)   PRAPARE - Hydrologist (Medical): No    Lack of Transportation (Non-Medical): No  Physical Activity: Not on file  Stress: Not on file  Social Connections: Not on file     Family History: The patient's family history includes Emphysema in his father; Heart disease in an other family member.  ROS:   Please see the history of present illness.      All other systems reviewed and are negative.  EKGs/Labs/Other Studies Reviewed:    The following studies were reviewed today: Echo 11/20/2022 1. Left ventricular ejection fraction, by estimation, is 60 to 65%. The  left ventricle has normal function. The left ventricle has no regional  wall motion abnormalities. Left ventricular diastolic parameters were  normal.   2. Right ventricular systolic function is normal. The right ventricular  size is normal.   3. The mitral valve is normal in structure. No evidence of mitral valve  regurgitation. No evidence of mitral stenosis.   4. The aortic valve is normal in structure. Aortic valve regurgitation is  not visualized. No aortic stenosis is present.   5. The inferior vena cava is normal in size with greater than 50%  respiratory variability, suggesting right atrial pressure of 3 mmHg.  EKG:  EKG 10/08/22: NSR, no ischemic changes  Recent Labs: 10/03/2022: ALT 23 10/16/2022: Hemoglobin 11.5; Platelets 437 10/17/2022: Magnesium 2.3 11/27/2022: NT-Pro BNP <36 12/04/2022: BUN 10; Creatinine, Ser 1.04; Potassium 4.3; Sodium 142  Recent Lipid Panel No results found for: "CHOL", "TRIG", "HDL", "CHOLHDL", "VLDL", "LDLCALC", "LDLDIRECT"   Risk Assessment/Calculations:      No BP recorded.  {Refresh Note OR Click here to enter BP  :1}***         Physical Exam:    VS:  There were no vitals taken for this visit.    Wt Readings from Last 3 Encounters:  12/13/22 267 lb (121.1 kg)  12/09/22 271 lb 9.7 oz (123.2 kg)  11/27/22 286 lb 12.8 oz (130.1 kg)     GEN:  Well nourished, well developed in no acute distress HEENT: Normal NECK: No JVD; No carotid bruits CARDIAC: RRR, no murmurs, rubs, gallops RESPIRATORY:  Diminished at left lung base. Otherwise clear ABDOMEN: Soft, non-tender, non-distended MUSCULOSKELETAL:  2+ pitting edema to the knees. Warm SKIN: Warm and dry NEUROLOGIC:  Alert and oriented x 3 PSYCHIATRIC:  Normal affect    ASSESSMENT:    No diagnosis found.  PLAN:    In order of problems listed above:  #LE Edema: Has 2+ bilateral pitting edema on exam. TTE with LVEF 123456, normal diastolic function, normal filling pressures. No significant valve disease. Suspect he has venous insufficiency at baseline with acute worsening of symptoms in the setting of significant salt intake recently. Diastolic function normal on echo and BP well controlled. Will start lasix 76m PO daily and monitor response. Continue compression sock therapy and leg elevation as able. -Start lasix 422mdaily -Start K+ 2074mdaily with lasix -Check BNP today  -BMET next week -Continue compression socks and leg elevation as able -Low Na diet discussed at length  #Coronary Artery Ca: Ca score 4.31 which is 25% for age, gender, race matched controls. Will continue with medical management. -Continue crestor 78m12mily -Goal LDL<70  #  Post-op Atrial Fibrillation: Noted during admission for thymic mass removal. Has been maintained on amiodarone. Currently in NSR. -Decrease amiodarone to 225m daily -Check cardiac monitor to assess for recurrece  #Thymic Mass s/p Resection: #Left Hemidiaphragm: Patient with thymic mass requiring surgical removal. Mass was encasing the left phrenic nerve and it was removed en bloc. Now with left hemidiaphragm requiring BiPAP at night. Follows closely with Pulm and CT surgery.  #HTN: Well controlled and at goal <130/80. -Continue lisinopril 2.533mdaily -Continue metop 257mL daily           Medication Adjustments/Labs and Tests Ordered: Current medicines are reviewed at length with the patient today.  Concerns regarding medicines are outlined above.  No orders of the defined types were placed in this encounter.  No orders of the defined types were placed in this encounter.   There are no Patient Instructions on file for this visit.   Signed, HeaFreada BergeronD  12/19/2022 1:45 PM     [place]

## 2022-12-19 NOTE — Progress Notes (Signed)
Daily Session Note  Patient Details  Name: George Wallace MRN: [id number] Date of Birth: 1961/08/04 Referring Provider:   April Manson Pulmonary Rehab Walk Test from 12/09/2022 in [facility] for Heart, Vascular, & Lung [facility]  Referring Provider [name]       Encounter Date: 12/19/2022  Check In:  Session Check In - 12/19/22 1152       Check-In   Supervising physician immediately available to respond to emergencies [facility] MD immediately available    Physician(s) Dr Vaughan Browner    Location [facility]    Staff Present Janine Ores, RN, Quentin Ore, MS, ACSM-CEP, Exercise Physiologist;Randi Yevonne Pax, ACSM-CEP, Exercise Physiologist;Samantha [place], RD, LDN;Other    Virtual Visit No    Medication changes reported     No    Fall or balance concerns reported    No    Tobacco Cessation No Change    Warm-up and Cool-down Performed as group-led instruction    Resistance Training Performed Yes    VAD Patient? No    PAD/SET Patient? No      Pain Assessment   Currently in Pain? No/denies    Multiple Pain Sites No             Capillary Blood Glucose: Results for orders placed or performed during the hospital encounter of 12/19/22 (from the past 24 hour(s))  Glucose, capillary     Status: Abnormal   Collection Time: 12/19/22 11:44 AM  Result Value Ref Range   Glucose-Capillary 111 (H) 70 - 99 mg/dL    POCT Glucose - 12/19/22 1018       POCT Blood Glucose   Pre-Exercise 111 mg/dL              Social History   Tobacco Use  Smoking Status Never  Smokeless Tobacco Never  Tobacco Comments   both parents smoked in home growing up.     Goals Met:  Independence with exercise equipment Exercise tolerated well No report of concerns or symptoms today Strength training completed today  Goals Unmet:  Not Applicable  Comments: Service time is from 1018 to 1141    Dr. Rodman Pickle is Medical Director for Pulmonary Rehab at  [facility].

## 2022-12-21 ENCOUNTER — Other Ambulatory Visit: Payer: Self-pay | Admitting: Surgical

## 2022-12-21 DIAGNOSIS — Z79899 Other long term (current) drug therapy: Secondary | ICD-10-CM

## 2022-12-21 DIAGNOSIS — R931 Abnormal findings on diagnostic imaging of heart and coronary circulation: Secondary | ICD-10-CM

## 2022-12-21 DIAGNOSIS — I4891 Unspecified atrial fibrillation: Secondary | ICD-10-CM

## 2022-12-21 DIAGNOSIS — I700 Atherosclerosis of aorta: Secondary | ICD-10-CM

## 2022-12-21 DIAGNOSIS — R600 Localized edema: Secondary | ICD-10-CM

## 2022-12-23 ENCOUNTER — Ambulatory Visit: Payer: BC Managed Care – PPO | Admitting: Physical Therapy

## 2022-12-23 DIAGNOSIS — M6281 Muscle weakness (generalized): Secondary | ICD-10-CM

## 2022-12-23 DIAGNOSIS — J9859 Other diseases of mediastinum, not elsewhere classified: Secondary | ICD-10-CM | POA: Diagnosis not present

## 2022-12-23 DIAGNOSIS — R5381 Other malaise: Secondary | ICD-10-CM | POA: Diagnosis not present

## 2022-12-23 DIAGNOSIS — J9610 Chronic respiratory failure, unspecified whether with hypoxia or hypercapnia: Secondary | ICD-10-CM | POA: Diagnosis not present

## 2022-12-23 DIAGNOSIS — R2689 Other abnormalities of gait and mobility: Secondary | ICD-10-CM | POA: Diagnosis not present

## 2022-12-23 DIAGNOSIS — J984 Other disorders of lung: Secondary | ICD-10-CM | POA: Diagnosis not present

## 2022-12-23 NOTE — Therapy (Signed)
OUTPATIENT PHYSICAL THERAPY NEURO TREATMENT   Patient Name: George Wallace MRN: [id number] DOB:[birth year], 62 y.o., male 45 Date: 12/23/2022   PCP: Cari Caraway, MD REFERRING PROVIDER: Magdalen Spatz, NP  END OF SESSION:  PT End of Session - 12/23/22 1152     Visit Number 4    Number of Visits 5   with eval   Date for PT Re-Evaluation 01/10/23    Authorization Type BCBS    PT Start Time 1150   pt arrived late   PT Stop Time 44    PT Time Calculation (min) 40 min    Equipment Utilized During Treatment Gait belt    Activity Tolerance Patient tolerated treatment well    Behavior During Therapy WFL for tasks assessed/performed                Past Medical History:  Diagnosis Date   Diabetes ([place])    Diet controlled   Hyperlipidemia    Past Surgical History:  Procedure Laterality Date   INTERCOSTAL NERVE BLOCK  10/07/2022   Procedure: INTERCOSTAL NERVE BLOCK;  Surgeon: Lajuana Matte, MD;  Location: [facility];  Service: Thoracic;;   SINUS SURGERY WITH INSTATRAK     Patient Active Problem List   Diagnosis Date Noted   Acute respiratory failure ([place]) 10/07/2022   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   Type 2 diabetes mellitus without complication, without long-term current use of insulin ([place]) 10/07/2022   Hardening of the aorta (main artery of the heart) ([place]) 06/07/2022   Excessive sleepiness 06/07/2022   Hyperlipidemia, unspecified 06/07/2022   Inattention 06/07/2022   Obstructive sleep apnea (adult) (pediatric) 06/07/2022   Personal history of colonic polyps 06/07/2022   Type 2 diabetes mellitus with other specified complication ([place]) 99991111   Vitamin D deficiency 06/07/2022   Mediastinal mass 05/31/2022   Intermittent asthma without complication 123456   Cardiomegaly 12/06/2014   Wheeze 12/06/2014   Morbid obesity ([place]) 12/06/2014    ONSET DATE: 11/25/2022  REFERRING DIAG: J98.59 (ICD-10-CM) - Mediastinal mass R53.81 (ICD-10-CM) -  Physical deconditioning  THERAPY DIAG:  Muscle weakness (generalized)  Other abnormalities of gait and mobility  Rationale for Evaluation and Treatment: Rehabilitation  SUBJECTIVE:                                                                                                                                                                                             SUBJECTIVE STATEMENT: Pt reports no falls or acute changes since last visit. Pt with ongoing chest pain/soreness at site of incision, itchy. Pt also with intermittent L-sided neck pain. Pt was able to drive himself to/from  [place] over the weekend, did have to use his oxygen on the drive home due to difficulty breathing.  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 Yes: NPRS scale: 6/10 Pain location: chest and abdomen from incisions Pain description: tightness, pulling Aggravating factors: lifting, certain movements Relieving factors: pain medication  PRECAUTIONS: Sternal  WEIGHT BEARING RESTRICTIONS: No  FALLS: Has patient fallen in last 6 months? 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 99-100% SpO2 at rest and with activity   DIAGNOSTIC FINDINGS:  Chest xray 10/29/2022 IMPRESSION: There are linear densities in left mid and both  lower lung fields suggesting scarring and subsegmental atelectasis. Left hemidiaphragm is elevated. No significant interval changes are noted since 10/18/2022  TODAY'S TREATMENT:                                                                                                                               GAIT: Gait pattern: [facility] Distance walked: 2000 ft (+) Assistive device utilized: None Level of assistance: SBA Comments: gait outdoors across uneven ground, sidewalk, etc and up/down grassy hill to work on return to community mobility; pt does have some chest tightness following gait, resolves with standing rest break   THER EX: Resisted sit to stand 3 x 10 reps with GTB  Seated L UT stretch 2 x 30 sec Seated L levator scap stretch 2 x 30 sec  Added exercises to HEP  THER ACT: Sit to stand from 16" platform and soft, pliable surface x 10 reps   PATIENT EDUCATION: Education details: continue walking program with incline progression, added to HEP Person educated: Patient Education method: Consulting civil engineer, Demonstration, and Handouts Education comprehension: verbalized understanding and returned demonstration  HOME EXERCISE PROGRAM: Initiated home walking program:  Walking Program: Walk with your family. Starting next week, walk 5 min per day for 7 days. Following week add 5 minutes to your total time. Week 1: 10 min Week 2: 15 min Week 3: 20 min Week 4: 25 min...Marland KitchenMarland KitchenUntil you can walk to 30 min per day   Ascend/descend your stairs x 6 repetitions each day (verbally added to HEP 2/14)  Access Code: [email or web address] URL: [email or web address] Date: 12/16/2022 Prepared by: Excell Seltzer  Exercises - Forward and Backward Monster Walk with Resistance at Ankles and  Counter Support  - 1 x daily - 7 x weekly - 3 sets - 10 reps - Seated Upper Trapezius Stretch  - 1 x daily - 7 x weekly - 1 sets - 5 reps - 30 sec hold - Gentle Levator Scapulae Stretch  - 1 x daily - 7 x  weekly - 1 sets - 5 reps - 30 sec hold - Sit to Stand with Resistance Around Legs  - 1 x daily - 7 x weekly - 3 sets - 10 reps   GOALS: Goals reviewed with patient? Yes  SHORT TERM GOALS=LONG TERM GOALS due to length of POC  LONG TERM GOALS: Target date: 01/10/2023   Pt will be independent with final HEP for improved strength, balance, transfers and gait. Baseline: walking program initiated at eval (2/9) Goal status: INITIAL  2.  Pt will improve gait velocity to at least 3.5 ft/sec for improved gait efficiency and performance at mod I level  Baseline: 3.12 ft/sec (2/9) Goal status: INITIAL  3.  Pt will ambulate greater than or equal to 1800 feet on 6MWT with LRAD and mod I for improved cardiovascular endurance and BLE strength.  Baseline: 1169 ft no AD mod I (2/9) Goal status: INITIAL   ASSESSMENT:  CLINICAL IMPRESSION: Emphasis of skilled PT session on working on gait outdoors, across uneven ground, up/down inclines to focus on return to community mobility. Pt is able to ambulate 2000 ft (+) but does need a standing rest break due to increase in chest pain/tightness with exertion. Pt continues to remain challenged by sit to stands from lower height surfaces. Discussed PT POC with plan to d/c from OPPT after next session and pt to continue with pulmonary rehab and HEP. Pt in agreement with plan. Continue POC.   OBJECTIVE IMPAIRMENTS: cardiopulmonary status limiting activity, decreased activity tolerance, decreased endurance, impaired perceived functional ability, and pain.   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   REHAB POTENTIAL: Good  CLINICAL DECISION MAKING: Stable/uncomplicated  EVALUATION COMPLEXITY: Low  PLAN:  PT FREQUENCY: 1x/week  PT DURATION: 4 weeks  PLANNED INTERVENTIONS: Therapeutic exercises, Therapeutic activity, Neuromuscular re-education, Balance training, Gait training, Patient/Family education, Self Care, Joint mobilization, Stair training, DME instructions, Aquatic Therapy, Dry Needling, Electrical stimulation, Cryotherapy, Moist heat, Taping, Manual therapy, and Re-evaluation  PLAN FOR NEXT SESSION: assess LTG and d/c   Excell Seltzer, PT, DPT, CSRS 12/23/2022, 12:35 PM

## 2022-12-24 ENCOUNTER — Encounter (HOSPITAL_COMMUNITY)
Admission: RE | Admit: 2022-12-24 | Discharge: 2022-12-24 | Disposition: A | Discharge status: Home / Self Care | Payer: BC Managed Care – PPO | Source: Ambulatory Visit | Attending: Internal Medicine | Admitting: Internal Medicine

## 2022-12-24 VITALS — Wt 266.8 lb

## 2022-12-24 DIAGNOSIS — R0602 Shortness of breath: Secondary | ICD-10-CM

## 2022-12-24 NOTE — Progress Notes (Signed)
Daily Session Note  Patient Details  Name: Zakory Zucconi MRN: [id number] Date of Birth: 02/28/1961 Referring Provider:   April Manson Pulmonary Rehab Walk Test from 12/09/2022 in [facility] for Heart, Vascular, & [place]  Referring Provider [name]       Encounter Date: 12/24/2022  Check In:  Session Check In - 12/24/22 1115       Check-In   Supervising physician immediately available to respond to emergencies [facility] MD immediately available    Physician(s) Melina Copa, PA    Location [facility]    Staff Present Janine Ores, RN, Quentin Ore, MS, ACSM-CEP, Exercise Physiologist;Randi Yevonne Pax, ACSM-CEP, Exercise Physiologist;Samantha [place], RD, LDN;Other;Bailey Pearline Cables, MS, Exercise Physiologist    Virtual Visit No    Medication changes reported     No    Fall or balance concerns reported    No    Tobacco Cessation No Change    Warm-up and Cool-down Performed as group-led instruction    Resistance Training Performed Yes    VAD Patient? No    PAD/SET Patient? No      Pain Assessment   Currently in Pain? No/denies    Multiple Pain Sites No             Capillary Blood Glucose: No results found for this or any previous visit (from the past 24 hour(s)).   Exercise Prescription Changes - 12/24/22 1200       Response to Exercise   Blood Pressure (Admit) 112/70    Blood Pressure (Exercise) 140/70    Blood Pressure (Exit) 110/70    Heart Rate (Admit) 79 bpm    Heart Rate (Exercise) 107 bpm    Heart Rate (Exit) 85 bpm    Oxygen Saturation (Admit) 99 %    Oxygen Saturation (Exercise) 96 %    Oxygen Saturation (Exit) 99 %    Rating of Perceived Exertion (Exercise) 11    Perceived Dyspnea (Exercise) 1    Duration Continue with 30 min of aerobic exercise without signs/symptoms of physical distress.    Intensity THRR unchanged      Progression   Progression Continue to progress workloads to maintain intensity without  signs/symptoms of physical distress.      Resistance Training   Training Prescription Yes    Weight blue bands    Reps 10-15      Treadmill   MPH 2.5    Grade 1.5    Minutes 15    METs 3.43      NuStep   Level 2    Minutes 15    METs 1.6             Social History   Tobacco Use  Smoking Status Never  Smokeless Tobacco Never  Tobacco Comments   both parents smoked in home growing up.     Goals Met:  Independence with exercise equipment Queuing for purse lip breathing No report of concerns or symptoms today Strength training completed today  Goals Unmet:  Not Applicable  Comments: Service time is from 1021 to 1155    Dr. Rodman Pickle is Medical Director for Pulmonary Rehab at [facility].

## 2022-12-26 ENCOUNTER — Encounter (HOSPITAL_COMMUNITY)
Admission: RE | Admit: 2022-12-26 | Discharge: 2022-12-26 | Disposition: A | Discharge status: Home / Self Care | Payer: BC Managed Care – PPO | Source: Ambulatory Visit | Attending: Internal Medicine | Admitting: Internal Medicine

## 2022-12-26 DIAGNOSIS — R0602 Shortness of breath: Secondary | ICD-10-CM

## 2022-12-26 NOTE — Progress Notes (Signed)
Daily Session Note  Patient Details  Name: George Wallace MRN: [id number] Date of Birth: 1961-09-30 Referring Provider:   April Manson Pulmonary Rehab Walk Test from 12/09/2022 in [facility] for Heart, Vascular, & [place]  Referring Provider [name]       Encounter Date: 12/26/2022  Check In:  Session Check In - 12/26/22 1120       Check-In   Supervising physician immediately available to respond to emergencies [facility] - Physician supervision    Physician(s) Dr Cyd Silence    Location [facility]    Staff Present Janine Ores, RN, Quentin Ore, MS, ACSM-CEP, Exercise Physiologist;Shelvia Fojtik Yevonne Pax, ACSM-CEP, Exercise Physiologist;Samantha [place], RD, LDN;Other;Bailey Pearline Cables, MS, Exercise Physiologist    Virtual Visit No    Medication changes reported     No    Fall or balance concerns reported    No    Tobacco Cessation No Change    Warm-up and Cool-down Performed as group-led instruction    Resistance Training Performed Yes    VAD Patient? No    PAD/SET Patient? No      Pain Assessment   Currently in Pain? No/denies    Multiple Pain Sites No             Capillary Blood Glucose: No results found for this or any previous visit (from the past 24 hour(s)).    Social History   Tobacco Use  Smoking Status Never  Smokeless Tobacco Never  Tobacco Comments   both parents smoked in home growing up.     Goals Met:  Independence with exercise equipment Exercise tolerated well No report of concerns or symptoms today Strength training completed today  Goals Unmet:  Not Applicable  Comments: Service time is from 1014 to 1145.    Dr. Rodman Pickle is Medical Director for Pulmonary Rehab at [facility].

## 2022-12-27 NOTE — Progress Notes (Unsigned)
Cardiology Office Note:    Date:  12/30/2022   ID:  Damarie Breig, DOB April 27, 1961, MRN [id number]  PCP:  Cari Caraway, MD   [facility] Providers Cardiologist:  None     Referring MD: Cari Caraway, MD   Chief Complaint: follow-up leg edema, chest discomfort  History of Present Illness:    George Wallace is a very pleasant 62 y.o. 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 Stanford Breed for f/u of cardiomegaly noted on CXR. TTE 11/2014 revealed mild LVH, normal LV function.   Referred to cardiology by pulmonology for evaluation of LE edema and seen by Dr. Johney Frame on 11/27/22. Was originally undergoing CT scan for Ca score and was noted to have a nodular mass in the thymus. PET/CT scan showed avidity in the anterior mediastinal mass concerning for lymphoma. Biopsies at [facility] were nonconclusive. 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 He underwent robotic assisted resection of the mediastinal mass by Dr. Kipp Brood on 10/07/2022. Some of the left phrenic nerve was deeply invested into the tumor.  It was taken en bloc resulting in left hemidiaphragm elevation. Now requires BiPAP.   Seen in follow-up for George Lis, NP on 11/22/2022. He was feeling better. Planned for possible plication of his left hemidiaphragm in the future. TTE 10/2022 with LVEF 60 to 65%, normal RV, no significant valve disease.  Was notably having significant LE edema at that visit.  At office visit 11/27/22 with Dr. Johney Frame, he reported LE edema since hospital discharge. Initially started in his ankles but has progressed to the thighs. Has continued dyspnea on exertion that he thinks is slightly worse than a few weeks prior. Wears BiPAP at night and sleeps in a recliner to help with breathing.  Continues to have chronic chest tightness since  surgery which is not exertion related. Admits to dietary indiscretion with sodium. Normal diastolic function on echo and BP well controlled. Suspicion for venous insufficiency worsened by increased sodium intake. Advised to follow low Na diet, elevate legs, use compression, and start furosemide 40 mg daily along with potassium supplement. Postop atrial fibrillation during admission for thymic mass removal. Has been maintained on amiodarone. At OV, in NSR, amiodarone was decreased to 200 mg daily. ZIO monitor ordered to assess for recurrence. Cardiac monitor report 12/27/2022 reveals predominant underlying rhythm sinus rhythm with average HR 71 bpm, isolated SVEs.   Today, he is here alone for follow-up. Has stopped his medications due to concern for being on medications that are not needed. Felt that no one was managing the medications, they were just being continued from hospitalization. BP was never elevated. History of diabetes for which he chose not to take metformin, so took lisinopril for kidneys. Does not sleep well due to BiPAP. Feels that it is difficult to get comfortable because he is sleeping in a chair. On occasion feels difficulty breathing in that position. No PND. Feels better when taking Lasix  Leg edema has improved, no longer extends up to thighs. Tries to elevate his legs when sitting but it is uncomfortable to get them even with his nose. Wife is RN, monitors home BP which has been well controlled.  He continues to have mild chest discomfort that has improved since surgery. Feels that is 2/2 surgical incision. No DOE, palpitations, bleeding concerns, presyncope, syncope.  Past Medical History:  Diagnosis Date   Diabetes ([place])    Diet controlled   Hyperlipidemia     Past Surgical History:  Procedure Laterality Date   INTERCOSTAL NERVE BLOCK  10/07/2022   Procedure: INTERCOSTAL NERVE BLOCK;  Surgeon: Lajuana Matte, MD;  Location: [facility] OR;  Service: Thoracic;;   SINUS SURGERY  WITH INSTATRAK      Current Medications: Current Meds  Medication Sig   Accu-Chek Softclix Lancets lancets Use to check blood sugar daily   acetaminophen (TYLENOL) 500 MG tablet Take 1,000 mg by mouth every 6 (six) hours as needed for moderate pain.   albuterol (VENTOLIN HFA) 108 (90 Base) MCG/ACT inhaler Inhale 2 puffs into the lungs every 4 (four) hours as needed for wheezing.   aspirin EC 325 MG tablet Take 1 tablet (325 mg total) by mouth daily. (Patient taking differently: Take 325 mg by mouth as needed.)   Blood Glucose Monitoring Suppl (ACCU-CHEK GUIDE) w/Device KIT Use to check blood sugar daily   furosemide (LASIX) 40 MG tablet Take 1 tablet (40 mg total) by mouth daily.   glucose blood (ACCU-CHEK GUIDE) test strip Use to check blood sugar daily   ibuprofen (ADVIL) 200 MG tablet Take 600 mg by mouth as needed.   potassium chloride SA (KLOR-CON M) 20 MEQ tablet Take 1 tablet (20 mEq total) by mouth daily.   rosuvastatin (CRESTOR) 10 MG tablet Take 10 mg by mouth every evening.   Spacer/Aero-Holding Chambers (AEROCHAMBER PLUS FLO-VU LARGE) MISC 1 each by Other route once.   [DISCONTINUED] amiodarone (PACERONE) 200 MG tablet Take 1 tablet (200 mg total) by mouth daily.   [DISCONTINUED] cetirizine (ZYRTEC) 10 MG tablet Take 10 mg by mouth daily as needed for allergies.   [DISCONTINUED] docusate sodium (COLACE) 100 MG capsule Take 100 mg by mouth 2 (two) times daily.   [DISCONTINUED] lisinopril (ZESTRIL) 2.5 MG tablet Take 1 tablet (2.5 mg total) by mouth daily.   [DISCONTINUED] lisinopril (ZESTRIL) 2.5 MG tablet Take 1 tablet (2.5 mg total) by mouth daily.   [DISCONTINUED] metFORMIN (GLUCOPHAGE-XR) 500 MG 24 hr tablet Take 500 mg by mouth every morning.   [DISCONTINUED] metoprolol succinate (TOPROL-XL) 25 MG 24 hr tablet Take 3 tablets (75 mg total) by mouth daily.   [DISCONTINUED] rosuvastatin (CRESTOR) 10 MG tablet Take 1 tablet (10 mg total) by mouth daily.   [DISCONTINUED]  tamsulosin (FLOMAX) 0.4 MG CAPS capsule Take 1 capsule (0.4 mg total) by mouth daily.   [DISCONTINUED] tamsulosin (FLOMAX) 0.4 MG CAPS capsule Take 1 capsule (0.4 mg total) by mouth daily.   [DISCONTINUED] tamsulosin (FLOMAX) 0.4 MG CAPS capsule Take 1 capsule (0.4 mg total) by mouth daily.   [DISCONTINUED] traMADol (ULTRAM) 50 MG tablet Take 1 tablet (50 mg total) by mouth every 6 (six) hours as needed.   [DISCONTINUED] traMADol (ULTRAM) 50 MG tablet Take 1 tablet (50 mg total) by mouth every 6 (six) hours as needed for pain.     Allergies:   Patient has no known allergies.   Social History   Socioeconomic History   Marital status: Married    Spouse name: Not on file   Number of children: 2   Years of education: Not on file   Highest education level: Not on file  Occupational History   Not on file  Tobacco Use   Smoking status: Never   Smokeless tobacco: Never   Tobacco comments:    both parents smoked in home growing up.   Vaping Use   Vaping  Use: Never used  Substance and Sexual Activity   Alcohol use: No    Alcohol/week: 0.0 standard drinks of alcohol   Drug use: No   Sexual activity: Not on file  Other Topics Concern   Not on file  Social History Narrative   Not on file   Social Determinants of Health   Financial Resource Strain: Not on file  Food Insecurity: No Food Insecurity (10/07/2022)   Hunger Vital Sign    Worried About Running Out of Food in the Last Year: Never true    Ran Out of Food in the Last Year: Never true  Transportation Needs: No Transportation Needs (10/07/2022)   PRAPARE - Hydrologist (Medical): No    Lack of Transportation (Non-Medical): No  Physical Activity: Not on file  Stress: Not on file  Social Connections: Not on file     Family History: The patient's family history includes Emphysema in his father; Heart disease in an other family member.  ROS:   Please see the history of present illness.   + 2+  pitting edema bilateral LE All other systems reviewed and are negative.  Labs/Other Studies Reviewed:    The following studies were reviewed today:  Cardiac Monitor 12/27/22 Patient had a min HR of 52 bpm, max HR of 125 bpm, and avg HR of 71 bpm. Predominant underlying rhythm was Sinus Rhythm. Isolated SVEs were rare (<1.0%), SVE Couplets were rare (<1.0%), and no SVE Triplets were present. Isolated VEs were rare (<1.0%),  and no VE Couplets or VE Triplets were present.  Echo 11/21/22  1. Left ventricular ejection fraction, by estimation, is 60 to 65%. The  left ventricle has normal function. The left ventricle has no regional  wall motion abnormalities. Left ventricular diastolic parameters were  normal.   2. Right ventricular systolic function is normal. The right ventricular  size is normal.   3. The mitral valve is normal in structure. No evidence of mitral valve  regurgitation. No evidence of mitral stenosis.   4. The aortic valve is normal in structure. Aortic valve regurgitation is  not visualized. No aortic stenosis is present.   5. The inferior vena cava is normal in size with greater than 50%  respiratory variability, suggesting right atrial pressure of 3 mmHg.  CT Cardiac Score 04/16/22 IMPRESSION: 1. Coronary artery calcium score of 4.31. This places the patient in the 25th percentile, for patient's of a similar age, race, gender and ethnicity. 2. Nodular soft tissue and masslike areas in the anterior mediastinum suspicious for neoplasm perhaps thymic in origin. Lymphoma could also have this appearance though there is no adenopathy in the chest, consider dedicated imaging of the chest with contrasted CT or MRI with and without contrast. 3. Nonspecific 2 mm RIGHT upper lobe pulmonary nodule. 4. Hepatic steatosis.  Recent Labs: 10/03/2022: ALT 23 10/16/2022: Hemoglobin 11.5; Platelets 437 10/17/2022: Magnesium 2.3 11/27/2022: NT-Pro BNP <36 12/04/2022: BUN 10; Creatinine,  Ser 1.04; Potassium 4.3; Sodium 142  Recent Lipid Panel No results found for: "CHOL", "TRIG", "HDL", "CHOLHDL", "VLDL", "LDLCALC", "LDLDIRECT"   Risk Assessment/Calculations:    CHA2DS2-VASc Score = 1  This indicates a 0.6% annual risk of stroke. The patient's score is based upon: CHF History: 0 HTN History: 0 Diabetes History: 1 Stroke History: 0 Vascular Disease History: 0 Age Score: 0 Gender Score: 0       Physical Exam:    VS:  BP 112/80   Pulse 87  Ht '5\' 9"'$  (1.753 m)   Wt 267 lb 6.4 oz (121.3 kg)   SpO2 97%   BMI 39.49 kg/m     Wt Readings from Last 3 Encounters:  12/30/22 267 lb 6.4 oz (121.3 kg)  12/24/22 266 lb 12.1 oz (121 kg)  12/13/22 267 lb (121.1 kg)     GEN: Obese well developed in no acute distress HEENT: Normal NECK: No JVD; No carotid bruits CARDIAC: RRR, no murmurs, rubs, gallops RESPIRATORY:  Clear to auscultation without rales, wheezing or rhonchi  ABDOMEN: Soft, non-tender, non-distended MUSCULOSKELETAL:  2+ pitting edema bilateral LE; No deformity. 2+ pedal pulses, equal bilaterally SKIN: Warm and dry. Sternotomy site well approximated. He has a keloid scar at the base of the incision.  NEUROLOGIC:  Alert and oriented x 3 PSYCHIATRIC:  Normal affect   EKG:  EKG is not ordered today.     Diagnoses:    1. Bilateral lower extremity edema   2. Elevated coronary artery calcium score   3. Aortic atherosclerosis ([place])   4. Medication management   5. Atrial fibrillation, unspecified type ([place])   6. Diaphragmatic paralysis   7. Mediastinal mass    Assessment and Plan:     Leg edema: Leg edema has improved on Lasix. Normal BNP on 11/27/22. Normal LV function on echo 11/20/22. Weight is stable. Advised continued dietary sodium restriction and leg elevation. Continue Lasix 40 mg daily with K-Dur 20 milliequivalents daily. Will check renal function today.   Coronary artery calcification: Ca score 4.31 (25% for age/sex/race) 03/2022. He denies  chest pain, dyspnea, or other symptoms concerning for angina. Has mild incisional chest soreness s/p sternotomy. No indication for further ischemic evaluation at this time. Emphasized the importance of good cholesterol control.  He will resume rosuvastatin 10 mg daily.  PAF: Post-op atrial fibrillation for which he was prescribed amiodarone. No evidence of A-fib on 14 day cardiac monitor 12/27/2022. He subsequently stopped the amiodarone. CHA2DS2-VASc score equals 1, not on Deport. Advised him to monitor for return of a fib and notify us if he has concerns.   Thymic mass s/p resection: Surgical removal of thymic mass encasing left phrenic nerve c/b left hemidiaphragm requiring BiPAP at night. Mild chest tenderness at sternotomy site. He is 3 months post-op, do not feel that pain is inappropriate. Continue to increase activity gradually. Advised him to report worsening pain to CT surgery.   Hyperlipidemia LDL goal < 70: LDL 152 on 09/23/2022.  Emphasized the importance of LDL goal less than 70.  He will resume rosuvastatin 10 mg daily. Recommend recheck lipids at next office visit.   Medication management: He stopped amiodarone, lisinopril, metformin, metoprolol, rosuvastatin on his own. Lengthy discussion about medications. I have asked him to monitor HR and BP and report abnormalities. He is agreeable to resume rosuvastatin as noted above.  Asks [place] to recheck A1C and CBC in addition to BMET today.   Disposition: 3 months with Dr. Johney Frame  Medication Adjustments/Labs and Tests Ordered: Current medicines are reviewed at length with the patient today.  Concerns regarding medicines are outlined above.  Orders Placed This Encounter  Procedures   HgB A1c   CBC   Basic Metabolic Panel (BMET)   No orders of the defined types were placed in this encounter.   Patient Instructions  Medication Instructions:   RESTART Crestor one (1) tablet by mouth ( 10 mg ) every evening.   *If you need a refill on  your cardiac medications before your  next appointment, please call your pharmacy*   Lab Work:  TODAY!!!! A1C/CBC/BMET  If you have labs (blood work) drawn today and your tests are completely normal, you will receive your results only by: [place] (if you have MyChart) OR A paper copy in the mail If you have any lab test that is abnormal or we need to change your treatment, we will call you to review the results.   Testing/Procedures:  None ordered.   Follow-Up: At [facility], you and your health needs are our priority.  As part of our continuing mission to provide you with exceptional heart care, we have created designated Provider Care Teams.  These Care Teams include your primary Cardiologist (physician) and Advanced Practice Providers (APPs -  Physician Assistants and Nurse Practitioners) who all work together to provide you with the care you need, when you need it.  We recommend signing up for the patient portal called "MyChart".  Sign up information is provided on this After Visit Summary.  MyChart is used to connect with patients for Virtual Visits (Telemedicine).  Patients are able to view lab/test results, encounter notes, upcoming appointments, etc.  Non-urgent messages can be sent to your provider as well.   To learn more about what you can do with MyChart, go to [email or web address].    Your next appointment:   3 month(s)  Provider:   Ernest Mallick.   Other Instructions  HOW TO TAKE YOUR BLOOD PRESSURE  Rest 5 minutes before taking your blood pressure. Don't  smoke or drink caffeinated beverages for at least 30 minutes before. Take your blood pressure before (not after) you eat. Sit comfortably with your back supported and both feet on the floor ( don't cross your legs). Elevate your arm to heart level on a table or a desk. Use the proper sized cuff.  It should fit smoothly and snugly around your bare upper arm.  There should be  Enough  room to slip a fingertip under the cuff.  The bottom edge of the cuff should be 1 inch above the crease Of the elbow. Please monitor your blood pressure once daily 2 hours after your am medication. If you blood pressure Consistently remains less than [id number] (systolic) top number or less than 80 ( diastolic) bottom number X 3 days. Heart rate should be under 100. Consecutively.  Please call our office at [phone] or send Mychart message.     ----Avoid cold medicines with D or DM at the end of them----       Signed, Emmaline Life, NP  12/30/2022 4:53 PM    [place]

## 2022-12-30 ENCOUNTER — Ambulatory Visit: Payer: BC Managed Care – PPO | Admitting: Cardiology

## 2022-12-30 ENCOUNTER — Encounter (INDEPENDENT_AMBULATORY_CARE_PROVIDER_SITE_OTHER): Payer: BC Managed Care – PPO | Admitting: Nurse Practitioner

## 2022-12-30 ENCOUNTER — Encounter: Payer: Self-pay | Admitting: Nurse Practitioner

## 2022-12-30 VITALS — BP 112/80 | HR 87 | Ht 69.0 in | Wt 267.4 lb

## 2022-12-30 DIAGNOSIS — I700 Atherosclerosis of aorta: Secondary | ICD-10-CM | POA: Diagnosis not present

## 2022-12-30 DIAGNOSIS — R600 Localized edema: Secondary | ICD-10-CM

## 2022-12-30 DIAGNOSIS — I2510 Atherosclerotic heart disease of native coronary artery without angina pectoris: Secondary | ICD-10-CM | POA: Insufficient documentation

## 2022-12-30 DIAGNOSIS — J986 Disorders of diaphragm: Secondary | ICD-10-CM

## 2022-12-30 DIAGNOSIS — E119 Type 2 diabetes mellitus without complications: Secondary | ICD-10-CM | POA: Insufficient documentation

## 2022-12-30 DIAGNOSIS — R931 Abnormal findings on diagnostic imaging of heart and coronary circulation: Secondary | ICD-10-CM | POA: Insufficient documentation

## 2022-12-30 DIAGNOSIS — Z79899 Other long term (current) drug therapy: Secondary | ICD-10-CM | POA: Insufficient documentation

## 2022-12-30 DIAGNOSIS — R0602 Shortness of breath: Secondary | ICD-10-CM | POA: Insufficient documentation

## 2022-12-30 DIAGNOSIS — I480 Paroxysmal atrial fibrillation: Secondary | ICD-10-CM | POA: Insufficient documentation

## 2022-12-30 DIAGNOSIS — I4891 Unspecified atrial fibrillation: Secondary | ICD-10-CM | POA: Diagnosis not present

## 2022-12-30 DIAGNOSIS — E785 Hyperlipidemia, unspecified: Secondary | ICD-10-CM | POA: Insufficient documentation

## 2022-12-30 DIAGNOSIS — R0789 Other chest pain: Secondary | ICD-10-CM | POA: Insufficient documentation

## 2022-12-30 DIAGNOSIS — J9859 Other diseases of mediastinum, not elsewhere classified: Secondary | ICD-10-CM

## 2022-12-30 NOTE — Patient Instructions (Signed)
Medication Instructions:   RESTART Crestor one (1) tablet by mouth ( 10 mg ) every evening.   *If you need a refill on your cardiac medications before your next appointment, please call your pharmacy*   Lab Work:  TODAY!!!! A1C/CBC/BMET  If you have labs (blood work) drawn today and your tests are completely normal, you will receive your results only by: [place] (if you have MyChart) OR A paper copy in the mail If you have any lab test that is abnormal or we need to change your treatment, we will call you to review the results.   Testing/Procedures:  None ordered.   Follow-Up: At [facility], you and your health needs are our priority.  As part of our continuing mission to provide you with exceptional heart care, we have created designated Provider Care Teams.  These Care Teams include your primary Cardiologist (physician) and Advanced Practice Providers (APPs -  Physician Assistants and Nurse Practitioners) who all work together to provide you with the care you need, when you need it.  We recommend signing up for the patient portal called "MyChart".  Sign up information is provided on this After Visit Summary.  MyChart is used to connect with patients for Virtual Visits (Telemedicine).  Patients are able to view lab/test results, encounter notes, upcoming appointments, etc.  Non-urgent messages can be sent to your provider as well.   To learn more about what you can do with MyChart, go to [email or web address].    Your next appointment:   3 month(s)  Provider:   Ernest Mallick.   Other Instructions  HOW TO TAKE YOUR BLOOD PRESSURE  Rest 5 minutes before taking your blood pressure. Don't  smoke or drink caffeinated beverages for at least 30 minutes before. Take your blood pressure before (not after) you eat. Sit comfortably with your back supported and both feet on the floor ( don't cross your legs). Elevate your arm to heart level on a table or a  desk. Use the proper sized cuff.  It should fit smoothly and snugly around your bare upper arm.  There should be  Enough room to slip a fingertip under the cuff.  The bottom edge of the cuff should be 1 inch above the crease Of the elbow. Please monitor your blood pressure once daily 2 hours after your am medication. If you blood pressure Consistently remains less than [id number] (systolic) top number or less than 80 ( diastolic) bottom number X 3 days. Heart rate should be under 100. Consecutively.  Please call our office at [phone] or send Mychart message.     ----Avoid cold medicines with D or DM at the end of them----

## 2022-12-31 ENCOUNTER — Encounter (HOSPITAL_COMMUNITY)
Admission: RE | Admit: 2022-12-31 | Discharge: 2022-12-31 | Disposition: A | Discharge status: Home / Self Care | Payer: BC Managed Care – PPO | Source: Ambulatory Visit | Attending: Internal Medicine | Admitting: Internal Medicine

## 2022-12-31 DIAGNOSIS — R931 Abnormal findings on diagnostic imaging of heart and coronary circulation: Secondary | ICD-10-CM | POA: Diagnosis not present

## 2022-12-31 DIAGNOSIS — I4891 Unspecified atrial fibrillation: Secondary | ICD-10-CM | POA: Diagnosis not present

## 2022-12-31 DIAGNOSIS — R0602 Shortness of breath: Secondary | ICD-10-CM | POA: Diagnosis not present

## 2022-12-31 DIAGNOSIS — E119 Type 2 diabetes mellitus without complications: Secondary | ICD-10-CM | POA: Diagnosis not present

## 2022-12-31 DIAGNOSIS — J986 Disorders of diaphragm: Secondary | ICD-10-CM | POA: Diagnosis not present

## 2022-12-31 DIAGNOSIS — E785 Hyperlipidemia, unspecified: Secondary | ICD-10-CM | POA: Diagnosis not present

## 2022-12-31 DIAGNOSIS — I2510 Atherosclerotic heart disease of native coronary artery without angina pectoris: Secondary | ICD-10-CM | POA: Diagnosis not present

## 2022-12-31 DIAGNOSIS — Z79899 Other long term (current) drug therapy: Secondary | ICD-10-CM | POA: Diagnosis not present

## 2022-12-31 DIAGNOSIS — R0789 Other chest pain: Secondary | ICD-10-CM | POA: Diagnosis not present

## 2022-12-31 DIAGNOSIS — I700 Atherosclerosis of aorta: Secondary | ICD-10-CM | POA: Diagnosis not present

## 2022-12-31 DIAGNOSIS — J9859 Other diseases of mediastinum, not elsewhere classified: Secondary | ICD-10-CM | POA: Diagnosis not present

## 2022-12-31 DIAGNOSIS — R600 Localized edema: Secondary | ICD-10-CM | POA: Diagnosis not present

## 2022-12-31 DIAGNOSIS — I480 Paroxysmal atrial fibrillation: Secondary | ICD-10-CM | POA: Diagnosis not present

## 2022-12-31 LAB — BASIC METABOLIC PANEL
BUN/Creatinine Ratio: 9 (ref 9–20)
BUN: 10 mg/dL (ref 6–24)
CO2: 25 mmol/L (ref 20–29)
Calcium: 9.8 mg/dL (ref 8.7–10.2)
Chloride: 100 mmol/L (ref 96–106)
Creatinine, Ser: 1.11 mg/dL (ref 0.76–1.27)
Glucose: 113 mg/dL — ABNORMAL HIGH (ref 70–99)
Potassium: 3.9 mmol/L (ref 3.5–5.2)
Sodium: 142 mmol/L (ref 134–144)
eGFR: 76 mL/min/{1.73_m2} (ref >59)

## 2022-12-31 LAB — HEMOGLOBIN A1C
Est. average glucose Bld gHb Est-mCnc: 131 mg/dL
Hgb A1c MFr Bld: 6.2 % — ABNORMAL HIGH (ref 4.8–5.6)

## 2022-12-31 LAB — CBC
Hematocrit: 40.8 % (ref 37.5–51.0)
Hemoglobin: 13.4 g/dL (ref 13.0–17.7)
MCH: 28.9 pg (ref 26.6–33.0)
MCHC: 32.8 g/dL (ref 31.5–35.7)
MCV: 88 fL (ref 79–97)
Platelets: 348 10*3/uL (ref 150–450)
RBC: 4.64 x10E6/uL (ref 4.14–5.80)
RDW: 12.8 % (ref 11.6–15.4)
WBC: 5.2 10*3/uL (ref 3.4–10.8)

## 2022-12-31 NOTE — Progress Notes (Signed)
Daily Session Note  Patient Details  Name: George Wallace MRN: [id number] Date of Birth: April 12, 1961 Referring Provider:   April Manson Pulmonary Rehab Walk Test from 12/09/2022 in [facility]  Referring Provider [name]       Encounter Date: 12/31/2022  Check In:  Session Check In - 12/31/22 1201       Check-In   Supervising physician immediately available to respond to emergencies [facility] MD immediately available    Physician(s) Dr Gala Romney    Location [facility]    Staff Present Janine Ores, RN, Quentin Ore, MS, ACSM-CEP, Exercise Physiologist;Randi Yevonne Pax, ACSM-CEP, Exercise Physiologist;Samantha [place], RD, LDN;Other    Virtual Visit No    Medication changes reported     No    Fall or balance concerns reported    No    Tobacco Cessation No Change    Warm-up and Cool-down Performed as group-led instruction    Resistance Training Performed Yes    VAD Patient? No    PAD/SET Patient? No      Pain Assessment   Currently in Pain? No/denies    Multiple Pain Sites No             Capillary Blood Glucose: Results for orders placed or performed in visit on 12/30/22 (from the past 24 hour(s))  HgB A1c     Status: Abnormal   Collection Time: 12/30/22  3:04 PM  Result Value Ref Range   Hgb A1c MFr Bld 6.2 (H) 4.8 - 5.6 %   Est. average glucose Bld gHb Est-mCnc 131 mg/dL   Narrative   Performed at:  [address] [place], [place]  [id number] Lab Director: Rush Farmer MD, Phone:  [phone]  CBC     Status: None   Collection Time: 12/30/22  3:04 PM  Result Value Ref Range   WBC 5.2 3.4 - 10.8 x10E3/uL   RBC 4.64 4.14 - 5.80 x10E6/uL   Hemoglobin 13.4 13.0 - 17.7 g/dL   Hematocrit 40.8 37.5 - 51.0 %   MCV 88 79 - 97 fL   MCH 28.9 26.6 - 33.0 pg   MCHC 32.8 31.5 - 35.7 g/dL   RDW 12.8 11.6 - 15.4 %   Platelets 348 150 - 450 x10E3/uL   Narrative   Performed at:  [facility] [address] [place], [place]  [id number] Lab Director: Rush Farmer MD, Phone:  [phone]  Basic Metabolic Panel (BMET)     Status: Abnormal   Collection Time: 12/30/22  3:04 PM  Result Value Ref Range   Glucose 113 (H) 70 - 99 mg/dL   BUN 10 6 - 24 mg/dL   Creatinine, Ser 1.11 0.76 - 1.27 mg/dL   eGFR 76 >59 mL/min/1.73   BUN/Creatinine Ratio 9 9 - 20   Sodium 142 134 - 144 mmol/L   Potassium 3.9 3.5 - 5.2 mmol/L   Chloride 100 96 - 106 mmol/L   CO2 25 20 - 29 mmol/L   Calcium 9.8 8.7 - 10.2 mg/dL   Narrative   Performed at:  [facility] [address] [place], [place]  [id number] Lab Director: Rush Farmer MD, Phone:  [phone]      Social History   Tobacco Use  Smoking Status Never  Smokeless Tobacco Never  Tobacco Comments   both parents smoked in home growing up.     Goals Met:  Proper associated with RPD/PD &  O2 Sat Independence with exercise equipment Exercise tolerated well No report of concerns or symptoms today Strength training completed today  Goals Unmet:  Not Applicable  Comments: Service time is from 1025 to 1144.    Dr. Rodman Pickle is Medical Director for Pulmonary Rehab at [facility].

## 2023-01-01 ENCOUNTER — Ambulatory Visit: Payer: BC Managed Care – PPO | Attending: Acute Care | Admitting: Physical Therapy

## 2023-01-01 DIAGNOSIS — R2689 Other abnormalities of gait and mobility: Secondary | ICD-10-CM | POA: Diagnosis not present

## 2023-01-01 DIAGNOSIS — M6281 Muscle weakness (generalized): Secondary | ICD-10-CM | POA: Insufficient documentation

## 2023-01-01 NOTE — Therapy (Signed)
OUTPATIENT PHYSICAL THERAPY NEURO TREATMENT-DISCHARGE NOTE   Patient Name: George Wallace MRN: [id number] DOB:02/16/1961, 62 y.o., male Today's Date: 01/01/2023   PHYSICAL THERAPY DISCHARGE SUMMARY  Visits from Start of Care: 5  Current functional level related to goals / functional outcomes: Mod I   Remaining deficits: Decreased endurance   Education / Equipment: Handout for HEP   Patient agrees to discharge. Patient goals were partially met. Patient is being discharged due to maximized rehab potential.     PCP: Cari Caraway, MD REFERRING PROVIDER: Magdalen Spatz, NP  END OF SESSION:  PT End of Session - 01/01/23 1148     Visit Number 5    Number of Visits 5   with eval   Date for PT Re-Evaluation 01/10/23    Authorization Type BCBS    PT Start Time 1147    PT Stop Time 1211   d/c   PT Time Calculation (min) 24 min    Equipment Utilized During Treatment Gait belt    Activity Tolerance Patient tolerated treatment well    Behavior During Therapy WFL for tasks assessed/performed                 Past Medical History:  Diagnosis Date   Diabetes ([place])    Diet controlled   Hyperlipidemia    Past Surgical History:  Procedure Laterality Date   INTERCOSTAL NERVE BLOCK  10/07/2022   Procedure: INTERCOSTAL NERVE BLOCK;  Surgeon: Lajuana Matte, MD;  Location: [facility];  Service: Thoracic;;   SINUS SURGERY WITH INSTATRAK     Patient Active Problem List   Diagnosis Date Noted   Acute respiratory failure ([place]) 10/07/2022   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   Type 2 diabetes mellitus without complication, without long-term current use of insulin ([place]) 10/07/2022   Hardening of the aorta (main artery of the heart) ([place]) 06/07/2022   Excessive sleepiness 06/07/2022   Hyperlipidemia, unspecified 06/07/2022   Inattention 06/07/2022   Obstructive sleep apnea (adult) (pediatric) 06/07/2022   Personal history of colonic polyps 06/07/2022   Type 2 diabetes  mellitus with other specified complication ([place]) [id number]   Vitamin D deficiency 06/07/2022   Mediastinal mass 05/31/2022   Intermittent asthma without complication [id number]   Cardiomegaly 12/06/2014   Wheeze 12/06/2014   Morbid obesity ([place]) 12/06/2014    ONSET DATE: 11/25/2022  REFERRING DIAG: J98.59 (ICD-10-CM) - Mediastinal mass R53.81 (ICD-10-CM) - Physical deconditioning  THERAPY DIAG:  Muscle weakness (generalized)  Other abnormalities of gait and mobility  Rationale for Evaluation and Treatment: Rehabilitation  SUBJECTIVE:  SUBJECTIVE STATEMENT: Pt reports that he is feeling more congested/having more phlegm today. Pt not having as much pain across his chest but does still have tenderness at site of incision and some feeling of tightness/pulling. Pt reports that pulmonary rehab is going well (does the elliptical, treadmill, stretching, balance) and he will go to that for 18 weeks total. Pt notes that he is tolerating rides in the car better.  Pt encouraged to reach out to pulmonologist regarding phlegm and discuss use of incentive spirometer and flutter valve with pulmonary rehab.  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 Yes: NPRS scale: 6/10 Pain location: chest and abdomen from incisions Pain description: tightness, pulling Aggravating factors: lifting, certain movements Relieving factors: pain medication  PRECAUTIONS: Sternal  WEIGHT BEARING RESTRICTIONS: No  FALLS: Has patient fallen in last 6 months? 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 99-100% SpO2 at rest and with activity    DIAGNOSTIC FINDINGS:  Chest xray 10/29/2022 IMPRESSION: There are linear densities in left mid and both lower lung fields suggesting scarring and subsegmental atelectasis. Left hemidiaphragm is elevated. No significant interval changes are noted since 10/18/2022  TODAY'S TREATMENT:                                                                                                                               THER ACT: Reassessed for LTG assessment:  [facility] PT Assessment - 01/01/23 1224       Ambulation/Gait   Gait velocity 32.8 ft over 10 sec = 3.28 ft/sec   no AD     6 minute walk test results    Aerobic Endurance Distance Walked 1367    Endurance additional comments RPE 2/10             PATIENT EDUCATION: Education details: continue walking program and HEP as well as pulmonary rehab Person educated: Patient Education method: Customer service manager Education comprehension: verbalized understanding and returned demonstration  HOME EXERCISE PROGRAM: Initiated home walking program:  Walking Program: Walk with your family. Starting next week, walk 5 min per day for 7 days. Following week add 5 minutes to your total time. Week 1: 10 min Week 2: 15 min Week 3: 20 min Week 4: 25 min...Marland KitchenMarland KitchenUntil you can walk  to 30 min per day   Ascend/descend your stairs x 6 repetitions each day (verbally added to HEP 2/14)  Access Code: [email or web address] URL:  [email or web address] Date: 12/16/2022 Prepared by: Excell Seltzer  Exercises - Forward and Backward Monster Walk with Resistance at Ankles and Counter Support  - 1 x daily - 7 x weekly - 3 sets - 10 reps - Seated Upper Trapezius Stretch  - 1 x daily - 7 x weekly - 1 sets - 5 reps - 30 sec hold - Gentle Levator Scapulae Stretch  - 1 x daily - 7 x weekly - 1 sets - 5 reps - 30 sec hold - Sit to Stand with Resistance Around Legs  - 1 x daily - 7 x weekly - 3 sets - 10 reps   GOALS: Goals reviewed with patient? Yes  SHORT TERM GOALS=LONG TERM GOALS due to length of POC  LONG TERM GOALS: Target date: 01/10/2023   Pt will be independent with final HEP for improved strength, balance, transfers and gait. Baseline: walking program initiated at eval (2/9) Goal status: MET  2.  Pt will improve gait velocity to at least 3.5 ft/sec for improved gait efficiency and performance at mod I level  Baseline: 3.12 ft/sec (2/9), 3.28 ft/sec (3/6) Goal status: NOT MET  3.  Pt will ambulate greater than or equal to 1800 feet on 6MWT with LRAD and mod I for improved cardiovascular endurance and BLE strength.  Baseline: 1169 ft no AD mod I (2/9), [id number] ft no AD mod I (3/6) Goal status: NOT MET   ASSESSMENT:  CLINICAL IMPRESSION: Emphasis of skilled PT session on reassessing LTG in preparation for d/c from PT services. Pt has met 1/3 LTG due to being independent with his final HEP. Pt did make progress with increasing his gait speed from 3.12 ft/sec initially to 3.28 ft/sec this date as well as improving his distance covered during the 6MWT from 1169 ft initially to 1367 ft this date, however he did not quite meet the goals set for gait speed and 6MWT distance. Pt to continue with pulmonary rehab to keep working on improving his endurance and to hopefully return to his PLOF as much as he can. Pt is agreeable to d/c from OPPT services at this time.    OBJECTIVE IMPAIRMENTS: cardiopulmonary  status limiting activity, decreased activity tolerance, decreased endurance, impaired perceived functional ability, and pain.   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   REHAB POTENTIAL: Good  CLINICAL DECISION MAKING: Stable/uncomplicated  EVALUATION COMPLEXITY: Low    Excell Seltzer, PT, DPT, CSRS 01/01/2023, 12:24 PM

## 2023-01-02 ENCOUNTER — Encounter (HOSPITAL_COMMUNITY)
Admission: RE | Admit: 2023-01-02 | Discharge: 2023-01-02 | Disposition: A | Discharge status: Home / Self Care | Payer: BC Managed Care – PPO | Source: Ambulatory Visit | Attending: Internal Medicine | Admitting: Internal Medicine

## 2023-01-02 DIAGNOSIS — J9859 Other diseases of mediastinum, not elsewhere classified: Secondary | ICD-10-CM | POA: Diagnosis not present

## 2023-01-02 DIAGNOSIS — R600 Localized edema: Secondary | ICD-10-CM | POA: Diagnosis not present

## 2023-01-02 DIAGNOSIS — R0789 Other chest pain: Secondary | ICD-10-CM | POA: Diagnosis not present

## 2023-01-02 DIAGNOSIS — R0602 Shortness of breath: Secondary | ICD-10-CM

## 2023-01-02 DIAGNOSIS — I4891 Unspecified atrial fibrillation: Secondary | ICD-10-CM | POA: Diagnosis not present

## 2023-01-02 DIAGNOSIS — Z79899 Other long term (current) drug therapy: Secondary | ICD-10-CM | POA: Diagnosis not present

## 2023-01-02 DIAGNOSIS — I480 Paroxysmal atrial fibrillation: Secondary | ICD-10-CM | POA: Diagnosis not present

## 2023-01-02 DIAGNOSIS — E785 Hyperlipidemia, unspecified: Secondary | ICD-10-CM | POA: Diagnosis not present

## 2023-01-02 DIAGNOSIS — I2510 Atherosclerotic heart disease of native coronary artery without angina pectoris: Secondary | ICD-10-CM | POA: Diagnosis not present

## 2023-01-02 DIAGNOSIS — R931 Abnormal findings on diagnostic imaging of heart and coronary circulation: Secondary | ICD-10-CM | POA: Diagnosis not present

## 2023-01-02 DIAGNOSIS — E119 Type 2 diabetes mellitus without complications: Secondary | ICD-10-CM | POA: Diagnosis not present

## 2023-01-02 DIAGNOSIS — I700 Atherosclerosis of aorta: Secondary | ICD-10-CM | POA: Diagnosis not present

## 2023-01-02 DIAGNOSIS — J986 Disorders of diaphragm: Secondary | ICD-10-CM | POA: Diagnosis not present

## 2023-01-07 ENCOUNTER — Encounter (HOSPITAL_COMMUNITY)
Admission: RE | Admit: 2023-01-07 | Discharge: 2023-01-07 | Disposition: A | Discharge status: Home / Self Care | Payer: BC Managed Care – PPO | Source: Ambulatory Visit | Attending: Internal Medicine | Admitting: Internal Medicine

## 2023-01-07 DIAGNOSIS — R0789 Other chest pain: Secondary | ICD-10-CM | POA: Diagnosis not present

## 2023-01-07 DIAGNOSIS — I4891 Unspecified atrial fibrillation: Secondary | ICD-10-CM | POA: Diagnosis not present

## 2023-01-07 DIAGNOSIS — Z79899 Other long term (current) drug therapy: Secondary | ICD-10-CM | POA: Diagnosis not present

## 2023-01-07 DIAGNOSIS — I480 Paroxysmal atrial fibrillation: Secondary | ICD-10-CM | POA: Diagnosis not present

## 2023-01-07 DIAGNOSIS — I2510 Atherosclerotic heart disease of native coronary artery without angina pectoris: Secondary | ICD-10-CM | POA: Diagnosis not present

## 2023-01-07 DIAGNOSIS — E785 Hyperlipidemia, unspecified: Secondary | ICD-10-CM | POA: Diagnosis not present

## 2023-01-07 DIAGNOSIS — J9859 Other diseases of mediastinum, not elsewhere classified: Secondary | ICD-10-CM | POA: Diagnosis not present

## 2023-01-07 DIAGNOSIS — J986 Disorders of diaphragm: Secondary | ICD-10-CM | POA: Diagnosis not present

## 2023-01-07 DIAGNOSIS — R600 Localized edema: Secondary | ICD-10-CM | POA: Diagnosis not present

## 2023-01-07 DIAGNOSIS — E119 Type 2 diabetes mellitus without complications: Secondary | ICD-10-CM | POA: Diagnosis not present

## 2023-01-07 DIAGNOSIS — R0602 Shortness of breath: Secondary | ICD-10-CM

## 2023-01-07 DIAGNOSIS — I700 Atherosclerosis of aorta: Secondary | ICD-10-CM | POA: Diagnosis not present

## 2023-01-07 DIAGNOSIS — R931 Abnormal findings on diagnostic imaging of heart and coronary circulation: Secondary | ICD-10-CM | POA: Diagnosis not present

## 2023-01-07 NOTE — Progress Notes (Signed)
Daily Session Note  Patient Details  Name: George Wallace MRN: [id number] Date of Birth: October 01, 1961 Referring Provider:   April Manson Pulmonary Rehab Walk Test from 12/09/2022 in [facility] [place]  Referring Provider [name]       Encounter Date: 01/07/2023  Check In:  Session Check In - 01/07/23 1139       Check-In   Supervising physician immediately available to respond to emergencies [facility] - Physician supervision    Physician(s) Barbaraann Barthel, NP    Location [facility]    Staff Present Janine Ores, RN, BSN;Randi Olen Cordial BS, ACSM-CEP, Exercise Physiologist;Mitcheal Sweetin Rosana Hoes, MS, ACSM-CEP, Exercise Physiologist;Other    Virtual Visit No    Medication changes reported     No    Fall or balance concerns reported    No    Tobacco Cessation No Change    Warm-up and Cool-down Performed as group-led instruction    Resistance Training Performed Yes    VAD Patient? No    PAD/SET Patient? No      Pain Assessment   Currently in Pain? No/denies    Multiple Pain Sites No             Capillary Blood Glucose: No results found for this or any previous visit (from the past 24 hour(s)).    Social History   Tobacco Use  Smoking Status Never  Smokeless Tobacco Never  Tobacco Comments   both parents smoked in home growing up.     Goals Met:  Proper associated with RPD/PD & O2 Sat Independence with exercise equipment Exercise tolerated well No report of concerns or symptoms today Strength training completed today  Goals Unmet:  Not Applicable  Comments: Service time is from 1020 to 1150.    Dr. Rodman Pickle is Medical Director for Pulmonary Rehab at [facility].

## 2023-01-08 NOTE — Progress Notes (Signed)
Pulmonary Individual Treatment Plan  Patient Details  Name: George Wallace MRN: [id number] Date of Birth: [birth year] Referring Provider:   April Wallace Pulmonary Rehab Walk Test from 12/09/2022 in [facility] for Heart, Vascular, & [place]  Referring Provider George Wallace       Initial Encounter Date:  Flowsheet Row Pulmonary Rehab Walk Test from 12/09/2022 in [facility] for Heart, Vascular, & [place]  Date 12/09/22       Visit Diagnosis: Shortness of breath  Patient's Home Medications on Admission:   Current Outpatient Medications:    Accu-Chek Softclix Lancets lancets, Use to check blood sugar daily, Disp: 100 each, Rfl: 1   acetaminophen (TYLENOL) 500 MG tablet, Take 1,000 mg by mouth every 6 (six) hours as needed for moderate pain., Disp: , Rfl:    albuterol (VENTOLIN HFA) 108 (90 Base) MCG/ACT inhaler, Inhale 2 puffs into the lungs every 4 (four) hours as needed for wheezing., Disp: 6.7 g, Rfl: 2   aspirin EC 325 MG tablet, Take 1 tablet (325 mg total) by mouth daily. (Patient taking differently: Take 325 mg by mouth as needed.), Disp: , Rfl:    Blood Glucose Monitoring Suppl (ACCU-CHEK GUIDE) w/Device KIT, Use to check blood sugar daily, Disp: 1 kit, Rfl: 0   furosemide (LASIX) 40 MG tablet, Take 1 tablet (40 mg total) by mouth daily., Disp: 90 tablet, Rfl: 2   glucose blood (ACCU-CHEK GUIDE) test strip, Use to check blood sugar daily, Disp: 100 strip, Rfl: 1   ibuprofen (ADVIL) 200 MG tablet, Take 600 mg by mouth as needed., Disp: , Rfl:    potassium chloride SA (KLOR-CON M) 20 MEQ tablet, Take 1 tablet (20 mEq total) by mouth daily., Disp: 90 tablet, Rfl: 2   rosuvastatin (CRESTOR) 10 MG tablet, Take 10 mg by mouth every evening., Disp: , Rfl:    Spacer/Aero-Holding Chambers (AEROCHAMBER PLUS FLO-VU LARGE) MISC, 1 each by Other route once., Disp: 1 each, Rfl: 0  Past Medical History: Past Medical History:  Diagnosis Date    Diabetes ([place])    Diet controlled   Hyperlipidemia     Tobacco Use: Social History   Tobacco Use  Smoking Status Never  Smokeless Tobacco Never  Tobacco Comments   both parents smoked in home growing up.     Labs: Review Flowsheet  More data may exist      Latest Ref Rng & Units 02/15/2015 10/03/2022 10/07/2022 10/18/2022 12/30/2022  Labs for ITP Cardiac and Pulmonary Rehab  Hemoglobin A1c 4.8 - 5.6 % - 6.9  - - 6.2   PH, Arterial 7.35 - 7.45 - - 7.277  7.284  7.295  7.277  7.199  7.46  -  PCO2 arterial 32 - 48 mmHg - - 44.1  43.0  42.4  42.8  56.8  41  -  Bicarbonate 20.0 - 28.0 mmol/L - - 20.9  20.4  20.6  19.9  22.1  29.2  -  TCO2 22 - 32 mmol/L 22 - 32 mmol/L 23  - '22  23  22  22  21  24  '$ - -  Acid-base deficit 0.0 - 2.0 mmol/L - - 6.0  6.0  6.0  7.0  6.0  - -  O2 Saturation % - - 99  100  100  100  93  95.6  -    Capillary Blood Glucose: Lab Results  Component Value Date   GLUCAP 111 (H) 12/19/2022   GLUCAP 106 (  H) 12/17/2022   GLUCAP 102 (H) 12/09/2022   GLUCAP 115 (H) 10/22/2022   GLUCAP 106 (H) 10/22/2022    POCT Glucose     Row Name 12/19/22 1018             POCT Blood Glucose   Pre-Exercise 111 mg/dL                Pulmonary Assessment Scores:  Pulmonary Assessment Scores     Row Name 12/09/22 0934         ADL UCSD   ADL Phase Entry     SOB Score total 4       CAT Score   CAT Score 13       mMRC Score   mMRC Score 1             UCSD: Self-administered rating of dyspnea associated with activities of daily living (ADLs) 6-point scale (0 = "not at all" to 5 = "maximal or unable to do because of breathlessness")  Scoring Scores range from 0 to 120.  Minimally important difference is 5 units  CAT: CAT can identify the health impairment of COPD patients and is better correlated with disease progression.  CAT has a scoring range of zero to 40. The CAT score is classified into four groups of low (less than 10), medium (10 - 20),  high (21-30) and very high (31-40) based on the impact level of disease on health status. A CAT score over 10 suggests significant symptoms.  A worsening CAT score could be explained by an exacerbation, poor medication adherence, poor inhaler technique, or progression of COPD or comorbid conditions.  CAT MCID is 2 points  mMRC: mMRC (Modified Medical Research Council) Dyspnea Scale is used to assess the degree of baseline functional disability in patients of respiratory disease due to dyspnea. No minimal important difference is established. A decrease in score of 1 point or greater is considered a positive change.   Pulmonary Function Assessment:  Pulmonary Function Assessment - 12/09/22 0934       Breath   Shortness of Breath Yes;Panic with Shortness of Breath;Limiting activity             Exercise Target Goals: Exercise Program Goal: Individual exercise prescription set using results from initial 6 min walk test and THRR while considering  patient's activity barriers and safety.   Exercise Prescription Goal: Initial exercise prescription builds to 30-45 minutes a day of aerobic activity, 2-3 days per week.  Home exercise guidelines will be given to patient during program as part of exercise prescription that the participant will acknowledge.  Activity Barriers & Risk Stratification:  Activity Barriers & Cardiac Risk Stratification - 12/09/22 0935       Activity Barriers & Cardiac Risk Stratification   Activity Barriers Deconditioning;Muscular Weakness;Shortness of Breath;Assistive Device   paralyzed diaphragm            6 Minute Walk:  6 Minute Walk     Row Name 12/09/22 1035         6 Minute Walk   Phase Initial     Distance 960 feet     Walk Time 6 minutes     # of Rest Breaks 0     MPH 1.82     METS 2.07     RPE 7     Perceived Dyspnea  0.5     VO2 Peak 7.23     Symptoms No     Resting HR 64  bpm     Resting BP 108/68     Resting Oxygen Saturation  100  %     Exercise Oxygen Saturation  during 6 min walk 92 %     Max Ex. HR 96 bpm     Max Ex. BP 122/66     2 Minute Post BP 120/62       Interval HR   1 Minute HR 69     2 Minute HR 80     3 Minute HR 80     4 Minute HR 96     5 Minute HR 88     6 Minute HR 93     2 Minute Post HR 62     Interval Heart Rate? Yes       Interval Oxygen   Interval Oxygen? Yes     Baseline Oxygen Saturation % 100 %     1 Minute Oxygen Saturation % 94 %     1 Minute Liters of Oxygen 0 L     2 Minute Oxygen Saturation % 94 %     2 Minute Liters of Oxygen 0 L     3 Minute Oxygen Saturation % 99 %     3 Minute Liters of Oxygen 0 L     4 Minute Oxygen Saturation % 94 %     4 Minute Liters of Oxygen 0 L     5 Minute Oxygen Saturation % 92 %     5 Minute Liters of Oxygen 0 L     6 Minute Oxygen Saturation % 94 %     6 Minute Liters of Oxygen 0 L     2 Minute Post Oxygen Saturation % 99 %     2 Minute Post Liters of Oxygen 0 L              Oxygen Initial Assessment:  Oxygen Initial Assessment - 12/10/22 1653       Home Oxygen   Home Oxygen Device None    Sleep Oxygen Prescription BiPAP    Home Exercise Oxygen Prescription None    Home Resting Oxygen Prescription None      Initial 6 min Walk   Oxygen Used None      Program Oxygen Prescription   Program Oxygen Prescription None      Intervention   Short Term Goals To learn and understand importance of maintaining oxygen saturations>88%;To learn and understand importance of monitoring SPO2 with pulse oximeter and demonstrate accurate use of the pulse oximeter.;To learn and demonstrate proper pursed lip breathing techniques or other breathing techniques.     Long  Term Goals Verbalizes importance of monitoring SPO2 with pulse oximeter and return demonstration;Maintenance of O2 saturations>88%;Exhibits proper breathing techniques, such as pursed lip breathing or other method taught during program session;Compliance with respiratory  medication;Demonstrates proper use of MDI's             Oxygen Re-Evaluation:  Oxygen Re-Evaluation     Row Name 12/10/22 1653 01/02/23 1650           Program Oxygen Prescription   Program Oxygen Prescription -- None        Home Oxygen   Home Oxygen Device -- None      Sleep Oxygen Prescription -- BiPAP      Home Exercise Oxygen Prescription -- None      Home Resting Oxygen Prescription -- None        Goals/Expected Outcomes   Short Term  Goals -- To learn and understand importance of maintaining oxygen saturations>88%;To learn and understand importance of monitoring SPO2 with pulse oximeter and demonstrate accurate use of the pulse oximeter.;To learn and demonstrate proper pursed lip breathing techniques or other breathing techniques.       Long  Term Goals -- Verbalizes importance of monitoring SPO2 with pulse oximeter and return demonstration;Maintenance of O2 saturations>88%;Exhibits proper breathing techniques, such as pursed lip breathing or other method taught during program session;Compliance with respiratory medication;Demonstrates proper use of MDI's      Goals/Expected Outcomes Compliance and understanding of oxygen saturation monitoring and breathing techniques to decrease shortness of breath. Compliance and understanding of oxygen saturation monitoring and breathing techniques to decrease shortness of breath.               Oxygen Discharge (Final Oxygen Re-Evaluation):  Oxygen Re-Evaluation - 01/02/23 1650       Program Oxygen Prescription   Program Oxygen Prescription None      Home Oxygen   Home Oxygen Device None    Sleep Oxygen Prescription BiPAP    Home Exercise Oxygen Prescription None    Home Resting Oxygen Prescription None      Goals/Expected Outcomes   Short Term Goals To learn and understand importance of maintaining oxygen saturations>88%;To learn and understand importance of monitoring SPO2 with pulse oximeter and demonstrate accurate use of  the pulse oximeter.;To learn and demonstrate proper pursed lip breathing techniques or other breathing techniques.     Long  Term Goals Verbalizes importance of monitoring SPO2 with pulse oximeter and return demonstration;Maintenance of O2 saturations>88%;Exhibits proper breathing techniques, such as pursed lip breathing or other method taught during program session;Compliance with respiratory medication;Demonstrates proper use of MDI's    Goals/Expected Outcomes Compliance and understanding of oxygen saturation monitoring and breathing techniques to decrease shortness of breath.             Initial Exercise Prescription:  Initial Exercise Prescription - 12/09/22 1000       Date of Initial Exercise RX and Referring Provider   Date 12/09/22    Referring Provider George Wallace    Expected Discharge Date 03/06/23      Recumbant Elliptical   Level 1    RPM 20    Minutes 15      Track   Minutes 15    METs 2.07      Prescription Details   Frequency (times per week) 2    Duration Progress to 30 minutes of continuous aerobic without signs/symptoms of physical distress      Intensity   THRR 40-80% of Max Heartrate 81-129    Ratings of Perceived Exertion 11-13    Perceived Dyspnea 0-4      Progression   Progression Continue to progress workloads to maintain intensity without signs/symptoms of physical distress.      Resistance Training   Training Prescription Yes    Weight blue bands    Reps 10-15             Perform Capillary Blood Glucose checks as needed.  Exercise Prescription Changes:   Exercise Prescription Changes     Row Name 12/24/22 1200             Response to Exercise   Blood Pressure (Admit) 112/70       Blood Pressure (Exercise) 140/70       Blood Pressure (Exit) 110/70       Heart Rate (Admit) 79 bpm  Heart Rate (Exercise) 107 bpm       Heart Rate (Exit) 85 bpm       Oxygen Saturation (Admit) 99 %       Oxygen Saturation (Exercise) 96 %        Oxygen Saturation (Exit) 99 %       Rating of Perceived Exertion (Exercise) 11       Perceived Dyspnea (Exercise) 1       Duration Continue with 30 min of aerobic exercise without signs/symptoms of physical distress.       Intensity THRR unchanged         Progression   Progression Continue to progress workloads to maintain intensity without signs/symptoms of physical distress.         Resistance Training   Training Prescription Yes       Weight blue bands       Reps 10-15         Treadmill   MPH 2.5       Grade 1.5       Minutes 15       METs 3.43         NuStep   Level 2       Minutes 15       METs 1.6                Exercise Comments:   Exercise Comments     Row Name 12/17/22 1212           Exercise Comments Pt completed 1st day of exercise. He exercised for 15 min on the track and Nustep. [place] averaged 2.54 METs on the track and 1.7 METs at level 2 on the Nustep. He performed the warmup and cooldown standing without limitations. Discussed METs.                Exercise Goals and Review:   Exercise Goals     Row Name 12/09/22 0935 12/10/22 1651 01/02/23 1647         Exercise Goals   Increase Physical Activity Yes Yes Yes     Intervention Provide advice, education, support and counseling about physical activity/exercise needs.;Develop an individualized exercise prescription for aerobic and resistive training based on initial evaluation findings, risk stratification, comorbidities and participant's personal goals. Provide advice, education, support and counseling about physical activity/exercise needs.;Develop an individualized exercise prescription for aerobic and resistive training based on initial evaluation findings, risk stratification, comorbidities and participant's personal goals. Provide advice, education, support and counseling about physical activity/exercise needs.;Develop an individualized exercise prescription for aerobic and resistive  training based on initial evaluation findings, risk stratification, comorbidities and participant's personal goals.     Expected Outcomes Short Term: Attend rehab on a regular basis to increase amount of physical activity.;Long Term: Add in home exercise to make exercise part of routine and to increase amount of physical activity.;Long Term: Exercising regularly at least 3-5 days a week. Short Term: Attend rehab on a regular basis to increase amount of physical activity.;Long Term: Add in home exercise to make exercise part of routine and to increase amount of physical activity.;Long Term: Exercising regularly at least 3-5 days a week. Short Term: Attend rehab on a regular basis to increase amount of physical activity.;Long Term: Add in home exercise to make exercise part of routine and to increase amount of physical activity.;Long Term: Exercising regularly at least 3-5 days a week.     Increase Strength and Stamina Yes Yes Yes  Intervention Provide advice, education, support and counseling about physical activity/exercise needs.;Develop an individualized exercise prescription for aerobic and resistive training based on initial evaluation findings, risk stratification, comorbidities and participant's personal goals. Provide advice, education, support and counseling about physical activity/exercise needs.;Develop an individualized exercise prescription for aerobic and resistive training based on initial evaluation findings, risk stratification, comorbidities and participant's personal goals. Provide advice, education, support and counseling about physical activity/exercise needs.;Develop an individualized exercise prescription for aerobic and resistive training based on initial evaluation findings, risk stratification, comorbidities and participant's personal goals.     Expected Outcomes Short Term: Increase workloads from initial exercise prescription for resistance, speed, and METs.;Short Term: Perform  resistance training exercises routinely during rehab and add in resistance training at home;Long Term: Improve cardiorespiratory fitness, muscular endurance and strength as measured by increased METs and functional capacity (6MWT) Short Term: Increase workloads from initial exercise prescription for resistance, speed, and METs.;Short Term: Perform resistance training exercises routinely during rehab and add in resistance training at home;Long Term: Improve cardiorespiratory fitness, muscular endurance and strength as measured by increased METs and functional capacity (6MWT) Short Term: Increase workloads from initial exercise prescription for resistance, speed, and METs.;Short Term: Perform resistance training exercises routinely during rehab and add in resistance training at home;Long Term: Improve cardiorespiratory fitness, muscular endurance and strength as measured by increased METs and functional capacity (6MWT)     Able to understand and use rate of perceived exertion (RPE) scale Yes Yes Yes     Intervention Provide education and explanation on how to use RPE scale Provide education and explanation on how to use RPE scale Provide education and explanation on how to use RPE scale     Expected Outcomes Short Term: Able to use RPE daily in rehab to express subjective intensity level;Long Term:  Able to use RPE to guide intensity level when exercising independently Short Term: Able to use RPE daily in rehab to express subjective intensity level;Long Term:  Able to use RPE to guide intensity level when exercising independently Short Term: Able to use RPE daily in rehab to express subjective intensity level;Long Term:  Able to use RPE to guide intensity level when exercising independently     Able to understand and use Dyspnea scale Yes Yes Yes     Intervention Provide education and explanation on how to use Dyspnea scale Provide education and explanation on how to use Dyspnea scale Provide education and  explanation on how to use Dyspnea scale     Expected Outcomes Short Term: Able to use Dyspnea scale daily in rehab to express subjective sense of shortness of breath during exertion;Long Term: Able to use Dyspnea scale to guide intensity level when exercising independently Short Term: Able to use Dyspnea scale daily in rehab to express subjective sense of shortness of breath during exertion;Long Term: Able to use Dyspnea scale to guide intensity level when exercising independently Short Term: Able to use Dyspnea scale daily in rehab to express subjective sense of shortness of breath during exertion;Long Term: Able to use Dyspnea scale to guide intensity level when exercising independently     Knowledge and understanding of Target Heart Rate Range (THRR) Yes Yes Yes     Intervention Provide education and explanation of THRR including how the numbers were predicted and where they are located for reference Provide education and explanation of THRR including how the numbers were predicted and where they are located for reference Provide education and explanation of THRR including how the numbers  were predicted and where they are located for reference     Expected Outcomes Short Term: Able to state/look up THRR;Long Term: Able to use THRR to govern intensity when exercising independently;Short Term: Able to use daily as guideline for intensity in rehab Short Term: Able to state/look up THRR;Long Term: Able to use THRR to govern intensity when exercising independently;Short Term: Able to use daily as guideline for intensity in rehab Short Term: Able to state/look up THRR;Long Term: Able to use THRR to govern intensity when exercising independently;Short Term: Able to use daily as guideline for intensity in rehab     Understanding of Exercise Prescription Yes Yes Yes     Intervention Provide education, explanation, and written materials on patient's individual exercise prescription Provide education, explanation, and  written materials on patient's individual exercise prescription Provide education, explanation, and written materials on patient's individual exercise prescription     Expected Outcomes Short Term: Able to explain program exercise prescription;Long Term: Able to explain home exercise prescription to exercise independently Short Term: Able to explain program exercise prescription;Long Term: Able to explain home exercise prescription to exercise independently Short Term: Able to explain program exercise prescription;Long Term: Able to explain home exercise prescription to exercise independently              Exercise Goals Re-Evaluation :  Exercise Goals Re-Evaluation     Row Name 12/10/22 1651 01/02/23 1648           Exercise Goal Re-Evaluation   Exercise Goals Review Increase Physical Activity;Able to understand and use Dyspnea scale;Understanding of Exercise Prescription;Increase Strength and Stamina;Knowledge and understanding of Target Heart Rate Range (THRR);Able to understand and use rate of perceived exertion (RPE) scale Increase Physical Activity;Able to understand and use Dyspnea scale;Understanding of Exercise Prescription;Increase Strength and Stamina;Knowledge and understanding of Target Heart Rate Range (THRR);Able to understand and use rate of perceived exertion (RPE) scale      Comments Kellyn is scheduled to begin exercise this week. Will continue to monitor and progress as able. Dohn has completed 6 exercise sessions. He exercises for 15 min on the treadmill and Nustep. He averages 3.24 METs on the treadmill and 1.8 METs at level 3 on the Nustep. Khalid performs the warmup and cooldown standing without limitations. Shoichi has been progressed from track walking to the treadmill. He tolerates the treadmill well. He has increased his workload for both exercise modes as vital sign have maintained. Wetzel seems very motivated to exercise. I believe he enjoys coming to rehab and  conversing with other patients. Will continue to monitor and progress as able.      Expected Outcomes Through exercise at rehab and home, the patient will decrease shortness of breath with daily activities and feel confident in carrying out an exercise regimen at home. Through exercise at rehab and home, the patient will decrease shortness of breath with daily activities and feel confident in carrying out an exercise regimen at home.               Discharge Exercise Prescription (Final Exercise Prescription Changes):  Exercise Prescription Changes - 12/24/22 1200       Response to Exercise   Blood Pressure (Admit) 112/70    Blood Pressure (Exercise) 140/70    Blood Pressure (Exit) 110/70    Heart Rate (Admit) 79 bpm    Heart Rate (Exercise) 107 bpm    Heart Rate (Exit) 85 bpm    Oxygen Saturation (Admit) 99 %    Oxygen  Saturation (Exercise) 96 %    Oxygen Saturation (Exit) 99 %    Rating of Perceived Exertion (Exercise) 11    Perceived Dyspnea (Exercise) 1    Duration Continue with 30 min of aerobic exercise without signs/symptoms of physical distress.    Intensity THRR unchanged      Progression   Progression Continue to progress workloads to maintain intensity without signs/symptoms of physical distress.      Resistance Training   Training Prescription Yes    Weight blue bands    Reps 10-15      Treadmill   MPH 2.5    Grade 1.5    Minutes 15    METs 3.43      NuStep   Level 2    Minutes 15    METs 1.6             Nutrition:  Target Goals: Understanding of nutrition guidelines, daily intake of sodium '1500mg'$ , cholesterol '200mg'$ , calories 30% from fat and 7% or less from saturated fats, daily to have 5 or more servings of fruits and vegetables.  Biometrics:  Pre Biometrics - 12/09/22 0927       Pre Biometrics   Grip Strength 31 kg              Nutrition Therapy Plan and Nutrition Goals:  Nutrition Therapy & Goals - 12/17/22 1132        Nutrition Therapy   Diet Heart Healthy/Carbohydrate Consistent diet    Drug/Food Interactions Statins/Certain Fruits      Personal Nutrition Goals   Nutrition Goal Patient to improve diet quality by using the plate method as a daily guide for meal planning to include lean protein/plant protein, fruits, vegetables, whole grains, nonfat dairy as part of well balanced diet    Personal Goal #2 Patient to identify food sources and limit daily intake of saturated fat, sodium, refined carbohydrates    Personal Goal #3 Patient to reduce sodium to <2000 mg per day    Comments 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 He will need his BiPAP for life to prevent CO2 narcosis. He does report poor sleep due to sleeping in recliner with ~2 hours/night. He does enjoy cooking; discussed reading food labels for sodium and salt free seasoning blends. Nasean will benefit from participation in pulmonary rehab for nutrition, exercise, and lifestyle modfication.      Intervention Plan   Intervention Prescribe, educate and counsel regarding individualized specific dietary modifications aiming towards targeted core components such as weight, hypertension, lipid management, diabetes, heart failure and other comorbidities.;Nutrition handout(s) given to patient.    Expected Outcomes Short Term Goal: Understand basic principles of dietary content, such as calories, fat, sodium, cholesterol and nutrients.;Long Term Goal: Adherence to prescribed nutrition plan.             Nutrition Assessments:  MEDIFICTS Score Key: ?70 Need to make dietary changes  40-70 Heart Healthy Diet ? 40 Therapeutic Level Cholesterol Diet   Picture Your Plate Scores: [id number] Unhealthy dietary pattern with much room for improvement. 41-50 Dietary pattern unlikely to meet recommendations for good health and room for improvement. 51-60 More healthful dietary pattern, with some room for  improvement.  >60 Healthy dietary pattern, although there may be some specific behaviors that could be improved.    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 He will need his BiPAP for life to prevent CO2 narcosis. He does report poor sleep due to sleeping in recliner with ~2 hours/night. He does enjoy cooking; discussed reading food labels for sodium and salt free seasoning blends. [place] will benefit from participation in pulmonary rehab for nutrition, exercise, and lifestyle modfication.                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 He will need his BiPAP for life to prevent CO2 narcosis. He does report poor sleep due to sleeping in recliner with ~2 hours/night. He does enjoy cooking; discussed reading food labels for sodium and salt free seasoning blends. Cashtyn will benefit from participation in pulmonary rehab for nutrition, exercise, and lifestyle modfication.             Psychosocial: Target Goals: Acknowledge presence or absence of significant depression and/or stress, maximize coping skills, provide positive support system. Participant is able to verbalize types and ability to use techniques and skills needed for reducing stress and depression.  Initial Review & Psychosocial Screening:  Initial Psych Review & Screening - 12/09/22 0937       Initial Review   Current issues with Current Sleep Concerns    Comments Pt has hard time going to sleep      Family Dynamics   Good  Support System? Yes    Comments wife and friends      Barriers   Psychosocial barriers to participate in program The patient should benefit from training in stress management and relaxation.      Screening Interventions   Interventions Encouraged to exercise;To provide support and resources with identified psychosocial needs    Expected Outcomes Long Term Goal: Stressors or current issues are controlled or eliminated.             Quality of Life Scores:  Scores of 19 and below usually indicate a poorer quality of life in these areas.  A difference of  2-3 points is a clinically meaningful difference.  A difference of 2-3 points in the total score of the Quality of Life Index has been associated with significant improvement in overall quality of life, self-image, physical symptoms, and general health in studies assessing change in quality of life.  PHQ-9: Review Flowsheet       12/09/2022  Depression screen PHQ 2/9  Decreased Interest 0  Down, Depressed, Hopeless 0  PHQ - 2 Score 0  Altered sleeping 0  Tired, decreased energy 0  Change in appetite 0  Feeling bad or failure about yourself  0  Trouble concentrating 0  Moving slowly or fidgety/restless 0  Suicidal thoughts 0  PHQ-9 Score 0  Difficult doing work/chores Not difficult at all   Interpretation of Total Score  Total Score Depression Severity:  1-4 = Minimal depression, 5-9 = Mild depression, 10-14 = Moderate depression, 15-19 = Moderately severe depression, 20-27 = Severe depression   Psychosocial Evaluation and Intervention:  Psychosocial Evaluation - 01/01/23 1131       Psychosocial Evaluation & Interventions  Interventions Stress management education;Relaxation education;Encouraged to exercise with the program and follow exercise prescription    Comments Pt denies anxiety or depression. He admits he has a hard time sleeping.    Expected Outcomes For Bransen to participate in PR free of psychosocial barriers     Continue Psychosocial Services  Follow up required by staff             Psychosocial Re-Evaluation:  Psychosocial Re-Evaluation     [place] Name 12/09/22 1251 01/01/23 1132           Psychosocial Re-Evaluation   Current issues with Current Sleep Concerns None Identified      Comments Tyrece completed his orientation 2/12 and his first class is scheduled for 2/20. Marqavious has attended  5 PR classes so far. Johne reports that he is sleeping better.      Expected Outcomes For Shirl to participate in PR and get better sleep with increased exercise. For pt to participate in PR free of psycosocial concerns      Interventions Encouraged to attend Pulmonary Rehabilitation for the exercise;Relaxation education Encouraged to attend Pulmonary Rehabilitation for the exercise      Continue Psychosocial Services  Follow up required by staff  We will continue to monitor and assess Tico for sleep concerns No Follow up required               Psychosocial Discharge (Final Psychosocial Re-Evaluation):  Psychosocial Re-Evaluation - 01/01/23 1132       Psychosocial Re-Evaluation   Current issues with None Identified    Comments Raymund has attended  5 PR classes so far. Kullen reports that he is sleeping better.    Expected Outcomes For pt to participate in PR free of psycosocial concerns    Interventions Encouraged to attend Pulmonary Rehabilitation for the exercise    Continue Psychosocial Services  No Follow up required             Education: Education Goals: Education classes will be provided on a weekly basis, covering required topics. Participant will state understanding/return demonstration of topics presented.  Learning Barriers/Preferences:  Learning Barriers/Preferences - 12/09/22 0939       Learning Barriers/Preferences   Learning Barriers None    Learning Preferences Pictoral;Skilled Demonstration;Video;Written Material;Computer/Internet              Education Topics: Introduction to Pulmonary Rehab Group instruction provided by PowerPoint, verbal discussion, and written material to support subject matter. Instructor reviews what Pulmonary Rehab is, the purpose of the program, and how patients are referred.     Know Your Numbers Group instruction that is supported by a PowerPoint presentation. Instructor discusses importance of knowing and understanding resting, exercise, and post-exercise oxygen saturation, heart rate, and blood pressure. Oxygen saturation, heart rate, blood pressure, rating of perceived exertion, and dyspnea are reviewed along with a normal range for these values.    Exercise for the Pulmonary Patient Group instruction that is supported by a PowerPoint presentation. Instructor discusses benefits of exercise, core components of exercise, frequency, duration, and intensity of an exercise routine, importance of utilizing pulse oximetry during exercise, safety while exercising, and options of places to exercise outside of rehab.       MET Level  Group instruction provided by PowerPoint, verbal discussion, and written material to support subject matter. Instructor reviews what METs are and how to increase METs.  Flowsheet Row PULMONARY REHAB OTHER RESPIRATORY from 12/17/2022 in [facility] for [facility], Vascular, & Lung  Health  Date 12/17/22  Educator EP  Instruction Review Code 1- Verbalizes Understanding       Pulmonary Medications Verbally interactive group education provided by instructor with focus on inhaled medications and proper administration.   Anatomy and Physiology of the Respiratory System Group instruction provided by PowerPoint, verbal discussion, and written material to support subject matter. Instructor reviews respiratory cycle and anatomical components of the respiratory system and their functions. Instructor also reviews differences in obstructive and restrictive  respiratory diseases with examples of each.    Oxygen Safety Group instruction provided by PowerPoint, verbal discussion, and written material to support subject matter. There is an overview of "What is Oxygen" and "Why do we need it".  Instructor also reviews how to create a safe environment for oxygen use, the importance of using oxygen as prescribed, and the risks of noncompliance. There is a brief discussion on traveling with oxygen and resources the patient may utilize.   Oxygen Use Group instruction provided by PowerPoint, verbal discussion, and written material to discuss how supplemental oxygen is prescribed and different types of oxygen supply systems. Resources for more information are provided.    Breathing Techniques Group instruction that is supported by demonstration and informational handouts. Instructor discusses the benefits of pursed lip and diaphragmatic breathing and detailed demonstration on how to perform both.     Risk Factor Reduction Group instruction that is supported by a PowerPoint presentation. Instructor discusses the definition of a risk factor, different risk factors for pulmonary disease, and how the heart and lungs work together.   MD Day A group question and answer session with a medical doctor that allows participants to ask questions that relate to their pulmonary disease state.   Nutrition for the Pulmonary Patient Group instruction provided by PowerPoint slides, verbal discussion, and written materials to support subject matter. The instructor gives an explanation and review of healthy diet recommendations, which includes a discussion on weight management, recommendations for fruit and vegetable consumption, as well as protein, fluid, caffeine, fiber, sodium, sugar, and alcohol. Tips for eating when patients are short of breath are discussed.    Other Education Group or individual verbal, written, or video instructions that support the educational  goals of the pulmonary rehab program. Flowsheet Row PULMONARY REHAB OTHER RESPIRATORY from 12/24/2022 in [facility] for Heart, Vascular, & [place]  Date 12/24/22  Horatio Pel the Sedentary Lifestyle]  Educator EP  Instruction Review Code 1- Verbalizes Understanding  [Hand out provided]        Knowledge Questionnaire Score:  Knowledge Questionnaire Score - 12/09/22 1000       Knowledge Questionnaire Score   Pre Score 16/18             Core Components/Risk Factors/Patient Goals at Admission:  Personal Goals and Risk Factors at Admission - 12/09/22 0939       Core Components/Risk Factors/Patient Goals on Admission    Weight Management Weight Loss;Yes    Intervention Weight Management: Develop a combined nutrition and exercise program designed to reach desired caloric intake, while maintaining appropriate intake of nutrient and fiber, sodium and fats, and appropriate energy expenditure required for the weight goal.;Weight Management: Provide education and appropriate resources to help participant work on and attain dietary goals.;Weight Management/Obesity: Establish reasonable short term and long term weight goals.;Obesity: Provide education and appropriate resources to help participant work on and attain dietary goals.    Admit Weight 271 lb 9.7 oz (123.2 kg)    Expected  Outcomes Short Term: Continue to assess and modify interventions until short term weight is achieved;Long Term: Adherence to nutrition and physical activity/exercise program aimed toward attainment of established weight goal;Weight Maintenance: Understanding of the daily nutrition guidelines, which includes 25-35% calories from fat, 7% or less cal from saturated fats, less than '200mg'$  cholesterol, less than 1.5gm of sodium, & 5 or more servings of fruits and vegetables daily;Weight Loss: Understanding of general recommendations for a balanced deficit meal plan, which promotes 1-2 lb weight loss per  week and includes a negative energy balance of [phone] kcal/d;Understanding recommendations for meals to include 15-35% energy as protein, 25-35% energy from fat, 35-60% energy from carbohydrates, less than '200mg'$  of dietary cholesterol, 20-35 gm of total fiber daily;Understanding of distribution of calorie intake throughout the day with the consumption of 4-5 meals/snacks    Improve shortness of breath with ADL's Yes    Intervention Provide education, individualized exercise plan and daily activity instruction to help decrease symptoms of SOB with activities of daily living.    Expected Outcomes Short Term: Improve cardiorespiratory fitness to achieve a reduction of symptoms when performing ADLs;Long Term: Be able to perform more ADLs without symptoms or delay the onset of symptoms             Core Components/Risk Factors/Patient Goals Review:   Goals and Risk Factor Review     Row Name 12/09/22 1254 01/01/23 1136           Core Components/Risk Factors/Patient Goals Review   Personal Goals Review Weight Management/Obesity;Improve shortness of breath with ADL's;Develop more efficient breathing techniques such as purse lipped breathing and diaphragmatic breathing and practicing self-pacing with activity. Develop more efficient breathing techniques such as purse lipped breathing and diaphragmatic breathing and practicing self-pacing with activity.;Increase knowledge of respiratory medications and ability to use respiratory devices properly.;Improve shortness of breath with ADL's      Review Deavin is scheduled to start the program on 12/17/22 Shy has completed 5 PR sessions so far. He is currently exercising on the treadmill and the Nustep. He has been able to increase his intensity on the treadmill. He has also been able to increase his workload and METs on the NuStep. He is practicing pursed lip breathing. He has attended the warning signs and symptoms and beating the sedentary lifestyle  classes. Keahi has not been consistent with taking his medications. Staff reinterated the importance of being compliant with his medications. We will continue to assess any progress he makes during PR classes.      Expected Outcomes See Admission Goals See admission goals               Core Components/Risk Factors/Patient Goals at Discharge (Final Review):   Goals and Risk Factor Review - 01/01/23 1136       Core Components/Risk Factors/Patient Goals Review   Personal Goals Review Develop more efficient breathing techniques such as purse lipped breathing and diaphragmatic breathing and practicing self-pacing with activity.;Increase knowledge of respiratory medications and ability to use respiratory devices properly.;Improve shortness of breath with ADL's    Review Kayl has completed 5 PR sessions so far. He is currently exercising on the treadmill and the Nustep. He has been able to increase his intensity on the treadmill. He has also been able to increase his workload and METs on the NuStep. He is practicing pursed lip breathing. He has attended the warning signs and symptoms and beating the sedentary lifestyle classes. Apollos has not been consistent with taking  his medications. Staff reinterated the importance of being compliant with his medications. We will continue to assess any progress he makes during PR classes.    Expected Outcomes See admission goals             ITP Comments:   Comments: Dr. Rodman Pickle is Medical Director for Pulmonary Rehab at [facility].

## 2023-01-09 ENCOUNTER — Encounter (HOSPITAL_COMMUNITY): Payer: BC Managed Care – PPO

## 2023-01-10 ENCOUNTER — Ambulatory Visit (INDEPENDENT_AMBULATORY_CARE_PROVIDER_SITE_OTHER): Payer: BC Managed Care – PPO | Admitting: Thoracic Surgery (Cardiothoracic Vascular Surgery)

## 2023-01-10 ENCOUNTER — Encounter: Payer: Self-pay | Admitting: Thoracic Surgery (Cardiothoracic Vascular Surgery)

## 2023-01-10 VITALS — BP 134/81 | HR 89 | Resp 20 | Ht 69.0 in | Wt 267.0 lb

## 2023-01-10 DIAGNOSIS — J9859 Other diseases of mediastinum, not elsewhere classified: Secondary | ICD-10-CM

## 2023-01-10 DIAGNOSIS — J986 Disorders of diaphragm: Secondary | ICD-10-CM

## 2023-01-10 NOTE — Progress Notes (Signed)
      [address]       ,[place] [address]             [phone]        Jesselee Bro[place] Medical Record [id number] Date of Birth: Oct 15, 1961  Referring: Cari Caraway, MD Primary Care: Cari Caraway, MD Primary Cardiologist:None  Reason for visit:   follow-up  History of Present Illness:     62 year old presents in follow-up after undergoing a thymectomy.  He complains of incisional pain at one of the port sites.  He is currently in pulmonary rehab and is doing well with this.  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  In regards to his incisional pain have instructed him to perform warm compresses and massaging daily.  He is unsure if he wants to proceed with a plication diaphragm.  He will call us back in the future if he changes his mind.   Lajuana Matte 01/10/2023 3:20 PM

## 2023-01-14 ENCOUNTER — Encounter (HOSPITAL_COMMUNITY)
Admission: RE | Admit: 2023-01-14 | Discharge: 2023-01-14 | Disposition: A | Discharge status: Home / Self Care | Payer: BC Managed Care – PPO | Source: Ambulatory Visit | Attending: Internal Medicine | Admitting: Internal Medicine

## 2023-01-14 VITALS — Wt 265.2 lb

## 2023-01-14 DIAGNOSIS — R600 Localized edema: Secondary | ICD-10-CM | POA: Diagnosis not present

## 2023-01-14 DIAGNOSIS — I480 Paroxysmal atrial fibrillation: Secondary | ICD-10-CM | POA: Diagnosis not present

## 2023-01-14 DIAGNOSIS — E785 Hyperlipidemia, unspecified: Secondary | ICD-10-CM | POA: Diagnosis not present

## 2023-01-14 DIAGNOSIS — R0602 Shortness of breath: Secondary | ICD-10-CM | POA: Diagnosis not present

## 2023-01-14 DIAGNOSIS — Z79899 Other long term (current) drug therapy: Secondary | ICD-10-CM | POA: Diagnosis not present

## 2023-01-14 DIAGNOSIS — R931 Abnormal findings on diagnostic imaging of heart and coronary circulation: Secondary | ICD-10-CM | POA: Diagnosis not present

## 2023-01-14 DIAGNOSIS — I4891 Unspecified atrial fibrillation: Secondary | ICD-10-CM | POA: Diagnosis not present

## 2023-01-14 DIAGNOSIS — E119 Type 2 diabetes mellitus without complications: Secondary | ICD-10-CM | POA: Diagnosis not present

## 2023-01-14 DIAGNOSIS — J9859 Other diseases of mediastinum, not elsewhere classified: Secondary | ICD-10-CM | POA: Diagnosis not present

## 2023-01-14 DIAGNOSIS — I2510 Atherosclerotic heart disease of native coronary artery without angina pectoris: Secondary | ICD-10-CM | POA: Diagnosis not present

## 2023-01-14 DIAGNOSIS — I700 Atherosclerosis of aorta: Secondary | ICD-10-CM | POA: Diagnosis not present

## 2023-01-14 DIAGNOSIS — J986 Disorders of diaphragm: Secondary | ICD-10-CM | POA: Diagnosis not present

## 2023-01-14 DIAGNOSIS — R0789 Other chest pain: Secondary | ICD-10-CM | POA: Diagnosis not present

## 2023-01-14 NOTE — Progress Notes (Signed)
Daily Session Note  Patient Details  Name: George Wallace MRN: [id number] Date of Birth: August 15, 1961 Referring Provider:   April Manson Pulmonary Rehab Walk Test from 12/09/2022 in [facility] for Heart, Vascular, & [place]  Referring Provider [name]       Encounter Date: 01/14/2023  Check In:  Session Check In - 01/14/23 1224       Check-In   Supervising physician immediately available to respond to emergencies [facility] MD immediately available    Physician(s) Eric Form, NP    Location [facility]    Staff Present Maurice Small, RN, Luisa Hart, RN, Quentin Ore, MS, ACSM-CEP, Exercise Physiologist;Samantha [place], RD, LDN;Other    Virtual Visit No    Medication changes reported     No    Fall or balance concerns reported    No    Tobacco Cessation No Change    Warm-up and Cool-down Performed as group-led instruction    Resistance Training Performed Yes    VAD Patient? No    PAD/SET Patient? No      Pain Assessment   Currently in Pain? No/denies    Multiple Pain Sites No             Capillary Blood Glucose: No results found for this or any previous visit (from the past 24 hour(s)).   Exercise Prescription Changes - 01/14/23 1200       Response to Exercise   Blood Pressure (Admit) 118/70    Blood Pressure (Exercise) 150/84    Blood Pressure (Exit) 118/76    Heart Rate (Admit) 89 bpm    Heart Rate (Exercise) 112 bpm    Heart Rate (Exit) 88 bpm    Oxygen Saturation (Admit) 100 %    Oxygen Saturation (Exercise) 97 %    Oxygen Saturation (Exit) 97 %    Rating of Perceived Exertion (Exercise) 10    Perceived Dyspnea (Exercise) 1    Duration Continue with 30 min of aerobic exercise without signs/symptoms of physical distress.    Intensity THRR unchanged      Resistance Training   Training Prescription Yes    Weight blue bands    Reps 10-15    Time 15 Minutes      Treadmill   MPH 2.5    Grade 3     Minutes 15    METs 3.95      NuStep   Level 3    SPM 70    Minutes 15    METs 2.2             Social History   Tobacco Use  Smoking Status Never  Smokeless Tobacco Never  Tobacco Comments   both parents smoked in home growing up.     Goals Met:  Proper associated with RPD/PD & O2 Sat Independence with exercise equipment Exercise tolerated well No report of concerns or symptoms today Strength training completed today  Goals Unmet:  Not Applicable  Comments: Service time is from 1025 to 1150.    Dr. Rodman Pickle is Medical Director for Pulmonary Rehab at [facility].

## 2023-01-15 DIAGNOSIS — E785 Hyperlipidemia, unspecified: Secondary | ICD-10-CM | POA: Diagnosis not present

## 2023-01-15 DIAGNOSIS — E1169 Type 2 diabetes mellitus with other specified complication: Secondary | ICD-10-CM | POA: Diagnosis not present

## 2023-01-15 DIAGNOSIS — J984 Other disorders of lung: Secondary | ICD-10-CM | POA: Diagnosis not present

## 2023-01-15 DIAGNOSIS — J986 Disorders of diaphragm: Secondary | ICD-10-CM | POA: Diagnosis not present

## 2023-01-15 DIAGNOSIS — Z8709 Personal history of other diseases of the respiratory system: Secondary | ICD-10-CM | POA: Diagnosis not present

## 2023-01-16 ENCOUNTER — Encounter (HOSPITAL_COMMUNITY)
Admission: RE | Admit: 2023-01-16 | Discharge: 2023-01-16 | Disposition: A | Discharge status: Home / Self Care | Payer: BC Managed Care – PPO | Source: Ambulatory Visit | Attending: Internal Medicine | Admitting: Internal Medicine

## 2023-01-16 DIAGNOSIS — R0602 Shortness of breath: Secondary | ICD-10-CM | POA: Diagnosis not present

## 2023-01-16 DIAGNOSIS — R0789 Other chest pain: Secondary | ICD-10-CM | POA: Diagnosis not present

## 2023-01-16 DIAGNOSIS — Z79899 Other long term (current) drug therapy: Secondary | ICD-10-CM | POA: Diagnosis not present

## 2023-01-16 DIAGNOSIS — E119 Type 2 diabetes mellitus without complications: Secondary | ICD-10-CM | POA: Diagnosis not present

## 2023-01-16 DIAGNOSIS — I700 Atherosclerosis of aorta: Secondary | ICD-10-CM | POA: Diagnosis not present

## 2023-01-16 DIAGNOSIS — I480 Paroxysmal atrial fibrillation: Secondary | ICD-10-CM | POA: Diagnosis not present

## 2023-01-16 DIAGNOSIS — I2510 Atherosclerotic heart disease of native coronary artery without angina pectoris: Secondary | ICD-10-CM | POA: Diagnosis not present

## 2023-01-16 DIAGNOSIS — R931 Abnormal findings on diagnostic imaging of heart and coronary circulation: Secondary | ICD-10-CM | POA: Diagnosis not present

## 2023-01-16 DIAGNOSIS — J986 Disorders of diaphragm: Secondary | ICD-10-CM | POA: Diagnosis not present

## 2023-01-16 DIAGNOSIS — R600 Localized edema: Secondary | ICD-10-CM | POA: Diagnosis not present

## 2023-01-16 DIAGNOSIS — E785 Hyperlipidemia, unspecified: Secondary | ICD-10-CM | POA: Diagnosis not present

## 2023-01-16 DIAGNOSIS — J9859 Other diseases of mediastinum, not elsewhere classified: Secondary | ICD-10-CM | POA: Diagnosis not present

## 2023-01-16 DIAGNOSIS — I4891 Unspecified atrial fibrillation: Secondary | ICD-10-CM | POA: Diagnosis not present

## 2023-01-16 NOTE — Progress Notes (Signed)
Daily Session Note  Patient Details  Name: George Wallace MRN: [id number] Date of Birth: 08-Jan-1961 Referring Provider:   April Manson Pulmonary Rehab Walk Test from 12/09/2022 in [facility] for Heart, Vascular, & [place]  Referring Provider [name]       Encounter Date: 01/16/2023  Check In:  Session Check In - 01/16/23 1212       Check-In   Supervising physician immediately available to respond to emergencies [facility] MD immediately available    Physician(s) Shaloub    [facility]    Staff Present Maurice Small, RN, Luisa Hart, RN, Quentin Ore, MS, ACSM-CEP, Exercise Physiologist;Samantha [place], RD, LDN;Other    Virtual Visit No    Medication changes reported     No    Fall or balance concerns reported    No    Tobacco Cessation No Change    Warm-up and Cool-down Performed as group-led instruction    Resistance Training Performed Yes    VAD Patient? No    PAD/SET Patient? No      Pain Assessment   Currently in Pain? No/denies    Multiple Pain Sites No             Capillary Blood Glucose: No results found for this or any previous visit (from the past 24 hour(s)).    Social History   Tobacco Use  Smoking Status Never  Smokeless Tobacco Never  Tobacco Comments   both parents smoked in home growing up.     Goals Met:  Proper associated with RPD/PD & O2 Sat Independence with exercise equipment Exercise tolerated well No report of concerns or symptoms today Strength training completed today  Goals Unmet:  Not Applicable  Comments: Service time is from 1030 to 1145.    Dr. Rodman Pickle is Medical Director for Pulmonary Rehab at [facility].

## 2023-01-17 DIAGNOSIS — I700 Atherosclerosis of aorta: Secondary | ICD-10-CM | POA: Diagnosis not present

## 2023-01-17 DIAGNOSIS — E119 Type 2 diabetes mellitus without complications: Secondary | ICD-10-CM | POA: Diagnosis not present

## 2023-01-17 DIAGNOSIS — J986 Disorders of diaphragm: Secondary | ICD-10-CM | POA: Diagnosis not present

## 2023-01-17 DIAGNOSIS — J9600 Acute respiratory failure, unspecified whether with hypoxia or hypercapnia: Secondary | ICD-10-CM | POA: Diagnosis not present

## 2023-01-21 ENCOUNTER — Ambulatory Visit (INDEPENDENT_AMBULATORY_CARE_PROVIDER_SITE_OTHER): Payer: BC Managed Care – PPO | Admitting: Acute Care

## 2023-01-21 ENCOUNTER — Encounter: Payer: Self-pay | Admitting: Acute Care

## 2023-01-21 ENCOUNTER — Encounter (HOSPITAL_COMMUNITY)
Admission: RE | Admit: 2023-01-21 | Discharge: 2023-01-21 | Disposition: A | Discharge status: Home / Self Care | Payer: BC Managed Care – PPO | Source: Ambulatory Visit | Attending: Internal Medicine | Admitting: Internal Medicine

## 2023-01-21 VITALS — BP 124/76 | HR 70 | Temp 98.3°F | Ht 69.0 in | Wt 265.8 lb

## 2023-01-21 DIAGNOSIS — E785 Hyperlipidemia, unspecified: Secondary | ICD-10-CM | POA: Diagnosis not present

## 2023-01-21 DIAGNOSIS — R600 Localized edema: Secondary | ICD-10-CM | POA: Diagnosis not present

## 2023-01-21 DIAGNOSIS — Z9989 Dependence on other enabling machines and devices: Secondary | ICD-10-CM | POA: Diagnosis not present

## 2023-01-21 DIAGNOSIS — R0789 Other chest pain: Secondary | ICD-10-CM | POA: Diagnosis not present

## 2023-01-21 DIAGNOSIS — I2510 Atherosclerotic heart disease of native coronary artery without angina pectoris: Secondary | ICD-10-CM | POA: Diagnosis not present

## 2023-01-21 DIAGNOSIS — R0602 Shortness of breath: Secondary | ICD-10-CM | POA: Diagnosis not present

## 2023-01-21 DIAGNOSIS — J9612 Chronic respiratory failure with hypercapnia: Secondary | ICD-10-CM

## 2023-01-21 DIAGNOSIS — R931 Abnormal findings on diagnostic imaging of heart and coronary circulation: Secondary | ICD-10-CM | POA: Diagnosis not present

## 2023-01-21 DIAGNOSIS — J984 Other disorders of lung: Secondary | ICD-10-CM | POA: Diagnosis not present

## 2023-01-21 DIAGNOSIS — J9859 Other diseases of mediastinum, not elsewhere classified: Secondary | ICD-10-CM | POA: Diagnosis not present

## 2023-01-21 DIAGNOSIS — J9610 Chronic respiratory failure, unspecified whether with hypoxia or hypercapnia: Secondary | ICD-10-CM | POA: Diagnosis not present

## 2023-01-21 DIAGNOSIS — E119 Type 2 diabetes mellitus without complications: Secondary | ICD-10-CM | POA: Diagnosis not present

## 2023-01-21 DIAGNOSIS — J986 Disorders of diaphragm: Secondary | ICD-10-CM

## 2023-01-21 DIAGNOSIS — I700 Atherosclerosis of aorta: Secondary | ICD-10-CM | POA: Diagnosis not present

## 2023-01-21 DIAGNOSIS — I480 Paroxysmal atrial fibrillation: Secondary | ICD-10-CM | POA: Diagnosis not present

## 2023-01-21 DIAGNOSIS — I4891 Unspecified atrial fibrillation: Secondary | ICD-10-CM | POA: Diagnosis not present

## 2023-01-21 DIAGNOSIS — Z79899 Other long term (current) drug therapy: Secondary | ICD-10-CM | POA: Diagnosis not present

## 2023-01-21 DIAGNOSIS — R5381 Other malaise: Secondary | ICD-10-CM

## 2023-01-21 NOTE — Progress Notes (Signed)
Daily Session Note  Patient Details  Name: Myshon Zwahlen MRN: [id number] Date of Birth: 08-16-1961 Referring Provider:   April Manson Pulmonary Rehab Walk Test from 12/09/2022 in [facility] for Heart, Vascular, & Lung Health  Referring Provider [name]       Encounter Date: 01/21/2023  Check In:  Session Check In - 01/21/23 1206       Check-In   Supervising physician immediately available to respond to emergencies [facility] MD immediately available    Physician(s) [place] Crane, PA    Location [facility]    Staff Present Janine Ores, RN, Quentin Ore, MS, ACSM-CEP, Exercise Physiologist;Samantha [place], RD, LDN;Kerby Borner Tamala Julian, RT;Randi Reeve BS, ACSM-CEP, Exercise Physiologist    Virtual Visit No    Medication changes reported     No    Fall or balance concerns reported    No    Tobacco Cessation No Change    Warm-up and Cool-down Performed as group-led instruction    Resistance Training Performed Yes    VAD Patient? No    PAD/SET Patient? No      Pain Assessment   Currently in Pain? No/denies    Multiple Pain Sites No             Capillary Blood Glucose: No results found for this or any previous visit (from the past 24 hour(s)).    Social History   Tobacco Use  Smoking Status Never  Smokeless Tobacco Never  Tobacco Comments   both parents smoked in home growing up.     Goals Met:  Proper associated with RPD/PD & O2 Sat Independence with exercise equipment Exercise tolerated well No report of concerns or symptoms today Strength training completed today  Goals Unmet:  Not Applicable  Comments: Service time is from 1025 to 1144.    Dr. Rodman Pickle is Medical Director for Pulmonary Rehab at [facility].

## 2023-01-21 NOTE — Progress Notes (Signed)
History of Present Illness George Wallace is a 62 y.o. 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 He will be followed by Dr. Halford Wallace.   Synopsis 62 y.o. 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  He was originally undergoing a CT scan for coronary calcium score, and a nodular mass was identified. 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 Kipp Wallace on 10/07/2022. Some of the left phrenic nerve was deeply invested into the tumor. It was taken en bloc . He has left hemidiaphragm elevation as a results of the left phrenic nerve removal. He was treated post op in the ICU , he was extubated the day of surgery, and placed on BiPAP. He had chest tubes with minimal output, they were removed 12/13. He was restarted on Lisinopril for his renal failure. When he was discharged from the hospital he was on [place] with BiPAP at night. He will need his BiPAP for life to prevent CO2 narcosis.    01/21/2023 Pt. Presents for follow up. He has been doing well. He states he does have to use his BiPAP at times while driving due to his position . He said this is not every day, just some days. Apparently there is not a sim card in his machine. His machine was switched out by his DME. The old machine did have a Sim care, but the new machine does not. We currently have no way of monitoring his therapy. We are calling the DME to see what we need to do to get either a Sim card or get the patient enrolled in [employer].   He states he is wearing therapy every  night . He states he occasionally does have an AM headache but he feels this is more related to neck pain. He feels this is really helping him. He has occasional sleepiness, but this is because he is not sleeping well at night.  He has still not returned to work. This is due to his shortness of breath . He is trying to decide if he wants to do the plication of his left hemidiaphragm . He will continue to discuss this with Dr. Kipp Wallace.   He is doing Pulmonary Rehab and he feels this is really helping him . He has not tried using the albuterol inhaler for his dyspnea. I have encouraged him to use this when he feel short of breath. He is working on his physical deconditioning. Goal is 150 minutes per week.He has less lower extremity edema.   Test Results: 12/11 - s/p sternotomy and resection of mediastinal mass; post op intubated 12/12 extubated 12/13 BIPAP intermittent 12/14 Afib on amio       Latest Ref Rng & Units 12/30/2022    3:04 PM 10/16/2022    5:45 AM 10/15/2022    6:08 AM  CBC  WBC 3.4 - 10.8 x10E3/uL 5.2  13.2  12.9   Hemoglobin 13.0 - 17.7 g/dL 13.4  11.5  11.0   Hematocrit 37.5 - 51.0 % 40.8  34.0  34.1   Platelets 150 - 450 x10E3/uL 348  437  389  Latest Ref Rng & Units 12/30/2022    3:04 PM 12/04/2022    7:55 AM 11/01/2022    9:30 AM  BMP  Glucose 70 - 99 mg/dL 113  111  116   BUN 6 - 24 mg/dL 10  10  14    Creatinine 0.76 - 1.27 mg/dL 1.11  1.04  0.94   BUN/Creat Ratio 9 - 20 9  10     Sodium 134 - 144 mmol/L 142  142  139   Potassium 3.5 - 5.2 mmol/L 3.9  4.3  4.5   Chloride 96 - 106 mmol/L 100  101  103   CO2 20 - 29 mmol/L 25  27  30    Calcium 8.7 - 10.2 mg/dL 9.8  10.0  9.1     BNP    Component Value Date/Time   BNP 7.1 02/15/2015 1500    ProBNP    Component Value Date/Time   PROBNP <36 11/27/2022 1221   PROBNP 9.0 11/01/2022 0930    PFT    Component Value Date/Time   FEV1PRE 0.90 10/17/2022 1656   FVCPRE 0.91 10/17/2022 1656   TLC 2.05  10/17/2022 1656   PREFEV1FVCRT 98 10/17/2022 1656    LONG TERM MONITOR (3-14 DAYS)  Result Date: 12/31/2022   Patch wear time was 13 days and 10 hours   Predominant rhythm was NSR with average HR 71bpm   Rare ectopy and no sustained arrhythmias   Overall, normal cardiac montior Patch Wear Time:  13 days and 10 hours (2024-02-04T22:43:56-0500 to 2024-02-18T09:23:13-0500) Patient had a min HR of 52 bpm, max HR of 125 bpm, and avg HR of 71 bpm. Predominant underlying rhythm was Sinus Rhythm. Isolated SVEs were rare (<1.0%), SVE Couplets were rare (<1.0%), and no SVE Triplets were present. Isolated VEs were rare (<1.0%), and no VE Couplets or VE Triplets were present. George Kaufman, MD     Past medical hx Past Medical History:  Diagnosis Date   Diabetes ([place])    Diet controlled   Hyperlipidemia      Social History   Tobacco Use   Smoking status: Never   Smokeless tobacco: Never   Tobacco comments:    both parents smoked in home growing up.   Vaping Use   Vaping Use: Never used  Substance Use Topics   Alcohol use: No    Alcohol/week: 0.0 standard drinks of alcohol   Drug use: No    George Wallace reports that he has never smoked. He has never used smokeless tobacco. He reports that he does not drink alcohol and does not use drugs.  Tobacco Cessation: Never smoker   Past surgical hx, Family hx, Social hx all reviewed.  Current Outpatient Medications on File Prior to Visit  Medication Sig   Accu-Chek Softclix Lancets lancets Use to check blood sugar daily   acetaminophen (TYLENOL) 500 MG tablet Take 1,000 mg by mouth every 6 (six) hours as needed for moderate pain.   albuterol (VENTOLIN HFA) 108 (90 Base) MCG/ACT inhaler Inhale 2 puffs into the lungs every 4 (four) hours as needed for wheezing.   aspirin EC 325 MG tablet Take 1 tablet (325 mg total) by mouth daily. (Patient taking differently: Take 325 mg by mouth as needed.)   Blood Glucose Monitoring Suppl (ACCU-CHEK GUIDE)  w/Device KIT Use to check blood sugar daily   furosemide (LASIX) 40 MG tablet Take 1 tablet (40 mg total) by mouth daily.   glucose blood (ACCU-CHEK GUIDE) test strip Use to check blood  sugar daily   ibuprofen (ADVIL) 200 MG tablet Take 600 mg by mouth as needed.   potassium chloride SA (KLOR-CON M) 20 MEQ tablet Take 1 tablet (20 mEq total) by mouth daily.   rosuvastatin (CRESTOR) 10 MG tablet Take 10 mg by mouth every evening.   Spacer/Aero-Holding Chambers (AEROCHAMBER PLUS FLO-VU LARGE) MISC 1 each by Other route once.   No current facility-administered medications on file prior to visit.     No Known Allergies  Review Of Systems:  Constitutional:   No  weight loss, night sweats,  Fevers, chills,+  fatigue, or  lassitude.  HEENT:   No headaches,  Difficulty swallowing,  Tooth/dental problems, or  Sore throat,                No sneezing, itching, ear ache, nasal congestion, post nasal drip,   CV:  No chest pain,  Orthopnea, PND, + but improved swelling in lower extremities, anasarca, No dizziness, palpitations, syncope.   GI  No heartburn, indigestion, abdominal pain, nausea, vomiting, diarrhea, change in bowel habits, loss of appetite, bloody stools.   Resp: + shortness of breath with exertion less at rest.  No excess mucus, no productive cough,  No non-productive cough,  No coughing up of blood.  No change in color of mucus.  No wheezing.  No chest wall deformity  Skin: no rash or lesions.  GU: no dysuria, change in color of urine, no urgency or frequency.  No flank pain, no hematuria   MS:  + joint pain or swelling.  + decreased range of motion.  No back pain.  Psych:  No change in mood or affect. + depression or anxiety.  No memory loss.   Vital Signs BP 124/76   Pulse 70   Temp 98.3 F (36.8 C) (Oral)   Ht 5\' 9"  (1.753 m)   Wt 265 lb 12.8 oz (120.6 kg)   SpO2 100%   BMI 39.25 kg/m    Physical Exam:  General- No distress,  A&Ox3, pleasant ENT: No sinus  tenderness, TM clear, pale nasal mucosa, no oral exudate,no post nasal drip, no LAN Cardiac: S1, S2, regular rate and rhythm, no murmur Chest: No wheeze/ rales/ dullness; no accessory muscle use, no nasal flaring, no sternal retractions Abd.: Soft Non-tender, ND, BS +, Body mass index is 39.25 kg/m.  Ext: No clubbing cyanosis, + bilateral ankle edema Neuro: Physical deconditioning, MAE x 4, A&O x 3 Skin: No rashes, warm and dry, no lesions  Psych: normal mood and behavior   Assessment/Plan SP resection of mediastinal mass and sternotomy  10/07/2022.  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  Continue Pulmonary Rehab to increase your physical stamina. Continue walking on Treadmill at home.  Follow up with Dr. Kipp Wallace for continued discussions regarding  plication of his left hemidiaphragm  Wear BiPAP every night and with naps Goal is to wear for at least 6 hours each night for maximal clinical benefit. Continue to work on weight loss, as the link between excess weight  and sleep apnea is  well established.   Remember to establish a good bedtime routine, and work on sleep hygiene.  Limit daytime naps , avoid stimulants such as caffeine and nicotine close to bedtime, exercise daily to promote sleep quality, avoid heavy , spicy, fried , or rich foods before bed. Ensure adequate exposure to natural light during the day,establish a relaxing bedtime routine with a pleasant sleep environment ( Bedroom between 60 and 67  degrees, turn off bright lights , TV or device screens screens , consider black out curtains or white noise machines) Do not drive if sleepy. Remember to clean mask, tubing, filter, and reservoir once weekly with soapy water.  Follow up with Dr. Ander Slade   In 2 months  or before as needed.    Please contact office for sooner follow up if symptoms do not improve or worsen or seek emergency care   I spent 40 minutes dedicated to the care of this patient on the date of this encounter to include pre-visit review of records, face-to-face time with the patient discussing conditions above, post visit ordering of testing, clinical documentation with the electronic health record, making appropriate referrals as documented, and communicating necessary information to the patient's healthcare team.     Magdalen Spatz, NP 01/21/2023  7:54 PM

## 2023-01-21 NOTE — Patient Instructions (Addendum)
It is good to see you today. We will work with your DME to get either a Sim Card or enroll you in [employer] so we can follow therapy for effectiveness.  Continue Pulmonary Rehab to increase your physical stamina. Continue walking on Treadmill at home.   Goal is to wear for at least 6 hours each night for maximal clinical benefit. Continue to work on weight loss, as the link between excess weight  and sleep apnea is well established.   Remember to establish a good bedtime routine, and work on sleep hygiene.  Limit daytime naps , avoid stimulants such as caffeine and nicotine close to bedtime, exercise daily to promote sleep quality, avoid heavy , spicy, fried , or rich foods before bed. Ensure adequate exposure to natural light during the day,establish a relaxing bedtime routine with a pleasant sleep environment ( Bedroom between 60 and 67 degrees, turn off bright lights , TV or device screens screens , consider black out curtains or white noise machines) Do not drive if sleepy. Remember to clean mask, tubing, filter, and reservoir once weekly with soapy water.  Follow up with Dr. Ander Slade   In 2 months  or before as needed.    Please contact office for sooner follow up if symptoms do not improve or worsen or seek emergency care

## 2023-01-23 ENCOUNTER — Encounter (HOSPITAL_COMMUNITY)
Admission: RE | Admit: 2023-01-23 | Discharge: 2023-01-23 | Disposition: A | Discharge status: Home / Self Care | Payer: BC Managed Care – PPO | Source: Ambulatory Visit | Attending: Internal Medicine | Admitting: Internal Medicine

## 2023-01-23 DIAGNOSIS — I700 Atherosclerosis of aorta: Secondary | ICD-10-CM | POA: Diagnosis not present

## 2023-01-23 DIAGNOSIS — I4891 Unspecified atrial fibrillation: Secondary | ICD-10-CM | POA: Diagnosis not present

## 2023-01-23 DIAGNOSIS — J9859 Other diseases of mediastinum, not elsewhere classified: Secondary | ICD-10-CM | POA: Diagnosis not present

## 2023-01-23 DIAGNOSIS — R0602 Shortness of breath: Secondary | ICD-10-CM | POA: Diagnosis not present

## 2023-01-23 DIAGNOSIS — R600 Localized edema: Secondary | ICD-10-CM | POA: Diagnosis not present

## 2023-01-23 DIAGNOSIS — R931 Abnormal findings on diagnostic imaging of heart and coronary circulation: Secondary | ICD-10-CM | POA: Diagnosis not present

## 2023-01-23 DIAGNOSIS — E785 Hyperlipidemia, unspecified: Secondary | ICD-10-CM | POA: Diagnosis not present

## 2023-01-23 DIAGNOSIS — I480 Paroxysmal atrial fibrillation: Secondary | ICD-10-CM | POA: Diagnosis not present

## 2023-01-23 DIAGNOSIS — R0789 Other chest pain: Secondary | ICD-10-CM | POA: Diagnosis not present

## 2023-01-23 DIAGNOSIS — Z79899 Other long term (current) drug therapy: Secondary | ICD-10-CM | POA: Diagnosis not present

## 2023-01-23 DIAGNOSIS — E119 Type 2 diabetes mellitus without complications: Secondary | ICD-10-CM | POA: Diagnosis not present

## 2023-01-23 DIAGNOSIS — I2510 Atherosclerotic heart disease of native coronary artery without angina pectoris: Secondary | ICD-10-CM | POA: Diagnosis not present

## 2023-01-23 DIAGNOSIS — J986 Disorders of diaphragm: Secondary | ICD-10-CM | POA: Diagnosis not present

## 2023-01-23 NOTE — Progress Notes (Signed)
Daily Session Note  Patient Details  Name: George Wallace MRN: [id number] Date of Birth: December 19, 1960 Referring Provider:   April Manson Pulmonary Rehab Walk Test from 12/09/2022 in [facility] for Heart, Vascular, & [address]  Referring Provider [name]       Encounter Date: 01/23/2023  Check In:  Session Check In - 01/23/23 1152       Check-In   Supervising physician immediately available to respond to emergencies [facility] MD immediately available    Physician(s) Gala Romney    Location [facility]    Staff Present Janine Ores, RN, Quentin Ore, MS, ACSM-CEP, Exercise Physiologist;Samantha [place], RD, LDN;Casey Tamala Julian, RT;Randi Reeve BS, ACSM-CEP, Exercise Physiologist;Johnny Starleen Blue, MS, Exercise Physiologist;Bailey Pearline Cables, MS, Exercise Physiologist    Virtual Visit No    Medication changes reported     No    Fall or balance concerns reported    No    Tobacco Cessation No Change    Warm-up and Cool-down Performed as group-led instruction    Resistance Training Performed Yes    VAD Patient? No    PAD/SET Patient? No      Pain Assessment   Currently in Pain? No/denies    Multiple Pain Sites No             Capillary Blood Glucose: No results found for this or any previous visit (from the past 24 hour(s)).    Social History   Tobacco Use  Smoking Status Never  Smokeless Tobacco Never  Tobacco Comments   both parents smoked in home growing up.     Goals Met:  Independence with exercise equipment Exercise tolerated well No report of concerns or symptoms today Strength training completed today  Goals Unmet:  Not Applicable  Comments: Service time is from 1030 to 1145    Dr. Rodman Pickle is Medical Director for Pulmonary Rehab at [facility].

## 2023-01-28 ENCOUNTER — Encounter (HOSPITAL_COMMUNITY)
Admission: RE | Admit: 2023-01-28 | Discharge: 2023-01-28 | Disposition: A | Discharge status: Home / Self Care | Payer: BC Managed Care – PPO | Source: Ambulatory Visit | Attending: Internal Medicine | Admitting: Internal Medicine

## 2023-01-28 VITALS — Wt 266.5 lb

## 2023-01-28 DIAGNOSIS — R0602 Shortness of breath: Secondary | ICD-10-CM

## 2023-01-28 NOTE — Progress Notes (Signed)
Daily Session Note  Patient Details  Name: George Wallace MRN: [id number] Date of Birth: [birth year] Referring Provider:   April Manson Pulmonary Rehab Walk Test from 12/09/2022 in [facility] for Heart, Vascular, & [place]  Referring Provider [name]       Encounter Date: 01/28/2023  Check In:  Session Check In - 01/28/23 1124       Check-In   Supervising physician immediately available to respond to emergencies [facility] MD immediately available    Physician(s) Ambrose Pancoast, NP    Location [facility]    Staff Present Samantha [place], RD, LDN;Kadyn Chovan Yevonne Pax, ACSM-CEP, Exercise Physiologist;Carlette Wilber Oliphant, RN, Quentin Ore, MS, ACSM-CEP, Exercise Physiologist;Casey Tamala Julian, RT    Virtual Visit No    Medication changes reported     No    Fall or balance concerns reported    No    Tobacco Cessation No Change    Warm-up and Cool-down Performed as group-led instruction    Resistance Training Performed Yes    VAD Patient? No    PAD/SET Patient? No      Pain Assessment   Currently in Pain? No/denies    Multiple Pain Sites No             Capillary Blood Glucose: No results found for this or any previous visit (from the past 24 hour(s)).   Exercise Prescription Changes - 01/28/23 1500       Response to Exercise   Blood Pressure (Admit) 122/80    Blood Pressure (Exercise) 156/86    Blood Pressure (Exit) 124/70    Heart Rate (Admit) 72 bpm    Heart Rate (Exercise) 115 bpm    Heart Rate (Exit) 95 bpm    Oxygen Saturation (Admit) 100 %    Oxygen Saturation (Exercise) 94 %    Oxygen Saturation (Exit) 99 %    Rating of Perceived Exertion (Exercise) 11    Perceived Dyspnea (Exercise) 2    Duration Continue with 30 min of aerobic exercise without signs/symptoms of physical distress.    Intensity THRR unchanged      Resistance Training   Training Prescription Yes    Weight black bands    Reps 10-15    Time 10 Minutes       Treadmill   MPH 2.5    Grade 3.5    Minutes 15    METs 4.12      NuStep   Level 3    SPM 70    Minutes 15    METs 2             Social History   Tobacco Use  Smoking Status Never  Smokeless Tobacco Never  Tobacco Comments   both parents smoked in home growing up.     Goals Met:  Independence with exercise equipment Improved SOB with ADL's Exercise tolerated well No report of concerns or symptoms today Strength training completed today  Goals Unmet:  Not Applicable  Comments: Service time is from 1022 to 1142.    Dr. Rodman Pickle is Medical Director for Pulmonary Rehab at [facility].

## 2023-01-30 ENCOUNTER — Encounter (HOSPITAL_COMMUNITY)
Admission: RE | Admit: 2023-01-30 | Discharge: 2023-01-30 | Disposition: A | Discharge status: Home / Self Care | Payer: BC Managed Care – PPO | Source: Ambulatory Visit | Attending: Internal Medicine | Admitting: Internal Medicine

## 2023-01-30 DIAGNOSIS — R0602 Shortness of breath: Secondary | ICD-10-CM

## 2023-01-30 NOTE — Progress Notes (Signed)
Daily Session Note  Patient Details  Name: George Wallace MRN: [id number] Date of Birth: 04-06-1961 Referring Provider:   April Manson Pulmonary Rehab Walk Test from 12/09/2022 in [facility] for Heart, Vascular, & [place]  Referring Provider [name]       Encounter Date: 01/30/2023  Check In:  Session Check In - 01/30/23 1114       Check-In   Supervising physician immediately available to respond to emergencies [facility] MD immediately available    Physician(s) Odie Sera, PA    Location [facility]    Staff Present Samantha [place], RD, Perlie Mayo, RN, BSN;Randi Reeve BS, ACSM-CEP, Exercise Physiologist;Jetta Gilford Rile BS, ACSM-CEP, Exercise Physiologist;Kaylee Rosana Hoes, MS, ACSM-CEP, Exercise Physiologist;Gibson Telleria Tamala Julian, RT    Virtual Visit No    Medication changes reported     No    Fall or balance concerns reported    No    Tobacco Cessation No Change    Warm-up and Cool-down Performed as group-led instruction    Resistance Training Performed Yes    VAD Patient? No    PAD/SET Patient? No      Pain Assessment   Currently in Pain? No/denies    Multiple Pain Sites No             Capillary Blood Glucose: No results found for this or any previous visit (from the past 24 hour(s)).    Social History   Tobacco Use  Smoking Status Never  Smokeless Tobacco Never  Tobacco Comments   both parents smoked in home growing up.     Goals Met:  Proper associated with RPD/PD & O2 Sat Independence with exercise equipment Exercise tolerated well No report of concerns or symptoms today Strength training completed today  Goals Unmet:  Not Applicable  Comments: Service time is from 1020 to 1140.    Dr. Rodman Pickle is Medical Director for Pulmonary Rehab at [facility].

## 2023-02-04 ENCOUNTER — Encounter (HOSPITAL_COMMUNITY)
Admission: RE | Admit: 2023-02-04 | Discharge: 2023-02-04 | Disposition: A | Discharge status: Home / Self Care | Payer: BC Managed Care – PPO | Source: Ambulatory Visit | Attending: Internal Medicine | Admitting: Internal Medicine

## 2023-02-04 DIAGNOSIS — R0602 Shortness of breath: Secondary | ICD-10-CM

## 2023-02-04 NOTE — Progress Notes (Signed)
Daily Session Note  Patient Details  Name: George Wallace MRN: [id number] Date of Birth: 03-11-1961 Referring Provider:   Doristine Devoid Pulmonary Rehab Walk Test from 12/09/2022 in [facility] for Heart, Vascular, & [facility]  Referring Provider [name]       Encounter Date: 02/04/2023  Check In:  Session Check In - 02/04/23 1203       Check-In   Supervising physician immediately available to respond to emergencies [facility] MD immediately available    Physician(s) Oris Drone, PA    Location [facility]    Staff Present Samantha Belarus, RD, Dutch Gray, RN, BSN;Randi Reeve BS, ACSM-CEP, Exercise Physiologist;Kaylee Earlene Plater, MS, ACSM-CEP, Exercise Physiologist;Vonnie Ligman Katrinka Blazing, Pennelope Bracken, RN, BSN    Virtual Visit No    Medication changes reported     No    Fall or balance concerns reported    No    Tobacco Cessation No Change    Warm-up and Cool-down Performed as group-led instruction    Resistance Training Performed Yes    VAD Patient? No    PAD/SET Patient? No      Pain Assessment   Currently in Pain? No/denies    Multiple Pain Sites No             Capillary Blood Glucose: No results found for this or any previous visit (from the past 24 hour(s)).    Social History   Tobacco Use  Smoking Status Never  Smokeless Tobacco Never  Tobacco Comments   both parents smoked in home growing up.     Goals Met:  Proper associated with RPD/PD & O2 Sat Independence with exercise equipment Exercise tolerated well No report of concerns or symptoms today Strength training completed today  Goals Unmet:  Not Applicable  Comments: Service time is from 1022 to 1137.    Dr. Mechele Collin is Medical Director for Pulmonary Rehab at [facility].

## 2023-02-05 NOTE — Progress Notes (Signed)
Pulmonary Individual Treatment Plan  Patient Details  Name: George Wallace MRN: [id number] Date of Birth: [birth year] Referring Provider:   April Manson Pulmonary Rehab Walk Test from 12/09/2022 in [facility] for Heart, Vascular, & [place]  Referring Provider [name]       Initial Encounter Date:  Flowsheet Row Pulmonary Rehab Walk Test from 12/09/2022 in [facility] for Heart, Vascular, & [place]  Date 12/09/22       Visit Diagnosis: Shortness of breath  Patient's Home Medications on Admission:   Current Outpatient Medications:    Accu-Chek Softclix Lancets lancets, Use to check blood sugar daily, Disp: 100 each, Rfl: 1   acetaminophen (TYLENOL) 500 MG tablet, Take 1,000 mg by mouth every 6 (six) hours as needed for moderate pain., Disp: , Rfl:    albuterol (VENTOLIN HFA) 108 (90 Base) MCG/ACT inhaler, Inhale 2 puffs into the lungs every 4 (four) hours as needed for wheezing., Disp: 6.7 g, Rfl: 2   aspirin EC 325 MG tablet, Take 1 tablet (325 mg total) by mouth daily. (Patient taking differently: Take 325 mg by mouth as needed.), Disp: , Rfl:    Blood Glucose Monitoring Suppl (ACCU-CHEK GUIDE) w/Device KIT, Use to check blood sugar daily, Disp: 1 kit, Rfl: 0   furosemide (LASIX) 40 MG tablet, Take 1 tablet (40 mg total) by mouth daily., Disp: 90 tablet, Rfl: 2   glucose blood (ACCU-CHEK GUIDE) test strip, Use to check blood sugar daily, Disp: 100 strip, Rfl: 1   ibuprofen (ADVIL) 200 MG tablet, Take 600 mg by mouth as needed., Disp: , Rfl:    potassium chloride SA (KLOR-CON M) 20 MEQ tablet, Take 1 tablet (20 mEq total) by mouth daily., Disp: 90 tablet, Rfl: 2   rosuvastatin (CRESTOR) 10 MG tablet, Take 10 mg by mouth every evening., Disp: , Rfl:    Spacer/Aero-Holding Chambers (AEROCHAMBER PLUS FLO-VU LARGE) MISC, 1 each by Other route once., Disp: 1 each, Rfl: 0  Past Medical History: Past Medical History:  Diagnosis Date    Diabetes ([place])    Diet controlled   Hyperlipidemia     Tobacco Use: Social History   Tobacco Use  Smoking Status Never  Smokeless Tobacco Never  Tobacco Comments   both parents smoked in home growing up.     Labs: Review Flowsheet  More data may exist      Latest Ref Rng & Units 02/15/2015 10/03/2022 10/07/2022 10/18/2022 12/30/2022  Labs for ITP Cardiac and Pulmonary Rehab  Hemoglobin A1c 4.8 - 5.6 % - 6.9  - - 6.2   PH, Arterial 7.35 - 7.45 - - 7.277  7.284  7.295  7.277  7.199  7.46  -  PCO2 arterial 32 - 48 mmHg - - 44.1  43.0  42.4  42.8  56.8  41  -  Bicarbonate 20.0 - 28.0 mmol/L - - 20.9  20.4  20.6  19.9  22.1  29.2  -  TCO2 22 - 32 mmol/L 22 - 32 mmol/L 23  - '22  23  22  22  21  24  '$ - -  Acid-base deficit 0.0 - 2.0 mmol/L - - 6.0  6.0  6.0  7.0  6.0  - -  O2 Saturation % - - 99  100  100  100  93  95.6  -    Capillary Blood Glucose: Lab Results  Component Value Date   GLUCAP 111 (H) 12/19/2022   GLUCAP 106 (  H) 12/17/2022   GLUCAP 102 (H) 12/09/2022   GLUCAP 115 (H) 10/22/2022   GLUCAP 106 (H) 10/22/2022    POCT Glucose     Row Name 12/19/22 1018             POCT Blood Glucose   Pre-Exercise 111 mg/dL                Pulmonary Assessment Scores:  Pulmonary Assessment Scores     Row Name 12/09/22 0934         ADL UCSD   ADL Phase Entry     SOB Score total 4       CAT Score   CAT Score 13       mMRC Score   mMRC Score 1             UCSD: Self-administered rating of dyspnea associated with activities of daily living (ADLs) 6-point scale (0 = "not at all" to 5 = "maximal or unable to do because of breathlessness")  Scoring Scores range from 0 to 120.  Minimally important difference is 5 units  CAT: CAT can identify the health impairment of COPD patients and is better correlated with disease progression.  CAT has a scoring range of zero to 40. The CAT score is classified into four groups of low (less than 10), medium (10 - 20),  high (21-30) and very high (31-40) based on the impact level of disease on health status. A CAT score over 10 suggests significant symptoms.  A worsening CAT score could be explained by an exacerbation, poor medication adherence, poor inhaler technique, or progression of COPD or comorbid conditions.  CAT MCID is 2 points  mMRC: mMRC (Modified Medical Research Council) Dyspnea Scale is used to assess the degree of baseline functional disability in patients of respiratory disease due to dyspnea. No minimal important difference is established. A decrease in score of 1 point or greater is considered a positive change.   Pulmonary Function Assessment:  Pulmonary Function Assessment - 12/09/22 0934       Breath   Shortness of Breath Yes;Panic with Shortness of Breath;Limiting activity             Exercise Target Goals: Exercise Program Goal: Individual exercise prescription set using results from initial 6 min walk test and THRR while considering  patient's activity barriers and safety.   Exercise Prescription Goal: Initial exercise prescription builds to 30-45 minutes a day of aerobic activity, 2-3 days per week.  Home exercise guidelines will be given to patient during program as part of exercise prescription that the participant will acknowledge.  Activity Barriers & Risk Stratification:  Activity Barriers & Cardiac Risk Stratification - 12/09/22 0935       Activity Barriers & Cardiac Risk Stratification   Activity Barriers Deconditioning;Muscular Weakness;Shortness of Breath;Assistive Device   paralyzed diaphragm            6 Minute Walk:  6 Minute Walk     Row Name 12/09/22 1035         6 Minute Walk   Phase Initial     Distance 960 feet     Walk Time 6 minutes     # of Rest Breaks 0     MPH 1.82     METS 2.07     RPE 7     Perceived Dyspnea  0.5     VO2 Peak 7.23     Symptoms No     Resting HR 64  bpm     Resting BP 108/68     Resting Oxygen Saturation  100  %     Exercise Oxygen Saturation  during 6 min walk 92 %     Max Ex. HR 96 bpm     Max Ex. BP 122/66     2 Minute Post BP 120/62       Interval HR   1 Minute HR 69     2 Minute HR 80     3 Minute HR 80     4 Minute HR 96     5 Minute HR 88     6 Minute HR 93     2 Minute Post HR 62     Interval Heart Rate? Yes       Interval Oxygen   Interval Oxygen? Yes     Baseline Oxygen Saturation % 100 %     1 Minute Oxygen Saturation % 94 %     1 Minute Liters of Oxygen 0 L     2 Minute Oxygen Saturation % 94 %     2 Minute Liters of Oxygen 0 L     3 Minute Oxygen Saturation % 99 %     3 Minute Liters of Oxygen 0 L     4 Minute Oxygen Saturation % 94 %     4 Minute Liters of Oxygen 0 L     5 Minute Oxygen Saturation % 92 %     5 Minute Liters of Oxygen 0 L     6 Minute Oxygen Saturation % 94 %     6 Minute Liters of Oxygen 0 L     2 Minute Post Oxygen Saturation % 99 %     2 Minute Post Liters of Oxygen 0 L              Oxygen Initial Assessment:  Oxygen Initial Assessment - 12/10/22 1653       Home Oxygen   Home Oxygen Device None    Sleep Oxygen Prescription BiPAP    Home Exercise Oxygen Prescription None    Home Resting Oxygen Prescription None      Initial 6 min Walk   Oxygen Used None      Program Oxygen Prescription   Program Oxygen Prescription None      Intervention   Short Term Goals To learn and understand importance of maintaining oxygen saturations>88%;To learn and understand importance of monitoring SPO2 with pulse oximeter and demonstrate accurate use of the pulse oximeter.;To learn and demonstrate proper pursed lip breathing techniques or other breathing techniques.     Long  Term Goals Verbalizes importance of monitoring SPO2 with pulse oximeter and return demonstration;Maintenance of O2 saturations>88%;Exhibits proper breathing techniques, such as pursed lip breathing or other method taught during program session;Compliance with respiratory  medication;Demonstrates proper use of MDI's             Oxygen Re-Evaluation:  Oxygen Re-Evaluation     Row Name 12/10/22 1653 01/02/23 1650 01/27/23 0954         Program Oxygen Prescription   Program Oxygen Prescription -- None None       Home Oxygen   Home Oxygen Device -- None None     Sleep Oxygen Prescription -- BiPAP BiPAP     Home Exercise Oxygen Prescription -- None None     Home Resting Oxygen Prescription -- None None       Goals/Expected Outcomes   Short Term  Goals -- To learn and understand importance of maintaining oxygen saturations>88%;To learn and understand importance of monitoring SPO2 with pulse oximeter and demonstrate accurate use of the pulse oximeter.;To learn and demonstrate proper pursed lip breathing techniques or other breathing techniques.  To learn and understand importance of maintaining oxygen saturations>88%;To learn and understand importance of monitoring SPO2 with pulse oximeter and demonstrate accurate use of the pulse oximeter.;To learn and demonstrate proper pursed lip breathing techniques or other breathing techniques.      Long  Term Goals -- Verbalizes importance of monitoring SPO2 with pulse oximeter and return demonstration;Maintenance of O2 saturations>88%;Exhibits proper breathing techniques, such as pursed lip breathing or other method taught during program session;Compliance with respiratory medication;Demonstrates proper use of MDI's Verbalizes importance of monitoring SPO2 with pulse oximeter and return demonstration;Maintenance of O2 saturations>88%;Exhibits proper breathing techniques, such as pursed lip breathing or other method taught during program session;Compliance with respiratory medication;Demonstrates proper use of MDI's     Goals/Expected Outcomes Compliance and understanding of oxygen saturation monitoring and breathing techniques to decrease shortness of breath. Compliance and understanding of oxygen saturation monitoring and  breathing techniques to decrease shortness of breath. Compliance and understanding of oxygen saturation monitoring and breathing techniques to decrease shortness of breath.              Oxygen Discharge (Final Oxygen Re-Evaluation):  Oxygen Re-Evaluation - 01/27/23 0954       Program Oxygen Prescription   Program Oxygen Prescription None      Home Oxygen   Home Oxygen Device None    Sleep Oxygen Prescription BiPAP    Home Exercise Oxygen Prescription None    Home Resting Oxygen Prescription None      Goals/Expected Outcomes   Short Term Goals To learn and understand importance of maintaining oxygen saturations>88%;To learn and understand importance of monitoring SPO2 with pulse oximeter and demonstrate accurate use of the pulse oximeter.;To learn and demonstrate proper pursed lip breathing techniques or other breathing techniques.     Long  Term Goals Verbalizes importance of monitoring SPO2 with pulse oximeter and return demonstration;Maintenance of O2 saturations>88%;Exhibits proper breathing techniques, such as pursed lip breathing or other method taught during program session;Compliance with respiratory medication;Demonstrates proper use of MDI's    Goals/Expected Outcomes Compliance and understanding of oxygen saturation monitoring and breathing techniques to decrease shortness of breath.             Initial Exercise Prescription:  Initial Exercise Prescription - 12/09/22 1000       Date of Initial Exercise RX and Referring Provider   Date 12/09/22    Referring Provider [name]    Expected Discharge Date 03/06/23      Recumbant Elliptical   Level 1    RPM 20    Minutes 15      Track   Minutes 15    METs 2.07      Prescription Details   Frequency (times per week) 2    Duration Progress to 30 minutes of continuous aerobic without signs/symptoms of physical distress      Intensity   THRR 40-80% of Max Heartrate 81-129    Ratings of Perceived Exertion 11-13     Perceived Dyspnea 0-4      Progression   Progression Continue to progress workloads to maintain intensity without signs/symptoms of physical distress.      Resistance Training   Training Prescription Yes    Weight blue bands    Reps 10-15  Perform Capillary Blood Glucose checks as needed.  Exercise Prescription Changes:   Exercise Prescription Changes     Row Name 12/24/22 1200 01/14/23 1200 01/28/23 1500         Response to Exercise   Blood Pressure (Admit) 112/70 118/70 122/80     Blood Pressure (Exercise) 140/70 150/84 156/86     Blood Pressure (Exit) 110/70 118/76 124/70     Heart Rate (Admit) 79 bpm 89 bpm 72 bpm     Heart Rate (Exercise) 107 bpm 112 bpm 115 bpm     Heart Rate (Exit) 85 bpm 88 bpm 95 bpm     Oxygen Saturation (Admit) 99 % 100 % 100 %     Oxygen Saturation (Exercise) 96 % 97 % 94 %     Oxygen Saturation (Exit) 99 % 97 % 99 %     Rating of Perceived Exertion (Exercise) 11 10 11      Perceived Dyspnea (Exercise) 1 1 2      Duration Continue with 30 min of aerobic exercise without signs/symptoms of physical distress. Continue with 30 min of aerobic exercise without signs/symptoms of physical distress. Continue with 30 min of aerobic exercise without signs/symptoms of physical distress.     Intensity THRR unchanged THRR unchanged THRR unchanged       Progression   Progression Continue to progress workloads to maintain intensity without signs/symptoms of physical distress. -- --       Paramedic Prescription Yes Yes Yes     Weight blue bands blue bands black bands     Reps 10-15 10-15 10-15     Time -- 15 Minutes 10 Minutes       Treadmill   MPH 2.5 2.5 2.5     Grade 1.5 3 3.5     Minutes 15 15 15      METs 3.43 3.95 4.12       NuStep   Level 2 3 3      SPM -- 70 70     Minutes 15 15 15      METs 1.6 2.2 2              Exercise Comments:   Exercise Comments     Row Name 12/17/22 1212            Exercise Comments Pt completed 1st day of exercise. He exercised for 15 min on the track and Nustep. George Wallace averaged 2.54 METs on the track and 1.7 METs at level 2 on the Nustep. He performed the warmup and cooldown standing without limitations. Discussed METs.                Exercise Goals and Review:   Exercise Goals     Row Name 12/09/22 0935 12/10/22 1651 01/02/23 1647 01/27/23 0926       Exercise Goals   Increase Physical Activity Yes Yes Yes Yes    Intervention Provide advice, education, support and counseling about physical activity/exercise needs.;Develop an individualized exercise prescription for aerobic and resistive training based on initial evaluation findings, risk stratification, comorbidities and participant's personal goals. Provide advice, education, support and counseling about physical activity/exercise needs.;Develop an individualized exercise prescription for aerobic and resistive training based on initial evaluation findings, risk stratification, comorbidities and participant's personal goals. Provide advice, education, support and counseling about physical activity/exercise needs.;Develop an individualized exercise prescription for aerobic and resistive training based on initial evaluation findings, risk stratification, comorbidities and participant's personal goals. Provide advice, education, support and  counseling about physical activity/exercise needs.;Develop an individualized exercise prescription for aerobic and resistive training based on initial evaluation findings, risk stratification, comorbidities and participant's personal goals.    Expected Outcomes Short Term: Attend rehab on a regular basis to increase amount of physical activity.;Long Term: Add in home exercise to make exercise part of routine and to increase amount of physical activity.;Long Term: Exercising regularly at least 3-5 days a week. Short Term: Attend rehab on a regular basis to increase amount  of physical activity.;Long Term: Add in home exercise to make exercise part of routine and to increase amount of physical activity.;Long Term: Exercising regularly at least 3-5 days a week. Short Term: Attend rehab on a regular basis to increase amount of physical activity.;Long Term: Add in home exercise to make exercise part of routine and to increase amount of physical activity.;Long Term: Exercising regularly at least 3-5 days a week. Short Term: Attend rehab on a regular basis to increase amount of physical activity.;Long Term: Add in home exercise to make exercise part of routine and to increase amount of physical activity.;Long Term: Exercising regularly at least 3-5 days a week.    Increase Strength and Stamina Yes Yes Yes Yes    Intervention Provide advice, education, support and counseling about physical activity/exercise needs.;Develop an individualized exercise prescription for aerobic and resistive training based on initial evaluation findings, risk stratification, comorbidities and participant's personal goals. Provide advice, education, support and counseling about physical activity/exercise needs.;Develop an individualized exercise prescription for aerobic and resistive training based on initial evaluation findings, risk stratification, comorbidities and participant's personal goals. Provide advice, education, support and counseling about physical activity/exercise needs.;Develop an individualized exercise prescription for aerobic and resistive training based on initial evaluation findings, risk stratification, comorbidities and participant's personal goals. Provide advice, education, support and counseling about physical activity/exercise needs.;Develop an individualized exercise prescription for aerobic and resistive training based on initial evaluation findings, risk stratification, comorbidities and participant's personal goals.    Expected Outcomes Short Term: Increase workloads from initial  exercise prescription for resistance, speed, and METs.;Short Term: Perform resistance training exercises routinely during rehab and add in resistance training at home;Long Term: Improve cardiorespiratory fitness, muscular endurance and strength as measured by increased METs and functional capacity ( ) Short Term: Increase workloads from initial exercise prescription for resistance, speed, and METs.;Short Term: Perform resistance training exercises routinely during rehab and add in resistance training at home;Long Term: Improve cardiorespiratory fitness, muscular endurance and strength as measured by increased METs and functional capacity ( ) Short Term: Increase workloads from initial exercise prescription for resistance, speed, and METs.;Short Term: Perform resistance training exercises routinely during rehab and add in resistance training at home;Long Term: Improve cardiorespiratory fitness, muscular endurance and strength as measured by increased METs and functional capacity ( ) Short Term: Increase workloads from initial exercise prescription for resistance, speed, and METs.;Short Term: Perform resistance training exercises routinely during rehab and add in resistance training at home;Long Term: Improve cardiorespiratory fitness, muscular endurance and strength as measured by increased METs and functional capacity ( )    Able to understand and use rate of perceived exertion (RPE) scale Yes Yes Yes Yes    Intervention Provide education and explanation on how to use RPE scale Provide education and explanation on how to use RPE scale Provide education and explanation on how to use RPE scale Provide education and explanation on how to use RPE scale    Expected Outcomes Short Term: Able to use RPE daily in rehab to express subjective  intensity level;Long Term:  Able to use RPE to guide intensity level when exercising independently Short Term: Able to use RPE daily in rehab to express subjective  intensity level;Long Term:  Able to use RPE to guide intensity level when exercising independently Short Term: Able to use RPE daily in rehab to express subjective intensity level;Long Term:  Able to use RPE to guide intensity level when exercising independently Short Term: Able to use RPE daily in rehab to express subjective intensity level;Long Term:  Able to use RPE to guide intensity level when exercising independently    Able to understand and use Dyspnea scale Yes Yes Yes Yes    Intervention Provide education and explanation on how to use Dyspnea scale Provide education and explanation on how to use Dyspnea scale Provide education and explanation on how to use Dyspnea scale Provide education and explanation on how to use Dyspnea scale    Expected Outcomes Short Term: Able to use Dyspnea scale daily in rehab to express subjective sense of shortness of breath during exertion;Long Term: Able to use Dyspnea scale to guide intensity level when exercising independently Short Term: Able to use Dyspnea scale daily in rehab to express subjective sense of shortness of breath during exertion;Long Term: Able to use Dyspnea scale to guide intensity level when exercising independently Short Term: Able to use Dyspnea scale daily in rehab to express subjective sense of shortness of breath during exertion;Long Term: Able to use Dyspnea scale to guide intensity level when exercising independently Short Term: Able to use Dyspnea scale daily in rehab to express subjective sense of shortness of breath during exertion;Long Term: Able to use Dyspnea scale to guide intensity level when exercising independently    Knowledge and understanding of Target Heart Rate Range (THRR) Yes Yes Yes Yes    Intervention Provide education and explanation of THRR including how the numbers were predicted and where they are located for reference Provide education and explanation of THRR including how the numbers were predicted and where they are  located for reference Provide education and explanation of THRR including how the numbers were predicted and where they are located for reference Provide education and explanation of THRR including how the numbers were predicted and where they are located for reference    Expected Outcomes Short Term: Able to state/look up THRR;Long Term: Able to use THRR to govern intensity when exercising independently;Short Term: Able to use daily as guideline for intensity in rehab Short Term: Able to state/look up THRR;Long Term: Able to use THRR to govern intensity when exercising independently;Short Term: Able to use daily as guideline for intensity in rehab Short Term: Able to state/look up THRR;Long Term: Able to use THRR to govern intensity when exercising independently;Short Term: Able to use daily as guideline for intensity in rehab Short Term: Able to state/look up THRR;Long Term: Able to use THRR to govern intensity when exercising independently;Short Term: Able to use daily as guideline for intensity in rehab    Understanding of Exercise Prescription Yes Yes Yes Yes    Intervention Provide education, explanation, and written materials on patient's individual exercise prescription Provide education, explanation, and written materials on patient's individual exercise prescription Provide education, explanation, and written materials on patient's individual exercise prescription Provide education, explanation, and written materials on patient's individual exercise prescription    Expected Outcomes Short Term: Able to explain program exercise prescription;Long Term: Able to explain home exercise prescription to exercise independently Short Term: Able to explain program exercise prescription;Long  Term: Able to explain home exercise prescription to exercise independently Short Term: Able to explain program exercise prescription;Long Term: Able to explain home exercise prescription to exercise independently Short Term:  Able to explain program exercise prescription;Long Term: Able to explain home exercise prescription to exercise independently             Exercise Goals Re-Evaluation :  Exercise Goals Re-Evaluation     Row Name 12/10/22 1651 01/02/23 1648 01/27/23 0926         Exercise Goal Re-Evaluation   Exercise Goals Review Increase Physical Activity;Able to understand and use Dyspnea scale;Understanding of Exercise Prescription;Increase Strength and Stamina;Knowledge and understanding of Target Heart Rate Range (THRR);Able to understand and use rate of perceived exertion (RPE) scale Increase Physical Activity;Able to understand and use Dyspnea scale;Understanding of Exercise Prescription;Increase Strength and Stamina;Knowledge and understanding of Target Heart Rate Range (THRR);Able to understand and use rate of perceived exertion (RPE) scale Increase Physical Activity;Able to understand and use Dyspnea scale;Understanding of Exercise Prescription;Increase Strength and Stamina;Knowledge and understanding of Target Heart Rate Range (THRR);Able to understand and use rate of perceived exertion (RPE) scale     Comments George Wallace is scheduled to begin exercise this week. Will continue to monitor and progress as able. George Wallace has completed 6 exercise sessions. He exercises for 15 min on the treadmill and Nustep. He averages 3.24 METs on the treadmill and 1.8 METs at level 3 on the Nustep. George Wallace performs the warmup and cooldown standing without limitations. George Wallace has been progressed from track walking to the treadmill. He tolerates the treadmill well. He has increased his workload for both exercise modes as vital sign have maintained. George Wallace seems very motivated to exercise. I believe he enjoys coming to rehab and conversing with other patients. Will continue to monitor and progress as able. George Wallace has completed 11 exercise sessions. He exercises for 15 min on the treadmill and Nustep. He averages 4.0 METs on the  treadmill and 2.3 METs at level 3 on the Nustep. George Wallace performs the warmup and cooldown standing without limitations. George Wallace has increased his workload for the treadmill. He tolerates this progression well. He has not increased his workload for the Nustep yet, although I have encouraged him to increase his METs. Will increase workload on the Nustep. Will continue to monitor and progress as able.     Expected Outcomes Through exercise at rehab and home, the patient will decrease shortness of breath with daily activities and feel confident in carrying out an exercise regimen at home. Through exercise at rehab and home, the patient will decrease shortness of breath with daily activities and feel confident in carrying out an exercise regimen at home. Through exercise at rehab and home, the patient will decrease shortness of breath with daily activities and feel confident in carrying out an exercise regimen at home.              Discharge Exercise Prescription (Final Exercise Prescription Changes):  Exercise Prescription Changes - 01/28/23 1500       Response to Exercise   Blood Pressure (Admit) 122/80    Blood Pressure (Exercise) 156/86    Blood Pressure (Exit) 124/70    Heart Rate (Admit) 72 bpm    Heart Rate (Exercise) 115 bpm    Heart Rate (Exit) 95 bpm    Oxygen Saturation (Admit) 100 %    Oxygen Saturation (Exercise) 94 %    Oxygen Saturation (Exit) 99 %    Rating of Perceived Exertion (Exercise) 11  Perceived Dyspnea (Exercise) 2    Duration Continue with 30 min of aerobic exercise without signs/symptoms of physical distress.    Intensity THRR unchanged      Resistance Training   Training Prescription Yes    Weight black bands    Reps 10-15    Time 10 Minutes      Treadmill   MPH 2.5    Grade 3.5    Minutes 15    METs 4.12      NuStep   Level 3    SPM 70    Minutes 15    METs 2             Nutrition:  Target Goals: Understanding of nutrition guidelines,  daily intake of sodium 1500mg , cholesterol 200mg , calories 30% from fat and 7% or less from saturated fats, daily to have 5 or more servings of fruits and vegetables.  Biometrics:  Pre Biometrics - 12/09/22 0927       Pre Biometrics   Grip Strength 31 kg              Nutrition Therapy Plan and Nutrition Goals:  Nutrition Therapy & Goals - 01/14/23 1235       Nutrition Therapy   Diet Heart Healthy/Carbohydrate Consistent diet    Drug/Food Interactions Statins/Certain Fruits      Personal Nutrition Goals   Nutrition Goal Patient to improve diet quality by using the plate method as a daily guide for meal planning to include lean protein/plant protein, fruits, vegetables, whole grains, nonfat dairy as part of well balanced diet    Personal Goal #2 Patient to identify food sources and limit daily intake of saturated fat, sodium, refined carbohydrates    Personal Goal #3 Patient to reduce sodium to <2000 mg per day    Comments Goals in progress. 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 He will need his BiPAP for life to prevent CO2 narcosis. He continues to struggle with consistent, restful sleep. He does enjoy cooking; reviewed reading food labels for sodium and salt free seasoning blends. He is being more consistent with medications. A1c improved. George Wallace will benefit from participation in pulmonary rehab for nutrition, exercise, and lifestyle modfication.      Intervention Plan   Intervention Prescribe, educate and counsel regarding individualized specific dietary modifications aiming towards targeted core components such as weight, hypertension, lipid management, diabetes, heart failure and other comorbidities.;Nutrition handout(s) given to patient.    Expected Outcomes Short Term Goal: Understand basic principles of dietary content, such as calories, fat, sodium, cholesterol and nutrients.;Long Term Goal: Adherence to  prescribed nutrition plan.             Nutrition Assessments:  MEDIFICTS Score Key: ?70 Need to make dietary changes  40-70 Heart Healthy Diet ? 40 Therapeutic Level Cholesterol Diet   Picture Your Plate Scores: <16 Unhealthy dietary pattern with much room for improvement. 41-50 Dietary pattern unlikely to meet recommendations for good health and room for improvement. 51-60 More healthful dietary pattern, with some room for improvement.  >60 Healthy dietary pattern, although there may be some specific behaviors that could be improved.    Nutrition Goals Re-Evaluation:  Nutrition Goals Re-Evaluation     Row Name 12/17/22 1132 01/14/23 1235           Goals   Current Weight -- 265 lb 3.4 oz (120.3 kg)      Comment A1c 6.9, cholesterol 222,  LDL 152 A1c 6.2, he is down 6# since starting with our program.      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 He will need his BiPAP for life to prevent CO2 narcosis. He does report poor sleep due to sleeping in recliner with ~2 hours/night. He does enjoy cooking; discussed reading food labels for sodium and salt free seasoning blends. George Wallace will benefit from participation in pulmonary rehab for nutrition, exercise, and lifestyle modfication. Goals in progress. 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 He will need his BiPAP for life to prevent CO2 narcosis. He continues to struggle with consistent, restful sleep. He does enjoy cooking; reviewed reading food labels for sodium and salt free seasoning blends. He is being more consistent with medications. A1c improved. George Wallace will benefit from participation in pulmonary rehab for nutrition, exercise, and lifestyle modfication.               Nutrition Goals Discharge (Final Nutrition Goals Re-Evaluation):  Nutrition Goals Re-Evaluation - 01/14/23 1235        Goals   Current Weight 265 lb 3.4 oz (120.3 kg)    Comment A1c 6.2, he is down 6# since starting with our program.    Expected Outcome Goals in progress. 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 He will need his BiPAP for life to prevent CO2 narcosis. He continues to struggle with consistent, restful sleep. He does enjoy cooking; reviewed reading food labels for sodium and salt free seasoning blends. He is being more consistent with medications. A1c improved. George Wallace will benefit from participation in pulmonary rehab for nutrition, exercise, and lifestyle modfication.             Psychosocial: Target Goals: Acknowledge presence or absence of significant depression and/or stress, maximize coping skills, provide positive support system. Participant is able to verbalize types and ability to use techniques and skills needed for reducing stress and depression.  Initial Review & Psychosocial Screening:  Initial Psych Review & Screening - 12/09/22 0937       Initial Review   Current issues with Current Sleep Concerns    Comments Pt has hard time going to sleep      Family Dynamics   Good Support System? Yes    Comments wife and friends      Barriers   Psychosocial barriers to participate in program The patient should benefit from training in stress management and relaxation.      Screening Interventions   Interventions Encouraged to exercise;To provide support and resources with identified psychosocial needs    Expected Outcomes Long Term Goal: Stressors or current issues are controlled or eliminated.             Quality of Life Scores:  Scores of 19 and below usually indicate a poorer quality of life in these areas.  A difference of  2-3 points is a clinically meaningful difference.  A difference of 2-3 points in the total score of the Quality of Life Index has been associated with significant improvement in overall  quality of life, self-image, physical symptoms, and general health in studies assessing change in quality of life.  PHQ-9: Review Flowsheet       12/09/2022  Depression screen PHQ 2/9  Decreased Interest 0  Down, Depressed, Hopeless 0  PHQ - 2 Score 0  Altered sleeping 0  Tired, decreased energy 0  Change in appetite 0  Feeling bad or failure about yourself  0  Trouble concentrating 0  Moving slowly or fidgety/restless 0  Suicidal thoughts 0  PHQ-9 Score 0  Difficult doing work/chores Not difficult at all   Interpretation of Total Score  Total Score Depression Severity:  1-4 = Minimal depression, 5-9 = Mild depression, 10-14 = Moderate depression, 15-19 = Moderately severe depression, 20-27 = Severe depression   Psychosocial Evaluation and Intervention:  Psychosocial Evaluation - 01/01/23 1131       Psychosocial Evaluation & Interventions   Interventions Stress management education;Relaxation education;Encouraged to exercise with the program and follow exercise prescription    Comments Pt denies anxiety or depression. He admits he has a hard time sleeping.    Expected Outcomes For Erle to participate in PR free of psychosocial barriers    Continue Psychosocial Services  Follow up required by staff             Psychosocial Re-Evaluation:  Psychosocial Re-Evaluation     Row Name 12/09/22 1251 01/01/23 1132 01/24/23 1006         Psychosocial Re-Evaluation   Current issues with Current Sleep Concerns None Identified Current Stress Concerns     Comments George Wallace completed his orientation 2/12 and his first class is scheduled for 2/20. George Wallace has attended  5 PR classes so far. Dhyey reports that he is sleeping better. George Wallace states he has stress over finances as he hasn't worked since last December. He is in the process of getting a therapist through his insurance company. He reports that he is sleeping better since exercising. He thinks the program is "great" and  enjoys coming to class.     Expected Outcomes For Christia to participate in PR and get better sleep with increased exercise. For pt to participate in PR free of psycosocial concerns For pt to participate in PR free of psycosocial concerns     Interventions Encouraged to attend Pulmonary Rehabilitation for the exercise;Relaxation education Encouraged to attend Pulmonary Rehabilitation for the exercise Encouraged to attend Pulmonary Rehabilitation for the exercise     Continue Psychosocial Services  Follow up required by staff  We will continue to monitor and assess George Wallace for sleep concerns No Follow up required Follow up required by staff  We will continue to monitor and assess for any needs       Initial Review   Source of Stress Concerns -- -- Financial;Retirement/disability     Comments -- -- Hasn't worked since last December              Psychosocial Discharge (Final Psychosocial Re-Evaluation):  Psychosocial Re-Evaluation - 01/24/23 1006       Psychosocial Re-Evaluation   Current issues with Current Stress Concerns    Comments Madyx states he has stress over finances as he hasn't worked since last December. He is in the process of getting a therapist through his insurance company. He reports that he is sleeping better since exercising. He thinks the program is "great" and enjoys coming to class.    Expected Outcomes For pt to participate in PR free of psycosocial concerns    Interventions Encouraged to attend Pulmonary Rehabilitation for the exercise    Continue Psychosocial Services  Follow up required by staff   We will continue to monitor and assess for any needs     Initial Review   Source of Stress Concerns Financial;Retirement/disability    Comments Hasn't worked since last December  Education: Education Goals: Education classes will be provided on a weekly basis, covering required topics. Participant will state understanding/return demonstration of  topics presented.  Learning Barriers/Preferences:  Learning Barriers/Preferences - 12/09/22 0939       Learning Barriers/Preferences   Learning Barriers None    Learning Preferences Pictoral;Skilled Demonstration;Video;Written Material;Computer/Internet             Education Topics: Introduction to Pulmonary Rehab Group instruction provided by PowerPoint, verbal discussion, and written material to support subject matter. Instructor reviews what Pulmonary Rehab is, the purpose of the program, and how patients are referred.     Know Your Numbers Group instruction that is supported by a PowerPoint presentation. Instructor discusses importance of knowing and understanding resting, exercise, and post-exercise oxygen saturation, heart rate, and blood pressure. Oxygen saturation, heart rate, blood pressure, rating of perceived exertion, and dyspnea are reviewed along with a normal range for these values.    Exercise for the Pulmonary Patient Group instruction that is supported by a PowerPoint presentation. Instructor discusses benefits of exercise, core components of exercise, frequency, duration, and intensity of an exercise routine, importance of utilizing pulse oximetry during exercise, safety while exercising, and options of places to exercise outside of rehab.       MET Level  Group instruction provided by PowerPoint, verbal discussion, and written material to support subject matter. Instructor reviews what METs are and how to increase METs.  Flowsheet Row PULMONARY REHAB OTHER RESPIRATORY from 12/17/2022 in [facility], V[facility], & [facility]  Date 12/17/22  Educator EP  Instruction Review Code 1- Verbalizes Understanding       Pulmonary Medications Verbally interactive group education provided by instructor with focus on inhaled medications and proper administration. Flowsheet Row PULMONARY REHAB OTHER RESPIRATORY from 01/23/2023 in [facility], V[facility], [facility]  Date 01/23/23  Educator RT  Instruction Review Code 1- Verbalizes Understanding       Anatomy and Physiology of the Respiratory System Group instruction provided by PowerPoint, verbal discussion, and written material to support subject matter. Instructor reviews respiratory cycle and anatomical components of the respiratory system and their functions. Instructor also reviews differences in obstructive and restrictive respiratory diseases with examples of each.    Oxygen Safety Group instruction provided by PowerPoint, verbal discussion, and written material to support subject matter. There is an overview of "What is Oxygen" and "Why do we need it".  Instructor also reviews how to create a safe environment for oxygen use, the importance of using oxygen as prescribed, and the risks of noncompliance. There is a brief discussion on traveling with oxygen and resources the patient may utilize.   Oxygen Use Group instruction provided by PowerPoint, verbal discussion, and written material to discuss how supplemental oxygen is prescribed and different types of oxygen supply systems. Resources for more information are provided.    Breathing Techniques Group instruction that is supported by demonstration and informational handouts. Instructor discusses the benefits of pursed lip and diaphragmatic breathing and detailed demonstration on how to perform both.     Risk Factor Reduction Group instruction that is supported by a PowerPoint presentation. Instructor discusses the definition of a risk factor, different risk factors for pulmonary disease, and how the heart and lungs work together.   MD Day A group question and answer session with a medical doctor that allows participants to ask questions that relate to their pulmonary disease state.   Nutrition for the  Pulmonary Patient Group instruction provided by PowerPoint slides,  verbal discussion, and written materials to support subject matter. The instructor gives an explanation and review of healthy diet recommendations, which includes a discussion on weight management, recommendations for fruit and vegetable consumption, as well as protein, fluid, caffeine, fiber, sodium, sugar, and alcohol. Tips for eating when patients are short of breath are discussed.    Other Education Group or individual verbal, written, or video instructions that support the educational goals of the pulmonary rehab program. Flowsheet Row PULMONARY REHAB OTHER RESPIRATORY from 12/24/2022 in [facility] for Heart, Vascular, & Lung Health  Date 12/24/22  George Wallace the Sedentary Lifestyle]  Educator EP  Instruction Review Code 1- Verbalizes Understanding  [Hand out provided]        Knowledge Questionnaire Score:  Knowledge Questionnaire Score - 12/09/22 1000       Knowledge Questionnaire Score   Pre Score 16/18             Core Components/Risk Factors/Patient Goals at Admission:  Personal Goals and Risk Factors at Admission - 12/09/22 0939       Core Components/Risk Factors/Patient Goals on Admission    Weight Management Weight Loss;Yes    Intervention Weight Management: Develop a combined nutrition and exercise program designed to reach desired caloric intake, while maintaining appropriate intake of nutrient and fiber, sodium and fats, and appropriate energy expenditure required for the weight goal.;Weight Management: Provide education and appropriate resources to help participant work on and attain dietary goals.;Weight Management/Obesity: Establish reasonable short term and long term weight goals.;Obesity: Provide education and appropriate resources to help participant work on and attain dietary goals.    Admit Weight 271 lb 9.7 oz (123.2 kg)    Expected Outcomes Short Term: Continue to assess and modify interventions until short term weight is achieved;Long  Term: Adherence to nutrition and physical activity/exercise program aimed toward attainment of established weight goal;Weight Maintenance: Understanding of the daily nutrition guidelines, which includes 25-35% calories from fat, 7% or less cal from saturated fats, less than 200mg  cholesterol, less than 1.5gm of sodium, & 5 or more servings of fruits and vegetables daily;Weight Loss: Understanding of general recommendations for a balanced deficit meal plan, which promotes 1-2 lb weight loss per week and includes a negative energy balance of [id number] kcal/d;Understanding recommendations for meals to include 15-35% energy as protein, 25-35% energy from fat, 35-60% energy from carbohydrates, less than 200mg  of dietary cholesterol, 20-35 gm of total fiber daily;Understanding of distribution of calorie intake throughout the day with the consumption of 4-5 meals/snacks    Improve shortness of breath with ADL's Yes    Intervention Provide education, individualized exercise plan and daily activity instruction to help decrease symptoms of SOB with activities of daily living.    Expected Outcomes Short Term: Improve cardiorespiratory fitness to achieve a reduction of symptoms when performing ADLs;Long Term: Be able to perform more ADLs without symptoms or delay the onset of symptoms             Core Components/Risk Factors/Patient Goals Review:   Goals and Risk Factor Review     Row Name 12/09/22 1254 01/01/23 1136 01/24/23 1104         Core Components/Risk Factors/Patient Goals Review   Personal Goals Review Weight Management/Obesity;Improve shortness of breath with ADL's;Develop more efficient breathing techniques such as purse lipped breathing and diaphragmatic breathing and practicing self-pacing with activity. Develop more efficient breathing techniques such as purse lipped breathing and diaphragmatic  breathing and practicing self-pacing with activity.;Increase knowledge of respiratory medications and  ability to use respiratory devices properly.;Improve shortness of breath with ADL's Develop more efficient breathing techniques such as purse lipped breathing and diaphragmatic breathing and practicing self-pacing with activity.;Improve shortness of breath with ADL's;Weight Management/Obesity;Increase knowledge of respiratory medications and ability to use respiratory devices properly.;Stress     Review George Wallace is scheduled to start the program on 12/17/22 George Wallace has completed 5 PR sessions so far. He is currently exercising on the treadmill and the Nustep. He has been able to increase his intensity on the treadmill. He has also been able to increase his workload and METs on the NuStep. He is practicing pursed lip breathing. He has attended the warning signs and symptoms and beating the sedentary lifestyle classes. George Wallace has not been consistent with taking his medications. Staff reinterated the importance of being compliant with his medications. We will continue to assess any progress he makes during PR classes. George Wallace has completed 12 PR sessions so far. He is currently exercising on the treadmill and the Nustep. He has been able to increase his speed and incline on the treadmill. He has also been able to increase his workload and METs on the NuStep. He is practicing pursed lip breathing when he is short of breath. He has attended the warning signs and symptoms, beating the sedentary lifestyle, and the pulmonary medications classes. Jacier has not been consistent with taking his medications. Staff reiterated the importance of being compliant with his medications. George Wallace has not lost any weight while in the program. He is working with our dietitian on healthy choices. We will continue to assess any progress he makes during PR classes.     Expected Outcomes See Admission Goals See admission goals See admission goals              Core Components/Risk Factors/Patient Goals at Discharge (Final Review):    Goals and Risk Factor Review - 01/24/23 1104       Core Components/Risk Factors/Patient Goals Review   Personal Goals Review Develop more efficient breathing techniques such as purse lipped breathing and diaphragmatic breathing and practicing self-pacing with activity.;Improve shortness of breath with ADL's;Weight Management/Obesity;Increase knowledge of respiratory medications and ability to use respiratory devices properly.;Stress    Review George Wallace has completed 12 PR sessions so far. He is currently exercising on the treadmill and the Nustep. He has been able to increase his speed and incline on the treadmill. He has also been able to increase his workload and METs on the NuStep. He is practicing pursed lip breathing when he is short of breath. He has attended the warning signs and symptoms, beating the sedentary lifestyle, and the pulmonary medications classes. Maricela has not been consistent with taking his medications. Staff reiterated the importance of being compliant with his medications. Esiquio has not lost any weight while in the program. He is working with our dietitian on healthy choices. We will continue to assess any progress he makes during PR classes.    Expected Outcomes See admission goals             ITP Comments:   Comments: Dr. Mechele Collin is Medical Director for Pulmonary Rehab at [facility].

## 2023-02-06 ENCOUNTER — Encounter (HOSPITAL_COMMUNITY)
Admission: RE | Admit: 2023-02-06 | Discharge: 2023-02-06 | Disposition: A | Discharge status: Home / Self Care | Payer: BC Managed Care – PPO | Source: Ambulatory Visit | Attending: Internal Medicine | Admitting: Internal Medicine

## 2023-02-06 DIAGNOSIS — R0602 Shortness of breath: Secondary | ICD-10-CM

## 2023-02-06 NOTE — Progress Notes (Signed)
Daily Session Note  Patient Details  Name: George Wallace MRN: [id number] Date of Birth: February 25, 1961 Referring Provider:   Doristine Devoid Pulmonary Rehab Walk Test from 12/09/2022 in [facility] for Heart, Vascular, & Lung Health  Referring Provider [name]       Encounter Date: 02/06/2023  Check In:  Session Check In - 02/06/23 1148       Check-In   Supervising physician immediately available to respond to emergencies [facility] MD immediately available    Physician(s) Carlos Levering, NP    Location [facility]    Staff Present Samantha Belarus, RD, Dutch Gray, RN, BSN;Randi Reeve BS, ACSM-CEP, Exercise Physiologist;Kaylee Earlene Plater, MS, ACSM-CEP, Exercise Physiologist;Faust Thorington Marcille Buffy, RN, BSN    Virtual Visit No    Medication changes reported     No    Fall or balance concerns reported    No    Tobacco Cessation No Change    Warm-up and Cool-down Performed as group-led instruction    Resistance Training Performed Yes    VAD Patient? No    PAD/SET Patient? No      Pain Assessment   Currently in Pain? No/denies    Multiple Pain Sites No             Capillary Blood Glucose: No results found for this or any previous visit (from the past 24 hour(s)).    Social History   Tobacco Use  Smoking Status Never  Smokeless Tobacco Never  Tobacco Comments   both parents smoked in home growing up.     Goals Met:  Proper associated with RPD/PD & O2 Sat Independence with exercise equipment Exercise tolerated well No report of concerns or symptoms today Strength training completed today  Goals Unmet:  Not Applicable  Comments: Service time is from 1025 to 1140.    Dr. Mechele Collin is Medical Director for Pulmonary Rehab at [facility].

## 2023-02-06 NOTE — Progress Notes (Addendum)
Home Exercise Prescription I have reviewed a Home Exercise Prescription with Ailene Ravel. Freddy is currently exercising inconsistently for about 1-2 non-rehab days/wk for 15 min/day. I encouraged Bomani to continue walking on the treadmill. I recommended increasing his frequency 2 non-rehab days/wk for 30 min/day. I discussed with Malikai making his home exercise more consistent. He agreed with my recommendations. Jeter is very motivated to exercise and improve his functional capacity. The patient stated that their goals were to lose weight and strengthen his lungs. We reviewed exercise guidelines, target heart rate during exercise, RPE Scale, weather conditions, endpoints for exercise, warmup and cool down. The patient is encouraged to come to me with any questions. I will continue to follow up with the patient to assist them with progression and safety. Spent 15 min with patient discussing home exercise plan and goals.  Joya San, MS, ACSM-CEP 02/06/2023 4:29 PM

## 2023-02-07 ENCOUNTER — Ambulatory Visit (INDEPENDENT_AMBULATORY_CARE_PROVIDER_SITE_OTHER): Payer: BC Managed Care – PPO | Admitting: Thoracic Surgery (Cardiothoracic Vascular Surgery)

## 2023-02-07 DIAGNOSIS — J986 Disorders of diaphragm: Secondary | ICD-10-CM | POA: Diagnosis not present

## 2023-02-07 NOTE — Progress Notes (Signed)
     [address]       [place] [address]             [phone]       Patient: Home Provider: Office Consent for Telemedicine visit obtained.  Today's visit was completed via a real-time telehealth (see specific modality noted below). The patient/authorized person provided oral consent at the time of the visit to engage in a telemedicine encounter with the present provider at [facility]. The patient/authorized person was informed of the potential benefits, limitations, and risks of telemedicine. The patient/authorized person expressed understanding that the laws that protect confidentiality also apply to telemedicine. The patient/authorized person acknowledged understanding that telemedicine does not provide emergency services and that he or she would need to call 911 or proceed to the nearest hospital for help if such a need arose.   Total time spent in the clinical discussion 10 minutes.  Telehealth Modality: Phone visit (audio only)  I had a telephone visit with George Wallace.  He states that he is improving with pulmonary rehab, but still has limitations.  He would like to complete the program prior to returning to work.  I will meet with him in May.  Noura Purpura Keane [name]

## 2023-02-11 ENCOUNTER — Encounter (HOSPITAL_COMMUNITY)
Admission: RE | Admit: 2023-02-11 | Discharge: 2023-02-11 | Disposition: A | Discharge status: Home / Self Care | Payer: BC Managed Care – PPO | Source: Ambulatory Visit | Attending: Internal Medicine | Admitting: Internal Medicine

## 2023-02-11 VITALS — Wt 266.8 lb

## 2023-02-11 DIAGNOSIS — R0602 Shortness of breath: Secondary | ICD-10-CM

## 2023-02-11 NOTE — Progress Notes (Signed)
Daily Session Note  Patient Details  Name: George Wallace MRN: [id number] Date of Birth: 1960/12/05 Referring Provider:   Doristine Devoid Pulmonary Rehab Walk Test from 12/09/2022 in [facility] for Heart, Vascular, & Lung Health  Referring Provider [name]       Encounter Date: 02/11/2023  Check In:  Session Check In - 02/11/23 1513       Check-In   Supervising physician immediately available to respond to emergencies [facility] immediately available    Physician(s) Jari Favre, PA    [facility]    Staff Present Samantha Belarus, RD, Dutch Gray, RN, BSN;Randi Reeve BS, ACSM-CEP, Exercise Physiologist;Analys Ryden Earlene Plater, MS, ACSM-CEP, Exercise Physiologist;Casey Katrinka Blazing, RT    Virtual Visit No    Medication changes reported     No    Fall or balance concerns reported    No    Tobacco Cessation No Change    Warm-up and Cool-down Performed as group-led instruction    Resistance Training Performed Yes    VAD Patient? No    PAD/SET Patient? No      Pain Assessment   Currently in Pain? No/denies    Multiple Pain Sites No             Capillary Blood Glucose: No results found for this or any previous visit (from the past 24 hour(s)).   Exercise Prescription Changes - 02/11/23 1500       Response to Exercise   Blood Pressure (Admit) 102/70    Blood Pressure (Exercise) 142/70    Blood Pressure (Exit) 98/64    Heart Rate (Admit) 74 bpm    Heart Rate (Exercise) 124 bpm    Heart Rate (Exit) 81 bpm    Oxygen Saturation (Admit) 100 %    Oxygen Saturation (Exercise) 95 %    Oxygen Saturation (Exit) 97 %    Rating of Perceived Exertion (Exercise) 13    Perceived Dyspnea (Exercise) 2    Duration Continue with 30 min of aerobic exercise without signs/symptoms of physical distress.    Intensity THRR unchanged      Progression   Progression Continue to progress workloads to maintain intensity without signs/symptoms of physical distress.       Resistance Training   Training Prescription Yes    Weight black bands    Reps 10-15    Time 10 Minutes      Treadmill   MPH 2.5    Grade 3.5    Minutes 15    METs 4.12      Elliptical   Level 2    Speed 2    Minutes 15    METs 4.2             Social History   Tobacco Use  Smoking Status Never  Smokeless Tobacco Never  Tobacco Comments   both parents smoked in home growing up.     Goals Met:  Proper associated with RPD/PD & O2 Sat Independence with exercise equipment Exercise tolerated well No report of concerns or symptoms today Strength training completed today  Goals Unmet:  Not Applicable  Comments: Service time is from 1024 to 1144.    Dr. Mechele Collin is Medical Director for Pulmonary Rehab at [facility].

## 2023-02-13 ENCOUNTER — Encounter (HOSPITAL_COMMUNITY)
Admission: RE | Admit: 2023-02-13 | Discharge: 2023-02-13 | Disposition: A | Discharge status: Home / Self Care | Payer: BC Managed Care – PPO | Source: Ambulatory Visit | Attending: Internal Medicine | Admitting: Internal Medicine

## 2023-02-13 DIAGNOSIS — R0602 Shortness of breath: Secondary | ICD-10-CM | POA: Diagnosis not present

## 2023-02-13 NOTE — Progress Notes (Signed)
Daily Session Note  Patient Details  Name: George Wallace MRN: [id number] Date of Birth: 07-15-1961 Referring Provider:   Doristine Devoid Pulmonary Rehab Walk Test from 12/09/2022 in [name] for Heart, Vascular, & Lung Health  Referring Provider [name]       Encounter Date: 02/13/2023  Check In:  Session Check In - 02/13/23 1229       Check-In   Supervising physician immediately available to respond to emergencies [facility] immediately available    Physician(s) Eligha Bridegroom NP    [facility]    Staff Present Samantha Belarus, RD, Dutch Gray, RN, BSN;Finneas Mathe BS, ACSM-CEP, Exercise Physiologist;Kaylee Earlene Plater, MS, ACSM-CEP, Exercise Physiologist;Casey Katrinka Blazing, RT    Virtual Visit No    Medication changes reported     No    Fall or balance concerns reported    No    Tobacco Cessation No Change    Warm-up and Cool-down Performed as group-led instruction    Resistance Training Performed Yes    VAD Patient? No    PAD/SET Patient? No      Pain Assessment   Currently in Pain? No/denies             Capillary Blood Glucose: No results found for this or any previous visit (from the past 24 hour(s)).    Social History   Tobacco Use  Smoking Status Never  Smokeless Tobacco Never  Tobacco Comments   both parents smoked in home growing up.     Goals Met:  Independence with exercise equipment Improved SOB with ADL's Exercise tolerated well No report of concerns or symptoms today Strength training completed today  Goals Unmet:  Not Applicable  Comments: Service time is from 1020 to 1155.    Dr. Mechele Collin is Medical Director for Pulmonary Rehab at [facility].

## 2023-02-17 DIAGNOSIS — I700 Atherosclerosis of aorta: Secondary | ICD-10-CM | POA: Diagnosis not present

## 2023-02-17 DIAGNOSIS — E119 Type 2 diabetes mellitus without complications: Secondary | ICD-10-CM | POA: Diagnosis not present

## 2023-02-17 DIAGNOSIS — J9600 Acute respiratory failure, unspecified whether with hypoxia or hypercapnia: Secondary | ICD-10-CM | POA: Diagnosis not present

## 2023-02-17 DIAGNOSIS — J986 Disorders of diaphragm: Secondary | ICD-10-CM | POA: Diagnosis not present

## 2023-02-18 ENCOUNTER — Encounter (HOSPITAL_COMMUNITY)
Admission: RE | Admit: 2023-02-18 | Discharge: 2023-02-18 | Disposition: A | Discharge status: Home / Self Care | Payer: BC Managed Care – PPO | Source: Ambulatory Visit | Attending: Internal Medicine | Admitting: Internal Medicine

## 2023-02-18 DIAGNOSIS — R0602 Shortness of breath: Secondary | ICD-10-CM | POA: Diagnosis not present

## 2023-02-18 NOTE — Progress Notes (Signed)
Daily Session Note  Patient Details  Name: George Wallace MRN: [id number] Date of Birth: 1961/03/08 Referring Provider:   Doristine Devoid Pulmonary Rehab Walk Test from 12/09/2022 in [facility] for Heart, Vascular, & Lung Health  Referring Provider [name]       Encounter Date: 02/18/2023  Check In:  Session Check In - 02/18/23 1213       Check-In   Supervising physician immediately available to respond to emergencies [facility] immediately available    Physician(s) Eligha Bridegroom, NP    [facility]    Staff Present Essie Hart, RN, BSN;Randi Idelle Crouch BS, ACSM-CEP, Exercise Physiologist;Casey Charlean Sanfilippo, MS, ACSM-CEP, Exercise Physiologist    Virtual Visit No    Medication changes reported     No    Fall or balance concerns reported    No    Tobacco Cessation No Change    Warm-up and Cool-down Performed as group-led instruction    Resistance Training Performed Yes    VAD Patient? No    PAD/SET Patient? No      Pain Assessment   Currently in Pain? No/denies    Multiple Pain Sites No             Capillary Blood Glucose: No results found for this or any previous visit (from the past 24 hour(s)).    Social History   Tobacco Use  Smoking Status Never  Smokeless Tobacco Never  Tobacco Comments   both parents smoked in home growing up.     Goals Met:  Independence with exercise equipment Improved SOB with ADL's Exercise tolerated well No report of concerns or symptoms today Strength training completed today  Goals Unmet:  Not Applicable  Comments: Service time is from 1030 to 1150    Dr. Mechele Collin is Medical Director for Pulmonary Rehab at [facility].

## 2023-02-20 ENCOUNTER — Encounter (HOSPITAL_COMMUNITY)
Admission: RE | Admit: 2023-02-20 | Discharge: 2023-02-20 | Disposition: A | Discharge status: Home / Self Care | Payer: BC Managed Care – PPO | Source: Ambulatory Visit | Attending: Internal Medicine | Admitting: Internal Medicine

## 2023-02-20 DIAGNOSIS — R0602 Shortness of breath: Secondary | ICD-10-CM

## 2023-02-20 NOTE — Progress Notes (Signed)
Daily Session Note  Patient Details  Name: George Wallace MRN: [id number] Date of Birth: 1961/10/25 Referring Provider:   Doristine Devoid Pulmonary Rehab Walk Test from 12/09/2022 in [facility] for Heart, Vascular, & Lung Health  Referring Provider [name]       Encounter Date: 02/20/2023  Check In:  Session Check In - 02/20/23 1212       Check-In   Supervising physician immediately available to respond to emergencies [facility] immediately available    Physician(s) Bernadene Person, NP    [facility]    Staff Present Essie Hart, RN, BSN;Randi Idelle Crouch BS, ACSM-CEP, Exercise Physiologist;Casey Charlean Sanfilippo, MS, ACSM-CEP, Exercise Physiologist;Samantha Belarus, RD, LDN    Virtual Visit No    Medication changes reported     No    Fall or balance concerns reported    No    Tobacco Cessation No Change    Warm-up and Cool-down Performed as group-led instruction    Resistance Training Performed Yes    VAD Patient? No    PAD/SET Patient? No      Pain Assessment   Currently in Pain? No/denies    Multiple Pain Sites No             Capillary Blood Glucose: No results found for this or any previous visit (from the past 24 hour(s)).    Social History   Tobacco Use  Smoking Status Never  Smokeless Tobacco Never  Tobacco Comments   both parents smoked in home growing up.     Goals Met:  Independence with exercise equipment Improved SOB with ADL's Exercise tolerated well No report of concerns or symptoms today Strength training completed today  Goals Unmet:  Not Applicable  Comments: service time: 1033 to 1155   Dr. Mechele Collin is Medical Director for Pulmonary Rehab at [facility].

## 2023-02-21 DIAGNOSIS — J984 Other disorders of lung: Secondary | ICD-10-CM | POA: Diagnosis not present

## 2023-02-21 DIAGNOSIS — J9610 Chronic respiratory failure, unspecified whether with hypoxia or hypercapnia: Secondary | ICD-10-CM | POA: Diagnosis not present

## 2023-02-25 ENCOUNTER — Encounter (HOSPITAL_COMMUNITY)
Admission: RE | Admit: 2023-02-25 | Discharge: 2023-02-25 | Disposition: A | Discharge status: Home / Self Care | Payer: BC Managed Care – PPO | Source: Ambulatory Visit | Attending: Internal Medicine | Admitting: Internal Medicine

## 2023-02-25 VITALS — Wt 269.8 lb

## 2023-02-25 DIAGNOSIS — R0602 Shortness of breath: Secondary | ICD-10-CM | POA: Diagnosis not present

## 2023-02-25 NOTE — Progress Notes (Signed)
Daily Session Note  Patient Details  Name: Corderro Koloski MRN: [id number] Date of Birth: 1960-10-30 Referring Provider:   Doristine Devoid Pulmonary Rehab Walk Test from 12/09/2022 in [facility] for Heart, Vascular, & Lung Health  Referring Provider [name]       Encounter Date: 02/25/2023  Check In:  Session Check In - 02/25/23 1135       Check-In   Supervising physician immediately available to respond to emergencies [facility] immediately available    Physician(s) Robin Searing, NP    Location [facility]    Staff Present Karlene Lineman, RN, BSN;Samantha Belarus, RD, Dutch Gray, RN, Doris Cheadle, MS, ACSM-CEP, Exercise Physiologist;Randi Dionisio Paschal, ACSM-CEP, Exercise Physiologist;Laticia Vannostrand Katrinka Blazing, RT    Virtual Visit No    Medication changes reported     No    Fall or balance concerns reported    No    Tobacco Cessation No Change    Warm-up and Cool-down Performed as group-led instruction    Resistance Training Performed Yes    VAD Patient? No    PAD/SET Patient? No      Pain Assessment   Currently in Pain? No/denies    Multiple Pain Sites No             Capillary Blood Glucose: No results found for this or any previous visit (from the past 24 hour(s)).   Exercise Prescription Changes - 02/25/23 1200       Response to Exercise   Blood Pressure (Admit) 118/72    Blood Pressure (Exercise) 138/64    Blood Pressure (Exit) 104/62    Heart Rate (Admit) 70 bpm    Heart Rate (Exercise) 117 bpm    Heart Rate (Exit) 81 bpm    Oxygen Saturation (Admit) 99 %    Oxygen Saturation (Exercise) 96 %    Oxygen Saturation (Exit) 97 %    Rating of Perceived Exertion (Exercise) 11    Perceived Dyspnea (Exercise) 2    Duration Continue with 30 min of aerobic exercise without signs/symptoms of physical distress.    Intensity THRR unchanged      Progression   Progression Continue to progress workloads to maintain intensity without  signs/symptoms of physical distress.      Resistance Training   Training Prescription Yes    Weight black bands    Reps 10-15    Time 10 Minutes      Treadmill   MPH 2.7    Grade 3.5    Minutes 15    METs 4.37      Elliptical   Level 2    Speed 3    Minutes 15    METs 4.2             Social History   Tobacco Use  Smoking Status Never  Smokeless Tobacco Never  Tobacco Comments   both parents smoked in home growing up.     Goals Met:  Proper associated with RPD/PD & O2 Sat Independence with exercise equipment Exercise tolerated well No report of concerns or symptoms today Strength training completed today  Goals Unmet:  Not Applicable  Comments: Service time is from 1022 to 1150.    Dr. Mechele Collin is Medical Director for Pulmonary Rehab at [facility].

## 2023-02-27 ENCOUNTER — Encounter (HOSPITAL_COMMUNITY)
Admission: RE | Admit: 2023-02-27 | Discharge: 2023-02-27 | Disposition: A | Discharge status: Home / Self Care | Payer: BC Managed Care – PPO | Source: Ambulatory Visit | Attending: Internal Medicine | Admitting: Internal Medicine

## 2023-02-27 DIAGNOSIS — R0602 Shortness of breath: Secondary | ICD-10-CM | POA: Diagnosis present

## 2023-02-27 NOTE — Progress Notes (Signed)
Daily Session Note  Patient Details  Name: George Wallace MRN: [id number] Date of Birth: November 24, 1960 Referring Provider:   Doristine Devoid Pulmonary Rehab Walk Test from 12/09/2022 in [facility] for Heart, Vascular, & Lung Health  Referring Provider [name]       Encounter Date: 02/27/2023  Check In:  Session Check In - 02/27/23 1127       Check-In   Supervising physician immediately available to respond to emergencies [facility] immediately available    Physician(s) Edd Fabian, NP    Location [facility]    Staff Present Essie Hart, RN, Doris Cheadle, MS, ACSM-CEP, Exercise Physiologist;Randi Dionisio Paschal, ACSM-CEP, Exercise Physiologist;Bence Trapp Katrinka Blazing, RT    Virtual Visit No    Medication changes reported     No    Fall or balance concerns reported    No    Tobacco Cessation No Change    Warm-up and Cool-down Performed as group-led instruction    Resistance Training Performed Yes    VAD Patient? No    PAD/SET Patient? No      Pain Assessment   Currently in Pain? No/denies    Multiple Pain Sites No             Capillary Blood Glucose: No results found for this or any previous visit (from the past 24 hour(s)).    Social History   Tobacco Use  Smoking Status Never  Smokeless Tobacco Never  Tobacco Comments   both parents smoked in home growing up.     Goals Met:  Proper associated with RPD/PD & O2 Sat Independence with exercise equipment Exercise tolerated well No report of concerns or symptoms today  Goals Unmet:  Not Applicable  Comments: Service time is from 0947 to 1010.    Dr. Mechele Collin is Medical Director for Pulmonary Rehab at [facility].

## 2023-03-04 ENCOUNTER — Encounter (HOSPITAL_COMMUNITY): Payer: BC Managed Care – PPO

## 2023-03-06 ENCOUNTER — Encounter (HOSPITAL_COMMUNITY)
Admission: RE | Admit: 2023-03-06 | Discharge: 2023-03-06 | Disposition: A | Discharge status: Home / Self Care | Payer: BC Managed Care – PPO | Source: Ambulatory Visit | Attending: Internal Medicine | Admitting: Internal Medicine

## 2023-03-06 DIAGNOSIS — R0602 Shortness of breath: Secondary | ICD-10-CM | POA: Diagnosis not present

## 2023-03-06 NOTE — Progress Notes (Signed)
Daily Session Note  Patient Details  Name: George Wallace MRN: [id number] Date of Birth: [birth year] Referring Provider:   Doristine Devoid Pulmonary Rehab Walk Test from 12/09/2022 in [facility] for Heart, Vascular, & Lung Health  Referring Provider [name]       Encounter Date: 03/06/2023  Check In:  Session Check In - 03/06/23 1124       Check-In   Supervising physician immediately available to respond to emergencies [facility] immediately available    Physician(s) Jari Favre, PA    Location [facility]    Staff Present Essie Hart, RN, Doris Cheadle, MS, ACSM-CEP, Exercise Physiologist;Randi Dionisio Paschal, ACSM-CEP, Exercise Physiologist;Casey Heloise Ochoa, MS, ACSM-CEP, CCRP, Exercise Physiologist    Virtual Visit No    Medication changes reported     No    Fall or balance concerns reported    No    Tobacco Cessation No Change    Warm-up and Cool-down Performed as group-led instruction    Resistance Training Performed Yes    VAD Patient? No    PAD/SET Patient? No      Pain Assessment   Currently in Pain? No/denies    Multiple Pain Sites No             Capillary Blood Glucose: No results found for this or any previous visit (from the past 24 hour(s)).    Social History   Tobacco Use  Smoking Status Never  Smokeless Tobacco Never  Tobacco Comments   both parents smoked in home growing up.     Goals Met:  Proper associated with RPD/PD & O2 Sat Independence with exercise equipment Exercise tolerated well No report of concerns or symptoms today Strength training completed today  Goals Unmet:  Not Applicable  Comments: Service time is from 1018 to 1147.    Dr. Mechele Collin is Medical Director for Pulmonary Rehab at [facility].

## 2023-03-11 ENCOUNTER — Encounter (HOSPITAL_COMMUNITY)
Admission: RE | Admit: 2023-03-11 | Discharge: 2023-03-11 | Disposition: A | Discharge status: Home / Self Care | Payer: BC Managed Care – PPO | Source: Ambulatory Visit | Attending: Internal Medicine | Admitting: Internal Medicine

## 2023-03-11 VITALS — Wt 262.8 lb

## 2023-03-11 DIAGNOSIS — R0602 Shortness of breath: Secondary | ICD-10-CM | POA: Diagnosis not present

## 2023-03-11 NOTE — Progress Notes (Signed)
Daily Session Note  Patient Details  Name: George Wallace MRN: [id number] Date of Birth: Sep 01, 1961 Referring Provider:   Doristine Devoid Pulmonary Rehab Walk Test from 12/09/2022 in [facility] for Heart, Vascular, & Lung Health  Referring Provider [name]       Encounter Date: 03/11/2023  Check In:  Session Check In - 03/11/23 1123       Check-In   Supervising physician immediately available to respond to emergencies [facility] MD immediately available    Physician(s) Jari Favre, PA    [facility]    Staff Present Samantha Belarus, RD, Dutch Gray, RN, BSN;Randi Reeve BS, ACSM-CEP, Exercise Physiologist;Olinty Peggye Pitt, MS, ACSM-CEP, Exercise Physiologist;Chima Astorino Earlene Plater, MS, ACSM-CEP, Exercise Physiologist;Casey Katrinka Blazing, RT    Virtual Visit No    Medication changes reported     No    Fall or balance concerns reported    No    Tobacco Cessation No Change    Warm-up and Cool-down Performed as group-led instruction    Resistance Training Performed Yes    VAD Patient? No    PAD/SET Patient? No      Pain Assessment   Currently in Pain? No/denies    Multiple Pain Sites No             Capillary Blood Glucose: No results found for this or any previous visit (from the past 24 hour(s)).   Exercise Prescription Changes - 03/11/23 1200       Response to Exercise   Blood Pressure (Admit) 110/62    Blood Pressure (Exercise) 148/62    Blood Pressure (Exit) 102/60    Heart Rate (Admit) 76 bpm    Heart Rate (Exercise) 124 bpm    Heart Rate (Exit) 93 bpm    Oxygen Saturation (Admit) 100 %    Oxygen Saturation (Exercise) 95 %    Oxygen Saturation (Exit) 96 %    Rating of Perceived Exertion (Exercise) 11    Perceived Dyspnea (Exercise) 2    Duration Continue with 30 min of aerobic exercise without signs/symptoms of physical distress.    Intensity THRR unchanged      Progression   Progression Continue to progress workloads to maintain  intensity without signs/symptoms of physical distress.      Resistance Training   Training Prescription Yes    Weight black bands    Reps 10-15    Time 10 Minutes      Treadmill   MPH 2.5    Grade 3.5    Minutes 15    METs 4.12      Elliptical   Level 3    Speed 2    Minutes 15    METs 4.6             Social History   Tobacco Use  Smoking Status Never  Smokeless Tobacco Never  Tobacco Comments   both parents smoked in home growing up.     Goals Met:  Proper associated with RPD/PD & O2 Sat Independence with exercise equipment Exercise tolerated well No report of concerns or symptoms today Strength training completed today  Goals Unmet:  Not Applicable  Comments: Service time is from 1021 to 1132.    Dr. Mechele Collin is Medical Director for Pulmonary Rehab at [facility].

## 2023-03-12 NOTE — Progress Notes (Signed)
Discharge Progress Report  Patient Details  Name: George Wallace MRN: [id number] Date of Birth: 09/15/1961 Referring Provider:   Doristine Devoid Pulmonary Rehab Walk Test from 12/09/2022 in [facility]  Referring Provider [name]        Number of Visits: 5  Reason for Discharge:  Patient has met program and personal goals.  Smoking History:  Social History   Tobacco Use  Smoking Status Never  Smokeless Tobacco Never  Tobacco Comments   both parents smoked in home growing up.     Diagnosis:  Shortness of breath  ADL UCSD:  Pulmonary Assessment Scores     Row Name 12/09/22 0934 03/11/23 1528       ADL UCSD   ADL Phase Entry Exit    SOB Score total 4 33      CAT Score   CAT Score 13 0      mMRC Score   mMRC Score 1 --             Initial Exercise Prescription:  Initial Exercise Prescription - 12/09/22 1000       Date of Initial Exercise RX and Referring Provider   Date 12/09/22    Referring Provider [name]    Expected Discharge Date 03/06/23      Recumbant Elliptical   Level 1    RPM 20    Minutes 15      Track   Minutes 15    METs 2.07      Prescription Details   Frequency (times per week) 2    Duration Progress to 30 minutes of continuous aerobic without signs/symptoms of physical distress      Intensity   THRR 40-80% of Max Heartrate 81-129    Ratings of Perceived Exertion 11-13    Perceived Dyspnea 0-4      Progression   Progression Continue to progress workloads to maintain intensity without signs/symptoms of physical distress.      Resistance Training   Training Prescription Yes    Weight blue bands    Reps 10-15             Discharge Exercise Prescription (Final Exercise Prescription Changes):  Exercise Prescription Changes - 03/11/23 1200       Response to Exercise   Blood Pressure (Admit) 110/62    Blood Pressure (Exercise) 148/62    Blood Pressure (Exit)  102/60    Heart Rate (Admit) 76 bpm    Heart Rate (Exercise) 124 bpm    Heart Rate (Exit) 93 bpm    Oxygen Saturation (Admit) 100 %    Oxygen Saturation (Exercise) 95 %    Oxygen Saturation (Exit) 96 %    Rating of Perceived Exertion (Exercise) 11    Perceived Dyspnea (Exercise) 2    Duration Continue with 30 min of aerobic exercise without signs/symptoms of physical distress.    Intensity THRR unchanged      Progression   Progression Continue to progress workloads to maintain intensity without signs/symptoms of physical distress.      Resistance Training   Training Prescription Yes    Weight black bands    Reps 10-15    Time 10 Minutes      Treadmill   MPH 2.5    Grade 3.5    Minutes 15    METs 4.12      Elliptical   Level 3    Speed 2    Minutes 15  METs 4.6             Functional Capacity:  6 Minute Walk     Row Name 12/09/22 1035 02/27/23 1600       6 Minute Walk   Phase Initial Discharge    Distance 960 feet 1380 feet    Distance % Change -- 43.75 %    Distance Feet Change -- 420 ft    Walk Time 6 minutes 6 minutes    # of Rest Breaks 0 0    MPH 1.82 2.61    METS 2.07 3.2    RPE 7 11    Perceived Dyspnea  0.5 1    VO2 Peak 7.23 11.19    Symptoms No No    Resting HR 64 bpm 76 bpm    Resting BP 108/68 122/72    Resting Oxygen Saturation  100 % 98 %    Exercise Oxygen Saturation  during 6 min walk 92 % 90 %    Max Ex. HR 96 bpm 112 bpm    Max Ex. BP 122/66 152/80    2 Minute Post BP 120/62 122/68      Interval HR   1 Minute HR 69 100    2 Minute HR 80 103    3 Minute HR 80 96    4 Minute HR 96 104    5 Minute HR 88 107    6 Minute HR 93 112    2 Minute Post HR 62 82    Interval Heart Rate? Yes Yes      Interval Oxygen   Interval Oxygen? Yes Yes    Baseline Oxygen Saturation % 100 % 98 %    1 Minute Oxygen Saturation % 94 % 99 %    1 Minute Liters of Oxygen 0 L 0 L    2 Minute Oxygen Saturation % 94 % 91 %    2 Minute Liters of  Oxygen 0 L 0 L    3 Minute Oxygen Saturation % 99 % 95 %    3 Minute Liters of Oxygen 0 L 0 L    4 Minute Oxygen Saturation % 94 % 91 %    4 Minute Liters of Oxygen 0 L 0 L    5 Minute Oxygen Saturation % 92 % 90 %    5 Minute Liters of Oxygen 0 L 0 L    6 Minute Oxygen Saturation % 94 % 93 %    6 Minute Liters of Oxygen 0 L 0 L    2 Minute Post Oxygen Saturation % 99 % 96 %    2 Minute Post Liters of Oxygen 0 L 0 L             Psychological, QOL, Others - Outcomes: PHQ 2/9:    03/11/2023    3:37 PM 12/09/2022   10:00 AM  Depression screen PHQ 2/9  Decreased Interest 0 0  Down, Depressed, Hopeless 0 0  PHQ - 2 Score 0 0  Altered sleeping 0 0  Tired, decreased energy 0 0  Change in appetite 0 0  Feeling bad or failure about yourself  1 0  Trouble concentrating 0 0  Moving slowly or fidgety/restless 0 0  Suicidal thoughts 0 0  PHQ-9 Score 1 0  Difficult doing work/chores Not difficult at all Not difficult at all    Quality of Life:   Personal Goals: Goals established at orientation with interventions provided to work toward goal.  Personal Goals and Risk Factors at Admission - 12/09/22 0939       Core Components/Risk Factors/Patient Goals on Admission    Weight Management Weight Loss;Yes    Intervention Weight Management: Develop a combined nutrition and exercise program designed to reach desired caloric intake, while maintaining appropriate intake of nutrient and fiber, sodium and fats, and appropriate energy expenditure required for the weight goal.;Weight Management: Provide education and appropriate resources to help participant work on and attain dietary goals.;Weight Management/Obesity: Establish reasonable short term and long term weight goals.;Obesity: Provide education and appropriate resources to help participant work on and attain dietary goals.    Admit Weight 271 lb 9.7 oz (123.2 kg)    Expected Outcomes Short Term: Continue to assess and modify  interventions until short term weight is achieved;Long Term: Adherence to nutrition and physical activity/exercise program aimed toward attainment of established weight goal;Weight Maintenance: Understanding of the daily nutrition guidelines, which includes 25-35% calories from fat, 7% or less cal from saturated fats, less than 200mg  cholesterol, less than 1.5gm of sodium, & 5 or more servings of fruits and vegetables daily;Weight Loss: Understanding of general recommendations for a balanced deficit meal plan, which promotes 1-2 lb weight loss per week and includes a negative energy balance of [phone] kcal/d;Understanding recommendations for meals to include 15-35% energy as protein, 25-35% energy from fat, 35-60% energy from carbohydrates, less than 200mg  of dietary cholesterol, 20-35 gm of total fiber daily;Understanding of distribution of calorie intake throughout the day with the consumption of 4-5 meals/snacks    Improve shortness of breath with ADL's Yes    Intervention Provide education, individualized exercise plan and daily activity instruction to help decrease symptoms of SOB with activities of daily living.    Expected Outcomes Short Term: Improve cardiorespiratory fitness to achieve a reduction of symptoms when performing ADLs;Long Term: Be able to perform more ADLs without symptoms or delay the onset of symptoms              Personal Goals Discharge:  Goals and Risk Factor Review     Row Name 12/09/22 1254 01/01/23 1136 01/24/23 1104         Core Components/Risk Factors/Patient Goals Review   Personal Goals Review Weight Management/Obesity;Improve shortness of breath with ADL's;Develop more efficient breathing techniques such as purse lipped breathing and diaphragmatic breathing and practicing self-pacing with activity. Develop more efficient breathing techniques such as purse lipped breathing and diaphragmatic breathing and practicing self-pacing with activity.;Increase knowledge of  respiratory medications and ability to use respiratory devices properly.;Improve shortness of breath with ADL's Develop more efficient breathing techniques such as purse lipped breathing and diaphragmatic breathing and practicing self-pacing with activity.;Improve shortness of breath with ADL's;Weight Management/Obesity;Increase knowledge of respiratory medications and ability to use respiratory devices properly.;Stress     Review Kannen is scheduled to start the program on 12/17/22 Mclane has completed 5 PR sessions so far. He is currently exercising on the treadmill and the Nustep. He has been able to increase his intensity on the treadmill. He has also been able to increase his workload and METs on the NuStep. He is practicing pursed lip breathing. He has attended the warning signs and symptoms and beating the sedentary lifestyle classes. Laroy has not been consistent with taking his medications. Staff reinterated the importance of being compliant with his medications. We will continue to assess any progress he makes during PR classes. Seyon has completed 12 PR sessions so far. He is currently exercising on the treadmill  and the Nustep. He has been able to increase his speed and incline on the treadmill. He has also been able to increase his workload and METs on the NuStep. He is practicing pursed lip breathing when he is short of breath. He has attended the warning signs and symptoms, beating the sedentary lifestyle, and the pulmonary medications classes. Ibn has not been consistent with taking his medications. Staff reiterated the importance of being compliant with his medications. Lomar has not lost any weight while in the program. He is working with our dietitian on healthy choices. We will continue to assess any progress he makes during PR classes.     Expected Outcomes See Admission Goals See admission goals See admission goals              Exercise Goals and Review:  Exercise Goals      Row Name 12/09/22 0935 12/10/22 1651 01/02/23 1647 01/27/23 0926       Exercise Goals   Increase Physical Activity Yes Yes Yes Yes    Intervention Provide advice, education, support and counseling about physical activity/exercise needs.;Develop an individualized exercise prescription for aerobic and resistive training based on initial evaluation findings, risk stratification, comorbidities and participant's personal goals. Provide advice, education, support and counseling about physical activity/exercise needs.;Develop an individualized exercise prescription for aerobic and resistive training based on initial evaluation findings, risk stratification, comorbidities and participant's personal goals. Provide advice, education, support and counseling about physical activity/exercise needs.;Develop an individualized exercise prescription for aerobic and resistive training based on initial evaluation findings, risk stratification, comorbidities and participant's personal goals. Provide advice, education, support and counseling about physical activity/exercise needs.;Develop an individualized exercise prescription for aerobic and resistive training based on initial evaluation findings, risk stratification, comorbidities and participant's personal goals.    Expected Outcomes Short Term: Attend rehab on a regular basis to increase amount of physical activity.;Long Term: Add in home exercise to make exercise part of routine and to increase amount of physical activity.;Long Term: Exercising regularly at least 3-5 days a week. Short Term: Attend rehab on a regular basis to increase amount of physical activity.;Long Term: Add in home exercise to make exercise part of routine and to increase amount of physical activity.;Long Term: Exercising regularly at least 3-5 days a week. Short Term: Attend rehab on a regular basis to increase amount of physical activity.;Long Term: Add in home exercise to make exercise part of  routine and to increase amount of physical activity.;Long Term: Exercising regularly at least 3-5 days a week. Short Term: Attend rehab on a regular basis to increase amount of physical activity.;Long Term: Add in home exercise to make exercise part of routine and to increase amount of physical activity.;Long Term: Exercising regularly at least 3-5 days a week.    Increase Strength and Stamina Yes Yes Yes Yes    Intervention Provide advice, education, support and counseling about physical activity/exercise needs.;Develop an individualized exercise prescription for aerobic and resistive training based on initial evaluation findings, risk stratification, comorbidities and participant's personal goals. Provide advice, education, support and counseling about physical activity/exercise needs.;Develop an individualized exercise prescription for aerobic and resistive training based on initial evaluation findings, risk stratification, comorbidities and participant's personal goals. Provide advice, education, support and counseling about physical activity/exercise needs.;Develop an individualized exercise prescription for aerobic and resistive training based on initial evaluation findings, risk stratification, comorbidities and participant's personal goals. Provide advice, education, support and counseling about physical activity/exercise needs.;Develop an individualized exercise prescription for aerobic and resistive  training based on initial evaluation findings, risk stratification, comorbidities and participant's personal goals.    Expected Outcomes Short Term: Increase workloads from initial exercise prescription for resistance, speed, and METs.;Short Term: Perform resistance training exercises routinely during rehab and add in resistance training at home;Long Term: Improve cardiorespiratory fitness, muscular endurance and strength as measured by increased METs and functional capacity ( ) Short Term: Increase  workloads from initial exercise prescription for resistance, speed, and METs.;Short Term: Perform resistance training exercises routinely during rehab and add in resistance training at home;Long Term: Improve cardiorespiratory fitness, muscular endurance and strength as measured by increased METs and functional capacity ( ) Short Term: Increase workloads from initial exercise prescription for resistance, speed, and METs.;Short Term: Perform resistance training exercises routinely during rehab and add in resistance training at home;Long Term: Improve cardiorespiratory fitness, muscular endurance and strength as measured by increased METs and functional capacity ( ) Short Term: Increase workloads from initial exercise prescription for resistance, speed, and METs.;Short Term: Perform resistance training exercises routinely during rehab and add in resistance training at home;Long Term: Improve cardiorespiratory fitness, muscular endurance and strength as measured by increased METs and functional capacity ( )    Able to understand and use rate of perceived exertion (RPE) scale Yes Yes Yes Yes    Intervention Provide education and explanation on how to use RPE scale Provide education and explanation on how to use RPE scale Provide education and explanation on how to use RPE scale Provide education and explanation on how to use RPE scale    Expected Outcomes Short Term: Able to use RPE daily in rehab to express subjective intensity level;Long Term:  Able to use RPE to guide intensity level when exercising independently Short Term: Able to use RPE daily in rehab to express subjective intensity level;Long Term:  Able to use RPE to guide intensity level when exercising independently Short Term: Able to use RPE daily in rehab to express subjective intensity level;Long Term:  Able to use RPE to guide intensity level when exercising independently Short Term: Able to use RPE daily in rehab to express subjective  intensity level;Long Term:  Able to use RPE to guide intensity level when exercising independently    Able to understand and use Dyspnea scale Yes Yes Yes Yes    Intervention Provide education and explanation on how to use Dyspnea scale Provide education and explanation on how to use Dyspnea scale Provide education and explanation on how to use Dyspnea scale Provide education and explanation on how to use Dyspnea scale    Expected Outcomes Short Term: Able to use Dyspnea scale daily in rehab to express subjective sense of shortness of breath during exertion;Long Term: Able to use Dyspnea scale to guide intensity level when exercising independently Short Term: Able to use Dyspnea scale daily in rehab to express subjective sense of shortness of breath during exertion;Long Term: Able to use Dyspnea scale to guide intensity level when exercising independently Short Term: Able to use Dyspnea scale daily in rehab to express subjective sense of shortness of breath during exertion;Long Term: Able to use Dyspnea scale to guide intensity level when exercising independently Short Term: Able to use Dyspnea scale daily in rehab to express subjective sense of shortness of breath during exertion;Long Term: Able to use Dyspnea scale to guide intensity level when exercising independently    Knowledge and understanding of Target Heart Rate Range (THRR) Yes Yes Yes Yes    Intervention Provide education and explanation of THRR including how the numbers  were predicted and where they are located for reference Provide education and explanation of THRR including how the numbers were predicted and where they are located for reference Provide education and explanation of THRR including how the numbers were predicted and where they are located for reference Provide education and explanation of THRR including how the numbers were predicted and where they are located for reference    Expected Outcomes Short Term: Able to state/look up  THRR;Long Term: Able to use THRR to govern intensity when exercising independently;Short Term: Able to use daily as guideline for intensity in rehab Short Term: Able to state/look up THRR;Long Term: Able to use THRR to govern intensity when exercising independently;Short Term: Able to use daily as guideline for intensity in rehab Short Term: Able to state/look up THRR;Long Term: Able to use THRR to govern intensity when exercising independently;Short Term: Able to use daily as guideline for intensity in rehab Short Term: Able to state/look up THRR;Long Term: Able to use THRR to govern intensity when exercising independently;Short Term: Able to use daily as guideline for intensity in rehab    Understanding of Exercise Prescription Yes Yes Yes Yes    Intervention Provide education, explanation, and written materials on patient's individual exercise prescription Provide education, explanation, and written materials on patient's individual exercise prescription Provide education, explanation, and written materials on patient's individual exercise prescription Provide education, explanation, and written materials on patient's individual exercise prescription    Expected Outcomes Short Term: Able to explain program exercise prescription;Long Term: Able to explain home exercise prescription to exercise independently Short Term: Able to explain program exercise prescription;Long Term: Able to explain home exercise prescription to exercise independently Short Term: Able to explain program exercise prescription;Long Term: Able to explain home exercise prescription to exercise independently Short Term: Able to explain program exercise prescription;Long Term: Able to explain home exercise prescription to exercise independently             Exercise Goals Re-Evaluation:  Exercise Goals Re-Evaluation     Row Name 12/10/22 1651 01/02/23 1648 01/27/23 0926         Exercise Goal Re-Evaluation   Exercise Goals  Review Increase Physical Activity;Able to understand and use Dyspnea scale;Understanding of Exercise Prescription;Increase Strength and Stamina;Knowledge and understanding of Target Heart Rate Range (THRR);Able to understand and use rate of perceived exertion (RPE) scale Increase Physical Activity;Able to understand and use Dyspnea scale;Understanding of Exercise Prescription;Increase Strength and Stamina;Knowledge and understanding of Target Heart Rate Range (THRR);Able to understand and use rate of perceived exertion (RPE) scale Increase Physical Activity;Able to understand and use Dyspnea scale;Understanding of Exercise Prescription;Increase Strength and Stamina;Knowledge and understanding of Target Heart Rate Range (THRR);Able to understand and use rate of perceived exertion (RPE) scale     Comments Shawnta is scheduled to begin exercise this week. Will continue to monitor and progress as able. Casius has completed 6 exercise sessions. He exercises for 15 min on the treadmill and Nustep. He averages 3.24 METs on the treadmill and 1.8 METs at level 3 on the Nustep. Makhari performs the warmup and cooldown standing without limitations. Nycere has been progressed from track walking to the treadmill. He tolerates the treadmill well. He has increased his workload for both exercise modes as vital sign have maintained. Jurem seems very motivated to exercise. I believe he enjoys coming to rehab and conversing with other patients. Will continue to monitor and progress as able. Thorsten has completed 11 exercise sessions. He exercises for 15 min on  the treadmill and Nustep. He averages 4.0 METs on the treadmill and 2.3 METs at level 3 on the Nustep. Jamesrobert performs the warmup and cooldown standing without limitations. Mekhi has increased his workload for the treadmill. He tolerates this progression well. He has not increased his workload for the Nustep yet, although I have encouraged him to increase his METs. Will  increase workload on the Nustep. Will continue to monitor and progress as able.     Expected Outcomes Through exercise at rehab and home, the patient will decrease shortness of breath with daily activities and feel confident in carrying out an exercise regimen at home. Through exercise at rehab and home, the patient will decrease shortness of breath with daily activities and feel confident in carrying out an exercise regimen at home. Through exercise at rehab and home, the patient will decrease shortness of breath with daily activities and feel confident in carrying out an exercise regimen at home.              Nutrition & Weight - Outcomes:  Pre Biometrics - 12/09/22 0927       Pre Biometrics   Grip Strength 31 kg             Post Biometrics - 02/27/23 1558        Post  Biometrics   Grip Strength 34 kg             Nutrition:  Nutrition Therapy & Goals - 02/13/23 1006       Nutrition Therapy   Diet Heart Healthy/Carbohydrate Consistent diet    Drug/Food Interactions Statins/Certain Fruits      Personal Nutrition Goals   Nutrition Goal Patient to improve diet quality by using the plate method as a daily guide for meal planning to include lean protein/plant protein, fruits, vegetables, whole grains, nonfat dairy as part of well balanced diet    Personal Goal #2 Patient to identify food sources and limit daily intake of saturated fat, sodium, refined carbohydrates    Personal Goal #3 Patient to reduce sodium to <2000 mg per day    Comments Goals in progress. 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 He will need his BiPAP for life to prevent CO2 narcosis. He continues to struggle with consistent, restful sleep. He does enjoy cooking; reviewed reading food labels for sodium and salt free seasoning blends. He is being more consistent with medications. A1c improved. Dionte will benefit from participation in pulmonary  rehab for nutrition, exercise, and lifestyle modfication.      Intervention Plan   Intervention Prescribe, educate and counsel regarding individualized specific dietary modifications aiming towards targeted core components such as weight, hypertension, lipid management, diabetes, heart failure and other comorbidities.;Nutrition handout(s) given to patient.    Expected Outcomes Short Term Goal: Understand basic principles of dietary content, such as calories, fat, sodium, cholesterol and nutrients.;Long Term Goal: Adherence to prescribed nutrition plan.             Nutrition Discharge:   Education Questionnaire Score:  Knowledge Questionnaire Score - 03/11/23 1529       Knowledge Questionnaire Score   Post Score 17/18             Goals reviewed with patient; copy given to patient.

## 2023-03-14 ENCOUNTER — Ambulatory Visit (INDEPENDENT_AMBULATORY_CARE_PROVIDER_SITE_OTHER): Payer: Self-pay | Admitting: Thoracic Surgery (Cardiothoracic Vascular Surgery)

## 2023-03-14 ENCOUNTER — Encounter: Payer: Self-pay | Admitting: Thoracic Surgery (Cardiothoracic Vascular Surgery)

## 2023-03-14 VITALS — BP 120/79 | HR 66 | Resp 20 | Ht 69.0 in | Wt 265.0 lb

## 2023-03-14 DIAGNOSIS — J986 Disorders of diaphragm: Secondary | ICD-10-CM

## 2023-03-14 NOTE — Progress Notes (Signed)
     [address]       [place] [address]             [phone]       62-year-old male presents for follow-up.  He originally underwent mediastinal mass resection requiring sternotomy and ascending of last year.  The phrenic nerve was involved and he has an elevated left hemidiaphragm.  A recently completed pulmonary rehab and has had his exercise tolerance since breathing mechanics.  He has some occasional chest and back pain.  He states that he is not quite ready to return to work since he continues to use the BiPAP as needed.  I think that a lot of his issues are mostly related to his anxiety.  He does have a history of claustrophobia and thus with his diaphragm being out that shortness of breath does trigger his anxiety.  I recommended that he meet with a mental health specialist to begin treatment for his anxiety.  Once this is completed, we will assess his readiness to return back to work.

## 2023-03-19 DIAGNOSIS — E119 Type 2 diabetes mellitus without complications: Secondary | ICD-10-CM | POA: Diagnosis not present

## 2023-03-19 DIAGNOSIS — J9600 Acute respiratory failure, unspecified whether with hypoxia or hypercapnia: Secondary | ICD-10-CM | POA: Diagnosis not present

## 2023-03-19 DIAGNOSIS — I700 Atherosclerosis of aorta: Secondary | ICD-10-CM | POA: Diagnosis not present

## 2023-03-19 DIAGNOSIS — J986 Disorders of diaphragm: Secondary | ICD-10-CM | POA: Diagnosis not present

## 2023-03-23 DIAGNOSIS — J9610 Chronic respiratory failure, unspecified whether with hypoxia or hypercapnia: Secondary | ICD-10-CM | POA: Diagnosis not present

## 2023-03-23 DIAGNOSIS — J984 Other disorders of lung: Secondary | ICD-10-CM | POA: Diagnosis not present

## 2023-03-31 NOTE — Progress Notes (Signed)
Cardiology Office Note:    Date:  04/09/2023   ID:  Ronnell Horng, DOB 1961/02/11, MRN [id number]  PCP:  Gweneth Dimitri, MD   [facility] Providers Cardiologist:  None {  Referring MD: Gweneth Dimitri, MD     History of Present Illness:    Francesco Milledge is a 62 y.o. 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  Per review of the chart, the patient was originally undergoing CT scan for Ca score and was noted to have a nodular mass in the thymus.PET/CT scan which showed avidity in the anterior mediastinal mass concerning for lymphoma. Biopsies at [facility] were nonconclusive. 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 Cliffton Asters on 10/07/2022. Some of the left phrenic nerve was deeply invested into the tumor. It was taken en bloc resulting in left hemidiaphragm elevation. Now requires BiPAP.   Was seen in follow-up by Jefm Bryant on 11/22/22. He was feeling better. Was planned for possible plication of his left hemidiaphragm in the future. TTE 10/2022 with LVEF 60-65%, normal RV, no significant valve disease. Was notably having significant LE edema at that visit prompting referral to Cardiology.  Was initially seen in 10/2022 where he was having significant LE edema. We started lasix at that time with significant improvement. Amiodarone dose was decreased (had post-op Afib). Cardiac monitor with no Afib at that time.  Was last seen by Eligha Bridegroom on 12/2022 where he had self discontinued medications but was overall stable from a volume standpoint.  Today, the patient overall feels okay. Has been worried about having plication surgery and has yet to pursue this. He continues to struggle with SOB but states this is better from prior visit. Also with continued  intermittent chest discomfort but again this is improving. Has continued LE edema and has been taking lasix which has helped significantly. Uses his BiPAP intermittently. Continues to sleep in his recliner which is unchanged following his surgery.    Past Medical History:  Diagnosis Date   Diabetes (HCC)    Diet controlled   Hyperlipidemia     Past Surgical History:  Procedure Laterality Date   INTERCOSTAL NERVE BLOCK  10/07/2022   Procedure: INTERCOSTAL NERVE BLOCK;  Surgeon: Corliss Skains, MD;  Location: [facility] OR;  Service: Thoracic;;   SINUS SURGERY WITH INSTATRAK      Current Medications: Current Meds  Medication Sig   Accu-Chek Softclix Lancets lancets Use to check blood sugar daily   acetaminophen (TYLENOL) 500 MG tablet Take 1,000 mg by mouth every 6 (six) hours as needed for moderate pain.   albuterol (VENTOLIN HFA) 108 (90 Base) MCG/ACT inhaler Inhale 2 puffs into the lungs every 4 (four) hours as needed for wheezing.   aspirin EC 325 MG tablet Take 1 tablet (325 mg total) by mouth daily. (Patient taking differently: Take 325 mg by mouth as needed.)   Blood Glucose Monitoring Suppl (ACCU-CHEK GUIDE) w/Device KIT Use to check blood sugar daily   furosemide (LASIX) 40 MG tablet Take 1 tablet (40 mg total) by mouth daily. You may take 1 extra dose (40 mg total) by mouth daily as needed for swelling/weight gain.   glucose blood (ACCU-CHEK GUIDE) test strip Use to check blood sugar daily   ibuprofen (ADVIL) 200 MG tablet Take 600 mg by mouth as  needed.   potassium chloride SA (KLOR-CON M) 20 MEQ tablet Take 1 tablet (20 mEq total) by mouth daily. You may take 1 extra dose (20 mEq total) by mouth daily as needed when you administer extra lasix.   rosuvastatin (CRESTOR) 10 MG tablet Take 10 mg by mouth every evening.   Spacer/Aero-Holding Chambers (AEROCHAMBER PLUS FLO-VU LARGE) MISC 1 each by Other route once.   [DISCONTINUED] furosemide (LASIX) 40 MG tablet Take 1 tablet (40  mg total) by mouth daily.   [DISCONTINUED] potassium chloride SA (KLOR-CON M) 20 MEQ tablet Take 1 tablet (20 mEq total) by mouth daily.     Allergies:   Patient has no known allergies.   Social History   Socioeconomic History   Marital status: Married    Spouse name: Not on file   Number of children: 2   Years of education: Not on file   Highest education level: Not on file  Occupational History   Not on file  Tobacco Use   Smoking status: Never   Smokeless tobacco: Never   Tobacco comments:    both parents smoked in home growing up.   Vaping Use   Vaping Use: Never used  Substance and Sexual Activity   Alcohol use: No    Alcohol/week: 0.0 standard drinks of alcohol   Drug use: No   Sexual activity: Not on file  Other Topics Concern   Not on file  Social History Narrative   Not on file   Social Determinants of Health   Financial Resource Strain: Not on file  Food Insecurity: No Food Insecurity (10/07/2022)   Hunger Vital Sign    Worried About Running Out of Food in the Last Year: Never true    Ran Out of Food in the Last Year: Never true  Transportation Needs: No Transportation Needs (10/07/2022)   PRAPARE - Administrator, Civil Service (Medical): No    Lack of Transportation (Non-Medical): No  Physical Activity: Not on file  Stress: Not on file  Social Connections: Not on file     Family History: The patient's family history includes Emphysema in his father; Heart disease in an other family member.  ROS:   Please see the history of present illness.     All other systems reviewed and are negative.  EKGs/Labs/Other Studies Reviewed:    The following studies were reviewed today: Echo 11/20/2022 1. Left ventricular ejection fraction, by estimation, is 60 to 65%. The  left ventricle has normal function. The left ventricle has no regional  wall motion abnormalities. Left ventricular diastolic parameters were  normal.   2. Right ventricular  systolic function is normal. The right ventricular  size is normal.   3. The mitral valve is normal in structure. No evidence of mitral valve  regurgitation. No evidence of mitral stenosis.   4. The aortic valve is normal in structure. Aortic valve regurgitation is  not visualized. No aortic stenosis is present.   5. The inferior vena cava is normal in size with greater than 50%  respiratory variability, suggesting right atrial pressure of 3 mmHg.  EKG:  No new tracing today  Recent Labs: 10/03/2022: ALT 23 10/17/2022: Magnesium 2.3 11/27/2022: NT-Pro BNP <36 12/30/2022: BUN 10; Creatinine, Ser 1.11; Hemoglobin 13.4; Platelets 348; Potassium 3.9; Sodium 142  Recent Lipid Panel No results found for: "CHOL", "TRIG", "HDL", "CHOLHDL", "VLDL", "LDLCALC", "LDLDIRECT"   Risk Assessment/Calculations:  Physical Exam:    VS:  BP 120/76   Pulse 67   Ht 5\' 9"  (1.753 m)   Wt 270 lb (122.5 kg)   SpO2 97%   BMI 39.87 kg/m     Wt Readings from Last 3 Encounters:  04/09/23 270 lb (122.5 kg)  03/14/23 265 lb (120.2 kg)  03/11/23 262 lb 12.6 oz (119.2 kg)     GEN:  Well nourished, well developed in no acute distress HEENT: Normal NECK: No JVD; No carotid bruits CARDIAC: RRR, no murmurs, rubs, gallops RESPIRATORY:  Diminished at left lung base. Otherwise clear ABDOMEN: Soft, non-tender, non-distended MUSCULOSKELETAL:  1-2+ pitting edema to the knees. Warm SKIN: Warm and dry NEUROLOGIC:  Alert and oriented x 3 PSYCHIATRIC:  Normal affect   ASSESSMENT:    1. Bilateral lower extremity edema   2. Diaphragmatic paralysis   3. Atrial fibrillation, unspecified type (HCC)   4. Mediastinal mass   5. Elevated coronary artery calcium score   6. Medication management     PLAN:    In order of problems listed above:  #LE Edema: Improved with diuresis. TTE with LVEF 60-65%, normal diastolic function, normal filling pressures. No significant valve disease. BNP 10/2022  normal. Suspect this is mainly due to venous stasis. Declined referral to vascular at this time but may consider in the future. -Increase lasix 40mg  daily with extra dose as needed -Continue K+ daily with lasix with extra dose as needed -Declined referral to vascular for now; may consider in the future -Continue compression socks and leg elevation as able -Low Na diet discussed at length  #Coronary Artery Ca: Ca score 4.31 which is 25% for age, gender, race matched controls. Will continue with medical management. -Continue crestor 10mg  daily -LDL 71 -Goal LDL<70  #Post-op Atrial Fibrillation: Noted during admission for thymic mass removal. No recurrent Afib on zio. Off amiodarone. Not on AC due to CHADs-vasc 1 and isolated episode in post-op setting. -Off amiodarone -Monitor for recurrence  #Thymic Mass s/p Resection: #Left Hemidiaphragm: Patient with thymic mass requiring surgical removal. Mass was encasing the left phrenic nerve and it was removed en bloc. Now with left hemidiaphragm requiring BiPAP at night. Follows closely with Pulm and CT surgery.  #HTN: Well controlled and at goal <130/80. -Not on medications      Medication Adjustments/Labs and Tests Ordered: Current medicines are reviewed at length with the patient today.  Concerns regarding medicines are outlined above.  Orders Placed This Encounter  Procedures   Basic metabolic panel   Meds ordered this encounter  Medications   furosemide (LASIX) 40 MG tablet    Sig: Take 1 tablet (40 mg total) by mouth daily. You may take 1 extra dose (40 mg total) by mouth daily as needed for swelling/weight gain.    Dispense:  120 tablet    Refill:  2    Dose increase   potassium chloride SA (KLOR-CON M) 20 MEQ tablet    Sig: Take 1 tablet (20 mEq total) by mouth daily. You may take 1 extra dose (20 mEq total) by mouth daily as needed when you administer extra lasix.    Dispense:  120 tablet    Refill:  1    Dose  increase    Patient Instructions  Medication Instructions:   INCREASE YOUR LASIX TO 40 MG BY MOUTH DAILY AND YOU MAY TAKE AN EXTRA DOSE BY MOUTH DAILY AS NEEDED FOR SWELLING/WEIGHT GAIN  INCREASE YOUR POTASSIUM CHLORIDE TO 20 mEq  BY MOUTH DAILY AND YOU MAY TAKE AN EXTRA DOSE BY MOUTH DAILY AS NEEDED WHEN YOU ADMINISTER EXTRA LASIX  *If you need a refill on your cardiac medications before your next appointment, please call your pharmacy*   Lab Work:  IN 2 WEEKS HERE IN THE OFFICE--BMET  If you have labs (blood work) drawn today and your tests are completely normal, you will receive your results only by: MyChart Message (if you have MyChart) OR A paper copy in the mail If you have any lab test that is abnormal or we need to change your treatment, we will call you to review the results.    Follow-Up: At [facility], you and your health needs are our priority.  As part of our continuing mission to provide you with exceptional heart care, we have created designated Provider Care Teams.  These Care Teams include your primary Cardiologist (physician) and Advanced Practice Providers (APPs -  Physician Assistants and Nurse Practitioners) who all work together to provide you with the care you need, when you need it.  We recommend signing up for the patient portal called "MyChart".  Sign up information is provided on this After Visit Summary.  MyChart is used to connect with patients for Virtual Visits (Telemedicine).  Patients are able to view lab/test results, encounter notes, upcoming appointments, etc.  Non-urgent messages can be sent to your provider as well.   To learn more about what you can do with MyChart, go to [email or web address].    Your next appointment:   6 month(s)  Provider:   DR. MARK SKAINS    Signed, Meriam Sprague, MD  04/09/2023 9:19 AM    [facility]

## 2023-04-04 DIAGNOSIS — E785 Hyperlipidemia, unspecified: Secondary | ICD-10-CM | POA: Diagnosis not present

## 2023-04-04 DIAGNOSIS — E1169 Type 2 diabetes mellitus with other specified complication: Secondary | ICD-10-CM | POA: Diagnosis not present

## 2023-04-07 DIAGNOSIS — R0902 Hypoxemia: Secondary | ICD-10-CM | POA: Diagnosis not present

## 2023-04-07 DIAGNOSIS — J986 Disorders of diaphragm: Secondary | ICD-10-CM | POA: Diagnosis not present

## 2023-04-07 DIAGNOSIS — J984 Other disorders of lung: Secondary | ICD-10-CM | POA: Diagnosis not present

## 2023-04-07 DIAGNOSIS — E1169 Type 2 diabetes mellitus with other specified complication: Secondary | ICD-10-CM | POA: Diagnosis not present

## 2023-04-09 ENCOUNTER — Telehealth: Payer: Self-pay | Admitting: Cardiology

## 2023-04-09 ENCOUNTER — Encounter: Payer: Self-pay | Admitting: Cardiology

## 2023-04-09 ENCOUNTER — Ambulatory Visit: Payer: BC Managed Care – PPO | Attending: Cardiology | Admitting: Cardiology

## 2023-04-09 ENCOUNTER — Telehealth: Payer: Self-pay | Admitting: Acute Care

## 2023-04-09 VITALS — BP 120/76 | HR 67 | Ht 69.0 in | Wt 270.0 lb

## 2023-04-09 DIAGNOSIS — I4891 Unspecified atrial fibrillation: Secondary | ICD-10-CM

## 2023-04-09 DIAGNOSIS — R931 Abnormal findings on diagnostic imaging of heart and coronary circulation: Secondary | ICD-10-CM

## 2023-04-09 DIAGNOSIS — J986 Disorders of diaphragm: Secondary | ICD-10-CM

## 2023-04-09 DIAGNOSIS — R600 Localized edema: Secondary | ICD-10-CM | POA: Diagnosis not present

## 2023-04-09 DIAGNOSIS — J9859 Other diseases of mediastinum, not elsewhere classified: Secondary | ICD-10-CM | POA: Diagnosis not present

## 2023-04-09 DIAGNOSIS — Z79899 Other long term (current) drug therapy: Secondary | ICD-10-CM

## 2023-04-09 MED ORDER — FUROSEMIDE 40 MG PO TABS: 40.0000 mg | ORAL_TABLET | Freq: Every day | ORAL | 2 refills | Status: DC

## 2023-04-09 MED ORDER — POTASSIUM CHLORIDE CRYS ER 20 MEQ PO TBCR: 20.0000 meq | EXTENDED_RELEASE_TABLET | Freq: Every day | ORAL | 1 refills | Status: DC

## 2023-04-09 NOTE — Telephone Encounter (Signed)
[employer] [facility] attending physician's statement form received today.  Patient will come later to pay. Form in provider's box.

## 2023-04-09 NOTE — Telephone Encounter (Signed)
Patient dropped off disability forms. Put in Darilyn box. Patient phone number is [phone].

## 2023-04-09 NOTE — Telephone Encounter (Signed)
Forms received and given to Dr. Shari Prows to review and sign.  Will give completed forms to our front office team once Dr. Shari Prows completes.

## 2023-04-09 NOTE — Patient Instructions (Signed)
Medication Instructions:   INCREASE YOUR LASIX TO 40 MG BY MOUTH DAILY AND YOU MAY TAKE AN EXTRA DOSE BY MOUTH DAILY AS NEEDED FOR SWELLING/WEIGHT GAIN  INCREASE YOUR POTASSIUM CHLORIDE TO 20 mEq BY MOUTH DAILY AND YOU MAY TAKE AN EXTRA DOSE BY MOUTH DAILY AS NEEDED WHEN YOU ADMINISTER EXTRA LASIX  *If you need a refill on your cardiac medications before your next appointment, please call your pharmacy*   Lab Work:  IN 2 WEEKS HERE IN THE OFFICE--BMET  If you have labs (blood work) drawn today and your tests are completely normal, you will receive your results only by: MyChart Message (if you have MyChart) OR A paper copy in the mail If you have any lab test that is abnormal or we need to change your treatment, we will call you to review the results.    Follow-Up: At [facility], you and your health needs are our priority.  As part of our continuing mission to provide you with exceptional heart care, we have created designated Provider Care Teams.  These Care Teams include your primary Cardiologist (physician) and Advanced Practice Providers (APPs -  Physician Assistants and Nurse Practitioners) who all work together to provide you with the care you need, when you need it.  We recommend signing up for the patient portal called "MyChart".  Sign up information is provided on this After Visit Summary.  MyChart is used to connect with patients for Virtual Visits (Telemedicine).  Patients are able to view lab/test results, encounter notes, upcoming appointments, etc.  Non-urgent messages can be sent to your provider as well.   To learn more about what you can do with MyChart, go to [email or web address].    Your next appointment:   6 month(s)  Provider:   DR. MARK SKAINS

## 2023-04-10 NOTE — Telephone Encounter (Signed)
Dr. Shari Prows reviewed the forms pt dropped off and concurred that this information needs to be filled out by the right Providers office, whom took the pt out of work.  Per Dr. Shari Prows, these forms should be filled out by [employer], for they placed restrictions and took the pt out of work.   Will give these forms back to our front office team so that they can inform the pt of this, and refund him his money back for forms not being completed.

## 2023-04-10 NOTE — Telephone Encounter (Signed)
Called the pt back.   Pt states that all his treating Physician's, including PCP, [facility], and Dr. Shari Prows, have to fill the forms out.   Pt states he dropped the forms off to each treating Physicians offices for completion.   Dr. Cliffton Asters with [facility] filled out his copy and returned this to the pt, now he needs Dr. Shari Prows to fill out her copy and complete this.   Will obtain forms again from front office staff and provide these to Dr. Shari Prows to complete.  Will return forms back to the front office when completed by Dr. Shari Prows.

## 2023-04-10 NOTE — Telephone Encounter (Signed)
Form completed by Dr. Shari Prows and will be returned back to front office team to further manage and follow-up with the pt.

## 2023-04-10 NOTE — Telephone Encounter (Signed)
Patient is returning call they stated and requesting return call.

## 2023-04-10 NOTE — Telephone Encounter (Signed)
Called patient and let him know that Dr. Shari Prows could not complete the paperwork; that it had to go to [employer].  Patient said that [employer] already had a copy.

## 2023-04-15 ENCOUNTER — Other Ambulatory Visit: Payer: Self-pay | Admitting: Surgical

## 2023-04-17 NOTE — Telephone Encounter (Signed)
Spoke with patient who said that he would come by 04/18/23 to pay for form.

## 2023-04-19 DIAGNOSIS — I700 Atherosclerosis of aorta: Secondary | ICD-10-CM | POA: Diagnosis not present

## 2023-04-19 DIAGNOSIS — E119 Type 2 diabetes mellitus without complications: Secondary | ICD-10-CM | POA: Diagnosis not present

## 2023-04-19 DIAGNOSIS — J9600 Acute respiratory failure, unspecified whether with hypoxia or hypercapnia: Secondary | ICD-10-CM | POA: Diagnosis not present

## 2023-04-19 DIAGNOSIS — J986 Disorders of diaphragm: Secondary | ICD-10-CM | POA: Diagnosis not present

## 2023-04-21 NOTE — Telephone Encounter (Signed)
Completed [employer] form scanned to patient's documents and NOT sent to insurance. Patient did not pay for form completion so I let him know that it's waiting in his chart whenever he's ready.

## 2023-04-23 ENCOUNTER — Ambulatory Visit: Payer: BC Managed Care – PPO | Attending: Cardiology

## 2023-04-23 DIAGNOSIS — J984 Other disorders of lung: Secondary | ICD-10-CM | POA: Diagnosis not present

## 2023-04-23 DIAGNOSIS — R600 Localized edema: Secondary | ICD-10-CM | POA: Diagnosis not present

## 2023-04-23 DIAGNOSIS — J986 Disorders of diaphragm: Secondary | ICD-10-CM | POA: Diagnosis not present

## 2023-04-23 DIAGNOSIS — J9859 Other diseases of mediastinum, not elsewhere classified: Secondary | ICD-10-CM

## 2023-04-23 DIAGNOSIS — Z79899 Other long term (current) drug therapy: Secondary | ICD-10-CM

## 2023-04-23 DIAGNOSIS — I4891 Unspecified atrial fibrillation: Secondary | ICD-10-CM | POA: Diagnosis not present

## 2023-04-23 DIAGNOSIS — J9610 Chronic respiratory failure, unspecified whether with hypoxia or hypercapnia: Secondary | ICD-10-CM | POA: Diagnosis not present

## 2023-04-23 DIAGNOSIS — R931 Abnormal findings on diagnostic imaging of heart and coronary circulation: Secondary | ICD-10-CM

## 2023-04-24 LAB — BASIC METABOLIC PANEL
BUN/Creatinine Ratio: 13 (ref 10–24)
BUN: 15 mg/dL (ref 8–27)
CO2: 24 mmol/L (ref 20–29)
Calcium: 10.2 mg/dL (ref 8.6–10.2)
Chloride: 101 mmol/L (ref 96–106)
Creatinine, Ser: 1.2 mg/dL (ref 0.76–1.27)
Glucose: 95 mg/dL (ref 70–99)
Potassium: 4.5 mmol/L (ref 3.5–5.2)
Sodium: 143 mmol/L (ref 134–144)
eGFR: 69 mL/min/{1.73_m2} (ref >59)

## 2023-04-24 NOTE — Telephone Encounter (Signed)
Pt called in asking to speak with you about his documents.

## 2023-04-25 ENCOUNTER — Ambulatory Visit (INDEPENDENT_AMBULATORY_CARE_PROVIDER_SITE_OTHER): Payer: BC Managed Care – PPO | Admitting: Thoracic Surgery (Cardiothoracic Vascular Surgery)

## 2023-04-25 ENCOUNTER — Encounter: Payer: Self-pay | Admitting: Thoracic Surgery (Cardiothoracic Vascular Surgery)

## 2023-04-25 ENCOUNTER — Other Ambulatory Visit: Payer: Self-pay | Admitting: Thoracic Surgery (Cardiothoracic Vascular Surgery)

## 2023-04-25 VITALS — BP 122/81 | HR 77 | Resp 20 | Ht 69.0 in | Wt 267.0 lb

## 2023-04-25 DIAGNOSIS — J986 Disorders of diaphragm: Secondary | ICD-10-CM

## 2023-04-25 NOTE — Progress Notes (Signed)
     [address]       [place] [address]             [phone]       62-year-old male months, given follow-up to discuss returning to work.  He has not been very active and is back to working out, but states that he still is not able to return to work.  He would like to get pulmonary function testing as well as an ABG.  He has not been ordered.  From surgical standpoint he appears recovered from his operation.  I do think there is some component of his anxiety which is affecting his readiness to return to work.

## 2023-04-25 NOTE — Telephone Encounter (Signed)
Pt is asking if someone else can sign the paperwork. States insurance company sent to [place] over a month ago. I let him know that we didn't receive until he dropped off on 04/09/23. I also told him that we would need to get Maralyn Sago to sign and she would be back in office on 04/29/23.

## 2023-04-28 ENCOUNTER — Telehealth: Payer: Self-pay | Admitting: Acute Care

## 2023-04-28 NOTE — Telephone Encounter (Signed)
On Friday, June 28, I spoke to patient regarding his disability paperwork and explained Kandice Robinsons, NP would not be back in the office until Tues, July 2.  I also let him know I had called [employer] and left a voice message for the claims processor that the form would be returned on July 2 and that I had faxed 47 pages of office visit notes going back to 10/23/2022, per their request.  [employer] fax# [phone]

## 2023-04-29 ENCOUNTER — Other Ambulatory Visit: Payer: Self-pay | Admitting: Thoracic Surgery (Cardiothoracic Vascular Surgery)

## 2023-04-29 ENCOUNTER — Telehealth: Payer: Self-pay | Admitting: Acute Care

## 2023-04-29 NOTE — Telephone Encounter (Signed)
Attending Physician's Statement - from [employer] - for disability was completed and given to Kandice Robinsons, NP for signature.  It needs to be faxed today if at all possible.

## 2023-04-30 NOTE — Telephone Encounter (Signed)
Attending Physician's statement was signed by Kandice Robinsons on 04/29/23 and faxed to [facility] - fax# [phone].  Hard copy was mailed to the patient.

## 2023-04-30 NOTE — Telephone Encounter (Signed)
Spoke to patient today regarding the work restrictions form received from [employer].  The form was completed and signed by Kandice Robinsons, NP.  I faxed form to [facility], attn: Chauncey Mann fax# [phone] and mailed hard copy to patient.

## 2023-05-05 NOTE — Telephone Encounter (Signed)
Called patient to remind him to come sign the release for [employer] and to pay the forms fee.  He asked me to just shred the form.  I did scan the form to patient's documents but did not send to [facility].

## 2023-05-15 ENCOUNTER — Other Ambulatory Visit (HOSPITAL_COMMUNITY): Payer: Self-pay | Admitting: Radiology

## 2023-05-15 ENCOUNTER — Inpatient Hospital Stay (HOSPITAL_COMMUNITY): Admission: RE | Admit: 2023-05-15 | Payer: BC Managed Care – PPO | Source: Ambulatory Visit

## 2023-05-15 ENCOUNTER — Ambulatory Visit (HOSPITAL_COMMUNITY)
Admission: RE | Admit: 2023-05-15 | Discharge: 2023-05-15 | Disposition: A | Discharge status: Home / Self Care | Payer: Self-pay | Source: Ambulatory Visit | Attending: Thoracic Surgery (Cardiothoracic Vascular Surgery) | Admitting: Thoracic Surgery (Cardiothoracic Vascular Surgery)

## 2023-05-15 DIAGNOSIS — J986 Disorders of diaphragm: Secondary | ICD-10-CM

## 2023-05-15 LAB — PULMONARY FUNCTION TEST
DL/VA % pred: 149 %
DL/VA: 6.39 ml/min/mmHg/L
DLCO unc % pred: 48 %
DLCO unc: 12.58 ml/min/mmHg
FEF 25-75 Pre: 1.09 L/sec
FEF2575-%Pred-Pre: 38 %
FEV1-%Pred-Pre: 22 %
FEV1-Pre: 0.76 L
FEV1FVC-%Pred-Pre: 117 %
FEV6-%Pred-Pre: 20 %
FEV6-Pre: 0.85 L
FEV6FVC-%Pred-Pre: 104 %
FVC-%Pred-Pre: 19 %
FVC-Pre: 0.85 L
Pre FEV1/FVC ratio: 89 %
Pre FEV6/FVC Ratio: 100 %
RV % pred: 80 %
RV: 1.71 L
TLC % pred: 44 %
TLC: 2.94 L

## 2023-05-15 LAB — BLOOD GAS, ARTERIAL
Acid-Base Excess: 2.9 mmol/L — ABNORMAL HIGH (ref 0.0–2.0)
Bicarbonate: 27.9 mmol/L (ref 20.0–28.0)
Drawn by: 211791
O2 Saturation: 99.8 %
Patient temperature: 37
pCO2 arterial: 43 mmHg (ref 32–48)
pH, Arterial: 7.42 (ref 7.35–7.45)
pO2, Arterial: 148 mmHg — ABNORMAL HIGH (ref 83–108)

## 2023-05-16 ENCOUNTER — Ambulatory Visit (INDEPENDENT_AMBULATORY_CARE_PROVIDER_SITE_OTHER): Payer: Self-pay | Admitting: Thoracic Surgery (Cardiothoracic Vascular Surgery)

## 2023-05-16 DIAGNOSIS — J986 Disorders of diaphragm: Secondary | ICD-10-CM

## 2023-05-16 NOTE — Progress Notes (Signed)
     [address]       [place] [address]             [phone]       Patient: Home Provider: Office Consent for Telemedicine visit obtained.  Today's visit was completed via a real-time telehealth (see specific modality noted below). The patient/authorized person provided oral consent at the time of the visit to engage in a telemedicine encounter with the present provider at [facility]. The patient/authorized person was informed of the potential benefits, limitations, and risks of telemedicine. The patient/authorized person expressed understanding that the laws that protect confidentiality also apply to telemedicine. The patient/authorized person acknowledged understanding that telemedicine does not provide emergency services and that he or she would need to call 911 or proceed to the nearest hospital for help if such a need arose.   Total time spent in the clinical discussion 10 minutes.  Telehealth Modality: Phone visit (audio only)  I had a telephone visit with [place].  I reviewed his ABG and PFTs.  His PaO2, and PCO2 all looked good.  He has diminished capacity due to the paralyzed diaphragm, but currently is not interest in a plication.  He is scheduled to meet with pulm in December.  He will call with any further questions.

## 2023-06-19 DIAGNOSIS — I700 Atherosclerosis of aorta: Secondary | ICD-10-CM | POA: Diagnosis not present

## 2023-06-19 DIAGNOSIS — J9600 Acute respiratory failure, unspecified whether with hypoxia or hypercapnia: Secondary | ICD-10-CM | POA: Diagnosis not present

## 2023-06-19 DIAGNOSIS — E119 Type 2 diabetes mellitus without complications: Secondary | ICD-10-CM | POA: Diagnosis not present

## 2023-06-19 DIAGNOSIS — J986 Disorders of diaphragm: Secondary | ICD-10-CM | POA: Diagnosis not present

## 2023-06-23 DIAGNOSIS — J9610 Chronic respiratory failure, unspecified whether with hypoxia or hypercapnia: Secondary | ICD-10-CM | POA: Diagnosis not present

## 2023-06-23 DIAGNOSIS — J984 Other disorders of lung: Secondary | ICD-10-CM | POA: Diagnosis not present

## 2023-07-04 ENCOUNTER — Other Ambulatory Visit: Payer: Self-pay | Admitting: Medical Genetics

## 2023-07-04 ENCOUNTER — Other Ambulatory Visit: Payer: Self-pay

## 2023-07-04 DIAGNOSIS — Z1379 Encounter for other screening for genetic and chromosomal anomalies: Secondary | ICD-10-CM

## 2023-07-04 DIAGNOSIS — Z006 Encounter for examination for normal comparison and control in clinical research program: Secondary | ICD-10-CM

## 2023-07-15 LAB — GENECONNECT MOLECULAR SCREEN: Genetic Analysis Overall Interpretation: NEGATIVE

## 2023-07-20 DIAGNOSIS — J986 Disorders of diaphragm: Secondary | ICD-10-CM | POA: Diagnosis not present

## 2023-07-20 DIAGNOSIS — J9600 Acute respiratory failure, unspecified whether with hypoxia or hypercapnia: Secondary | ICD-10-CM | POA: Diagnosis not present

## 2023-07-20 DIAGNOSIS — E119 Type 2 diabetes mellitus without complications: Secondary | ICD-10-CM | POA: Diagnosis not present

## 2023-07-20 DIAGNOSIS — I700 Atherosclerosis of aorta: Secondary | ICD-10-CM | POA: Diagnosis not present

## 2023-07-22 ENCOUNTER — Ambulatory Visit: Payer: Commercial Managed Care - PPO | Admitting: Pulmonary Disease

## 2023-07-22 ENCOUNTER — Encounter: Payer: Self-pay | Admitting: Pulmonary Disease

## 2023-07-22 VITALS — BP 120/78 | HR 74 | Temp 98.2°F | Ht 69.0 in | Wt 266.4 lb

## 2023-07-22 DIAGNOSIS — R0602 Shortness of breath: Secondary | ICD-10-CM

## 2023-07-22 DIAGNOSIS — J984 Other disorders of lung: Secondary | ICD-10-CM

## 2023-07-22 DIAGNOSIS — J986 Disorders of diaphragm: Secondary | ICD-10-CM

## 2023-07-22 NOTE — Progress Notes (Signed)
George Wallace    [id number]    01-05-1961  Primary Care Physician:McNeill, Toniann Fail, MD  Referring Physician: Gweneth Dimitri, MD [address] [place],  [place] [address]  Chief complaint:   Shortness of breath Paralyzed hemidiaphragm following thymus surgery  HPI:  Has been following up with cardiothoracic surgery -Considered diaphragmatic stimulation and plication of his diaphragm -He is still doing his research, not desiring to move forward with the above at present  Does get short of breath with activity  He did complete cardiopulmonary rehab and feels better Exercises regularly spending 50 minutes on the treadmill and 50 minutes on the elliptical -Does not wake train at present  He has come down from 300 pounds to about 260  He does use Lasix for weight gain, whenever his weight trends up without explanation he will go up on his Lasix for few days  Has been coughing more lately with production of some phlegm Does not feel acutely ill  Does use an incentive spirometer  Was exposed to secondhand smoke growing up but never smoked himself  Works for [employer] he was not having any significant respiratory problems 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   albuterol (VENTOLIN HFA) 108 (90 Base) MCG/ACT inhaler Inhale 2 puffs into the lungs every 4 (four) hours as needed for wheezing.   aspirin EC 325 MG tablet Take 1 tablet (325 mg total) by mouth daily. (Patient taking differently: Take 325 mg by mouth as needed.)   Blood Glucose Monitoring Suppl (ACCU-CHEK GUIDE) w/Device KIT Use to check blood sugar daily   furosemide (LASIX) 40 MG tablet Take 1 tablet (40 mg total) by mouth daily. You may take 1 extra dose  (40 mg total) by mouth daily as needed for swelling/weight gain.   glucose blood (ACCU-CHEK GUIDE) test strip Use to check blood sugar daily   ibuprofen (ADVIL) 200 MG tablet Take 600 mg by mouth as needed.   potassium chloride SA (KLOR-CON M) 20 MEQ tablet Take 1 tablet (20 mEq total) by mouth daily. You may take 1 extra dose (20 mEq total) by mouth daily as needed when you administer extra lasix.   rosuvastatin (CRESTOR) 10 MG tablet Take 10 mg by mouth every evening.   Spacer/Aero-Holding Chambers (AEROCHAMBER PLUS FLO-VU LARGE) MISC 1 each by Other route once.   No facility-administered encounter medications on file as of 07/22/2023.    Allergies as of 07/22/2023   (No Known Allergies)    Past Medical History:  Diagnosis Date   Diabetes (HCC)    Diet controlled   Hyperlipidemia     Past Surgical History:  Procedure Laterality Date   INTERCOSTAL NERVE BLOCK  10/07/2022   Procedure: INTERCOSTAL NERVE BLOCK;  Surgeon: Corliss Skains, MD;  Location: [facility] OR;  Service: Thoracic;;   SINUS SURGERY WITH INSTATRAK      Family History  Problem Relation Age of Onset   Heart disease Other        No family history   Emphysema Father     Social History   Socioeconomic History   Marital status: Married    Spouse name: Not on file   Number of children: 2  Years of education: Not on file   Highest education level: Not on file  Occupational History   Not on file  Tobacco Use   Smoking status: Never   Smokeless tobacco: Never   Tobacco comments:    both parents smoked in home growing up.   Vaping Use   Vaping status: Never Used  Substance and Sexual Activity   Alcohol use: No    Alcohol/week: 0.0 standard drinks of alcohol   Drug use: No   Sexual activity: Not on file  Other Topics Concern   Not on file  Social History Narrative   Not on file   Social Determinants of Health   Financial Resource Strain: Not on file  Food Insecurity: No Food Insecurity (10/07/2022)    Hunger Vital Sign    Worried About Running Out of Food in the Last Year: Never true    Ran Out of Food in the Last Year: Never true  Transportation Needs: No Transportation Needs (10/07/2022)   PRAPARE - Administrator, Civil Service (Medical): No    Lack of Transportation (Non-Medical): No  Physical Activity: Not on file  Stress: Not on file  Social Connections: Not on file  Intimate Partner Violence: Not At Risk (10/07/2022)   Humiliation, Afraid, Rape, and Kick questionnaire    Fear of Current or Ex-Partner: No    Emotionally Abused: No    Physically Abused: No    Sexually Abused: No    Review of Systems  Respiratory:  Positive for cough and shortness of breath.     Vitals:   07/22/23 0856  BP: 120/78  Pulse: 74  Temp: 98.2 F (36.8 C)  SpO2: 97%     Physical Exam Constitutional:      Appearance: He is obese.  HENT:     Head: Normocephalic.     Mouth/Throat:     Mouth: Mucous membranes are moist.  Eyes:     General: No scleral icterus. Cardiovascular:     Rate and Rhythm: Normal rate and regular rhythm.     Heart sounds: No murmur heard.    No friction rub.  Pulmonary:     Effort: No respiratory distress.     Breath sounds: No stridor. No wheezing or rhonchi.  Musculoskeletal:        General: No swelling.     Cervical back: No rigidity or tenderness.  Neurological:     Mental Status: He is alert.  Psychiatric:        Mood and Affect: Mood normal.      Data Reviewed: PFT was reviewed showing combined obstruction and restriction  Echocardiogram January 2024 was reviewed showing normal ejection fraction, no mention of diastolic dysfunction or systolic dysfunction  CT scan from 2023 reviewed with the patient as well  Most recent chest x-ray  Assessment:  Shortness of breath on exertion  Paralyzed hemidiaphragm  S/p resection of mediastinal mass and sternotomy 10/07/2022  High risk for hypercarbic respiratory failure -On BiPAP  therapy  Plan/Recommendations: Continue using your BiPAP on a regular basis  Graded activities as tolerated  Increase activity as tolerated  I did commend patient on dropping his weight from 300-260s  I did encourage him to research inspiratory and expiratory muscle trainers as this may help his breathing overall  Continue weight loss efforts  Encouraged to call with significant concerns  Follow-up in 6 months   Virl Diamond MD [facility] 07/22/2023, 8:59 AM  CC: Gweneth Dimitri,  MD

## 2023-07-22 NOTE — Patient Instructions (Addendum)
I will see you back in 6 months  Focus on getting stronger  Continue current lines of care  Regular exercises  Weight training exercises  As we looked up today, inspiratory and expiratory muscle trainers may help with your pulmonary function--will will looked up online was [employer]--do some more research on this yourself  Continue using your BiPAP nightly  Call us with any significant concerns

## 2023-07-24 ENCOUNTER — Other Ambulatory Visit: Payer: Self-pay

## 2023-07-24 ENCOUNTER — Other Ambulatory Visit (HOSPITAL_COMMUNITY): Payer: Self-pay

## 2023-07-24 DIAGNOSIS — R600 Localized edema: Secondary | ICD-10-CM

## 2023-07-24 DIAGNOSIS — I4891 Unspecified atrial fibrillation: Secondary | ICD-10-CM

## 2023-07-24 DIAGNOSIS — Z79899 Other long term (current) drug therapy: Secondary | ICD-10-CM

## 2023-07-24 DIAGNOSIS — J9610 Chronic respiratory failure, unspecified whether with hypoxia or hypercapnia: Secondary | ICD-10-CM | POA: Diagnosis not present

## 2023-07-24 DIAGNOSIS — J986 Disorders of diaphragm: Secondary | ICD-10-CM

## 2023-07-24 DIAGNOSIS — J9859 Other diseases of mediastinum, not elsewhere classified: Secondary | ICD-10-CM

## 2023-07-24 DIAGNOSIS — J984 Other disorders of lung: Secondary | ICD-10-CM | POA: Diagnosis not present

## 2023-07-24 DIAGNOSIS — R931 Abnormal findings on diagnostic imaging of heart and coronary circulation: Secondary | ICD-10-CM

## 2023-07-24 MED ORDER — POTASSIUM CHLORIDE CRYS ER 20 MEQ PO TBCR
20.0000 meq | EXTENDED_RELEASE_TABLET | Freq: Every day | ORAL | 2 refills | Status: AC
  Filled 2023-07-24: qty 135, 68d supply, fill #0

## 2023-07-24 MED ORDER — FUROSEMIDE 40 MG PO TABS
40.0000 mg | ORAL_TABLET | Freq: Every day | ORAL | 2 refills | Status: AC
  Filled 2023-07-24: qty 135, 68d supply, fill #0

## 2023-07-24 NOTE — Telephone Encounter (Signed)
Pt's medications were sent to pt's pharmacy as requested. Confirmation received.  

## 2023-08-19 DIAGNOSIS — J986 Disorders of diaphragm: Secondary | ICD-10-CM | POA: Diagnosis not present

## 2023-08-19 DIAGNOSIS — I700 Atherosclerosis of aorta: Secondary | ICD-10-CM | POA: Diagnosis not present

## 2023-08-19 DIAGNOSIS — E119 Type 2 diabetes mellitus without complications: Secondary | ICD-10-CM | POA: Diagnosis not present

## 2023-08-19 DIAGNOSIS — J9600 Acute respiratory failure, unspecified whether with hypoxia or hypercapnia: Secondary | ICD-10-CM | POA: Diagnosis not present

## 2023-08-23 DIAGNOSIS — J984 Other disorders of lung: Secondary | ICD-10-CM | POA: Diagnosis not present

## 2023-08-23 DIAGNOSIS — J9610 Chronic respiratory failure, unspecified whether with hypoxia or hypercapnia: Secondary | ICD-10-CM | POA: Diagnosis not present

## 2023-09-19 DIAGNOSIS — J9600 Acute respiratory failure, unspecified whether with hypoxia or hypercapnia: Secondary | ICD-10-CM | POA: Diagnosis not present

## 2023-09-19 DIAGNOSIS — J986 Disorders of diaphragm: Secondary | ICD-10-CM | POA: Diagnosis not present

## 2023-09-19 DIAGNOSIS — E119 Type 2 diabetes mellitus without complications: Secondary | ICD-10-CM | POA: Diagnosis not present

## 2023-09-19 DIAGNOSIS — I700 Atherosclerosis of aorta: Secondary | ICD-10-CM | POA: Diagnosis not present

## 2023-09-23 DIAGNOSIS — J984 Other disorders of lung: Secondary | ICD-10-CM | POA: Diagnosis not present

## 2023-09-23 DIAGNOSIS — J9610 Chronic respiratory failure, unspecified whether with hypoxia or hypercapnia: Secondary | ICD-10-CM | POA: Diagnosis not present

## 2023-09-23 DIAGNOSIS — Z9989 Dependence on other enabling machines and devices: Secondary | ICD-10-CM

## 2023-09-24 ENCOUNTER — Telehealth: Payer: Self-pay | Admitting: Pulmonary Disease

## 2023-09-24 NOTE — Telephone Encounter (Signed)
I do not see an order

## 2023-09-24 NOTE — Telephone Encounter (Signed)
PT would like to change his BIPAP provider to [facility] stating it is cheaper. Please call to advise @ [phone]

## 2023-09-29 NOTE — Telephone Encounter (Signed)
Dr. Wynona Neat, The patient would like to change DME companies from [facility] to [facility].  Please advise if ok to change.  Thank you.

## 2023-09-30 NOTE — Telephone Encounter (Signed)
Okay to change? 

## 2023-10-01 NOTE — Telephone Encounter (Signed)
DME order placed. Message sent to patient to make him aware.

## 2023-10-01 NOTE — Telephone Encounter (Signed)
See my chart encounter.

## 2023-10-09 DIAGNOSIS — Z125 Encounter for screening for malignant neoplasm of prostate: Secondary | ICD-10-CM | POA: Diagnosis not present

## 2023-10-09 DIAGNOSIS — E1169 Type 2 diabetes mellitus with other specified complication: Secondary | ICD-10-CM | POA: Diagnosis not present

## 2023-10-13 ENCOUNTER — Ambulatory Visit: Payer: Commercial Managed Care - PPO | Attending: Cardiology | Admitting: Cardiology

## 2023-10-13 ENCOUNTER — Encounter: Payer: Self-pay | Admitting: Cardiology

## 2023-10-13 ENCOUNTER — Other Ambulatory Visit (HOSPITAL_COMMUNITY): Payer: Self-pay

## 2023-10-13 VITALS — BP 118/62 | HR 72 | Ht 69.0 in | Wt 271.8 lb

## 2023-10-13 DIAGNOSIS — I700 Atherosclerosis of aorta: Secondary | ICD-10-CM | POA: Diagnosis not present

## 2023-10-13 DIAGNOSIS — E1169 Type 2 diabetes mellitus with other specified complication: Secondary | ICD-10-CM | POA: Diagnosis not present

## 2023-10-13 DIAGNOSIS — E785 Hyperlipidemia, unspecified: Secondary | ICD-10-CM | POA: Diagnosis not present

## 2023-10-13 DIAGNOSIS — Z6841 Body Mass Index (BMI) 40.0 and over, adult: Secondary | ICD-10-CM | POA: Diagnosis not present

## 2023-10-13 DIAGNOSIS — G4733 Obstructive sleep apnea (adult) (pediatric): Secondary | ICD-10-CM | POA: Diagnosis not present

## 2023-10-13 DIAGNOSIS — I2584 Coronary atherosclerosis due to calcified coronary lesion: Secondary | ICD-10-CM | POA: Diagnosis not present

## 2023-10-13 DIAGNOSIS — I10 Essential (primary) hypertension: Secondary | ICD-10-CM | POA: Diagnosis not present

## 2023-10-13 DIAGNOSIS — J986 Disorders of diaphragm: Secondary | ICD-10-CM | POA: Diagnosis not present

## 2023-10-13 DIAGNOSIS — Z8709 Personal history of other diseases of the respiratory system: Secondary | ICD-10-CM | POA: Diagnosis not present

## 2023-10-13 DIAGNOSIS — J984 Other disorders of lung: Secondary | ICD-10-CM | POA: Diagnosis not present

## 2023-10-13 MED ORDER — ROSUVASTATIN CALCIUM 20 MG PO TABS
20.0000 mg | ORAL_TABLET | ORAL | 3 refills | Status: AC
  Filled 2023-10-13: qty 12, 84d supply, fill #0

## 2023-10-13 NOTE — Progress Notes (Signed)
Cardiology Office Note:  .   Date:  10/13/2023  ID:  George Wallace, DOB 1960/11/01, MRN [id number] PCP: Gweneth Dimitri, MD  [facility] Providers Cardiologist:  Donato Schultz, MD     History of Present Illness: .   George Wallace is a 62 y.o. male Discussed with the use of AI scribe  History of Present Illness   The patient, a 62 year old male with a history of thymic and mediastinal mass removal via robotic-assisted resection, presents for follow-up. The surgery, performed on 10/07/2022, resulted in left phrenic nerve disruption due to the nerve's deep involvement with the tumor. Postoperatively, the patient experienced atrial fibrillation, which was managed with a short course of amiodarone. Cardiac monitoring showed no further evidence of atrial fibrillation.  The patient has been managing left hemidiaphragm paralysis with BiPAP and has been sleeping in a recliner. He reports some intermittent chest wall discomfort, which has improved since the surgery. He also experienced lower extremity edema, which was managed with Lasix 40mg  daily and additional doses as needed, a low sodium diet, and compression socks. The patient declined a vascular referral for vein analysis, and the edema is likely due to venous stasis.  The patient has a history of coronary artery calcification, with a calcium score of 4.3, placing him in the 25th percentile for his age. He has been prescribed low-dose Crestor 10mg  daily, with a goal LDL of less than 70. The patient also has hypertension and has completed pulmonary rehab. He has lost significant weight, dropping from 300 to 260 pounds.  Despite these improvements, the patient reports nocturia, which has been disruptive and frustrating. He has not experienced any urinary accidents, pain, or hematuria.          Studies Reviewed: Marland Kitchen   EKG Interpretation Date/Time:  Monday October 13 2023 08:17:30 EST Ventricular Rate:  72 PR Interval:  152 QRS  Duration:  92 QT Interval:  370 QTC Calculation: 405 R Axis:   47  Text Interpretation: Normal sinus rhythm Normal ECG When compared with ECG of 08-Oct-2022 07:04, No significant change was found Confirmed by Donato Schultz ([id number]) on 10/13/2023 8:20:34 AM    Results   LABS BMP: normal (11/27/2022) Creatinine: 1.1 (11/27/2022) Hemoglobin: 13.4 (11/27/2022) Sodium: 142 (11/27/2022) LDL: 71  RADIOLOGY CT: Coronary artery calcification, calcium score 4.3, 25% for age  DIAGNOSTIC Echocardiogram: EF 60-65%, normal diastolic parameters, normal valvular function (11/20/2022) Cardiac monitor: No evidence of further atrial fibrillation EKG: Normal (10/13/2023)     Risk Assessment/Calculations:            Physical Exam:   VS:  BP 118/62   Pulse 72   Ht 5\' 9"  (1.753 m)   Wt 271 lb 12.8 oz (123.3 kg)   SpO2 96%   BMI 40.14 kg/m    Wt Readings from Last 3 Encounters:  10/13/23 271 lb 12.8 oz (123.3 kg)  07/22/23 266 lb 6.4 oz (120.8 kg)  04/25/23 267 lb (121.1 kg)    GEN: Well nourished, well developed in no acute distress NECK: No JVD; No carotid bruits CARDIAC: RRR, no murmurs, no rubs, no gallops RESPIRATORY:  Clear to auscultation without rales, wheezing or rhonchi  ABDOMEN: Soft, non-tender, non-distended EXTREMITIES:  No edema; No deformity   ASSESSMENT AND PLAN: .    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 Persistent left hemidiaphragm paralysis necessitates BiPAP  at night and sleeping in a recliner. Patient is hesitant about hemidiaphragm plication surgery. Discussed surgical risks and benefits, including potential complications and improved respiratory function. Patient prefers to avoid surgery at this time. - Continue BiPAP at night - Follow up with pulmonary and CT surgery as needed  Postoperative  Atrial Fibrillation Postoperative atrial fibrillation with no recurrence. Previously on amiodarone, now discontinued. CHADS-VASc score is 1; no anticoagulation needed for isolated postoperative episode. Discussed monitoring for recurrence. - Monitor for atrial fibrillation recurrence  Coronary Artery Calcification Coronary artery calcification with a calcium score of 4.3 (25th percentile for age). Prior LDL was 71 with a goal of <16. Patient on low dose Crestor 10 mg daily but admits to non-compliance. Discussed importance of medication adherence to manage calcification and reduce cardiovascular risk. - Encourage compliance with Crestor 10 mg daily - Reinforce importance of medication adherence  Hypertension Blood pressure well-controlled. - Continue monitoring blood pressure  Lower Extremity Edema Likely venous stasis. Managed with Lasix 40 mg daily and as needed, low sodium diet, and compression socks. Patient declined vascular referral. Lasix effective but frequent urination is bothersome. - Continue Lasix 40 mg daily and as needed - Continue low sodium diet - Continue wearing compression socks - Monitor for worsening edema  General Health Maintenance Patient has lost weight from 300 to 260 pounds. Encouraged to continue weight loss and maintain a healthy diet. Discussed benefits of Mediterranean diet for cardiovascular health. - Encourage Mediterranean diet - Continue weight loss efforts  Follow-up - As needed follow-up.              Signed, Donato Schultz, MD

## 2023-10-13 NOTE — Patient Instructions (Signed)
Medication Instructions:  The current medical regimen is effective;  continue present plan and medications.  *If you need a refill on your cardiac medications before your next appointment, please call your pharmacy*  Follow-Up: At [facility], you and your health needs are our priority.  As part of our continuing mission to provide you with exceptional heart care, we have created designated Provider Care Teams.  These Care Teams include your primary Cardiologist (physician) and Advanced Practice Providers (APPs -  Physician Assistants and Nurse Practitioners) who all work together to provide you with the care you need, when you need it.  We recommend signing up for the patient portal called "MyChart".  Sign up information is provided on this After Visit Summary.  MyChart is used to connect with patients for Virtual Visits (Telemedicine).  Patients are able to view lab/test results, encounter notes, upcoming appointments, etc.  Non-urgent messages can be sent to your provider as well.   To learn more about what you can do with MyChart, go to [email or web address].    Your next appointment:   Follow up as needed with Dr Skains   

## 2023-10-14 NOTE — Telephone Encounter (Signed)
Order was sent to [facility] but order was canceled by [facility] for some reason. Patient is calling to get clarification. They also need a sleep study to be sent to [facility] so patient is reaching out. [phone].

## 2023-10-15 ENCOUNTER — Other Ambulatory Visit (HOSPITAL_COMMUNITY): Payer: Self-pay

## 2023-10-15 ENCOUNTER — Telehealth: Payer: Self-pay | Admitting: Pulmonary Disease

## 2023-10-15 NOTE — Telephone Encounter (Signed)
Patient states [facility] needs sleep study results. Patient phone number is [phone].

## 2023-10-15 NOTE — Telephone Encounter (Signed)
Spoke to George Wallace with [employer] and he spoke with patient about what they needed for the Cpap supplies. Patient is unable to locate sleept study or titration information and would need to have new sleep study ordered.

## 2023-10-16 ENCOUNTER — Encounter: Payer: Self-pay | Admitting: Internal Medicine

## 2023-10-16 NOTE — Telephone Encounter (Signed)
New, Loreli Dollar, Raven Luvenia Redden; [place], Almyra Free,  Mr Tashjian was able to email me a copy of his sleep study.  I can send you a copy if you give me a fax number.  Also, the order you have says its on hold, I will need that updated. Also, I think there is some confusion on what the order is for. Patient states he needs new Bipap but the order is just for Transition of care.  Please advise.  Thank you,  Luellen Pucker

## 2023-10-16 NOTE — Telephone Encounter (Signed)
Pt needs order for Bipap, pt was placed on bipap in the hospital. Wanted to switch dme companies to get a new bipap. [facility] wanted sleep study but sleep study is not how he received bipap. Sleep study has been uploaded in chart Dr.O please advise on next steps

## 2023-10-18 NOTE — Telephone Encounter (Signed)
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  Previous sleep study shows mild obstructive sleep apnea.  He does not need a new sleep study  Needs order to new DME company BiPAP 12/5 for hypercapnic respiratory failure

## 2023-10-19 DIAGNOSIS — E119 Type 2 diabetes mellitus without complications: Secondary | ICD-10-CM | POA: Diagnosis not present

## 2023-10-19 DIAGNOSIS — J986 Disorders of diaphragm: Secondary | ICD-10-CM | POA: Diagnosis not present

## 2023-10-19 DIAGNOSIS — I700 Atherosclerosis of aorta: Secondary | ICD-10-CM | POA: Diagnosis not present

## 2023-10-19 DIAGNOSIS — J9600 Acute respiratory failure, unspecified whether with hypoxia or hypercapnia: Secondary | ICD-10-CM | POA: Diagnosis not present

## 2023-10-20 ENCOUNTER — Other Ambulatory Visit: Payer: Self-pay | Admitting: *Deleted

## 2023-10-20 DIAGNOSIS — J9612 Chronic respiratory failure with hypercapnia: Secondary | ICD-10-CM

## 2023-10-23 DIAGNOSIS — J9610 Chronic respiratory failure, unspecified whether with hypoxia or hypercapnia: Secondary | ICD-10-CM | POA: Diagnosis not present

## 2023-10-23 DIAGNOSIS — J984 Other disorders of lung: Secondary | ICD-10-CM | POA: Diagnosis not present

## 2023-10-28 NOTE — Telephone Encounter (Signed)
 Did we send in an order ready for this patient for BiPAP 12/5 for hypercapnic respiratory failure to [facility]?  Is anything else that needs done?

## 2023-10-30 NOTE — Telephone Encounter (Signed)
 New Bipap order was placed on 12/23 for [facility]. NFN as sleep study has been scanned into chart.

## 2023-11-13 ENCOUNTER — Telehealth: Payer: Self-pay | Admitting: Pulmonary Disease

## 2023-11-13 NOTE — Telephone Encounter (Signed)
Received a request for medical records from [facility].   Faxed back to [employer], fax# [phone], with a note to send requests to [facility] HIM Medical Records - included the HIM fax#.

## 2023-11-19 DIAGNOSIS — J986 Disorders of diaphragm: Secondary | ICD-10-CM | POA: Diagnosis not present

## 2023-11-19 DIAGNOSIS — I700 Atherosclerosis of aorta: Secondary | ICD-10-CM | POA: Diagnosis not present

## 2023-11-19 DIAGNOSIS — J9600 Acute respiratory failure, unspecified whether with hypoxia or hypercapnia: Secondary | ICD-10-CM | POA: Diagnosis not present

## 2023-11-19 DIAGNOSIS — E119 Type 2 diabetes mellitus without complications: Secondary | ICD-10-CM | POA: Diagnosis not present

## 2023-11-25 NOTE — Telephone Encounter (Signed)
Patient dropped off a letter dated 10/15/23 from [employer] requesting medical records.  I called him and let him know that I faxed the requested records to insurance on 11/13/2023.

## 2023-12-05 DIAGNOSIS — J986 Disorders of diaphragm: Secondary | ICD-10-CM | POA: Diagnosis not present

## 2023-12-05 DIAGNOSIS — J9600 Acute respiratory failure, unspecified whether with hypoxia or hypercapnia: Secondary | ICD-10-CM | POA: Diagnosis not present

## 2023-12-05 DIAGNOSIS — E662 Morbid (severe) obesity with alveolar hypoventilation: Secondary | ICD-10-CM | POA: Diagnosis not present

## 2023-12-05 DIAGNOSIS — E119 Type 2 diabetes mellitus without complications: Secondary | ICD-10-CM | POA: Diagnosis not present

## 2023-12-05 DIAGNOSIS — I700 Atherosclerosis of aorta: Secondary | ICD-10-CM | POA: Diagnosis not present

## 2023-12-20 DIAGNOSIS — J9600 Acute respiratory failure, unspecified whether with hypoxia or hypercapnia: Secondary | ICD-10-CM | POA: Diagnosis not present

## 2023-12-20 DIAGNOSIS — J986 Disorders of diaphragm: Secondary | ICD-10-CM | POA: Diagnosis not present

## 2023-12-20 DIAGNOSIS — I700 Atherosclerosis of aorta: Secondary | ICD-10-CM | POA: Diagnosis not present

## 2023-12-20 DIAGNOSIS — E119 Type 2 diabetes mellitus without complications: Secondary | ICD-10-CM | POA: Diagnosis not present

## 2024-01-02 DIAGNOSIS — J986 Disorders of diaphragm: Secondary | ICD-10-CM | POA: Diagnosis not present

## 2024-01-02 DIAGNOSIS — E119 Type 2 diabetes mellitus without complications: Secondary | ICD-10-CM | POA: Diagnosis not present

## 2024-01-02 DIAGNOSIS — E662 Morbid (severe) obesity with alveolar hypoventilation: Secondary | ICD-10-CM | POA: Diagnosis not present

## 2024-01-02 DIAGNOSIS — J9600 Acute respiratory failure, unspecified whether with hypoxia or hypercapnia: Secondary | ICD-10-CM | POA: Diagnosis not present

## 2024-01-02 DIAGNOSIS — I700 Atherosclerosis of aorta: Secondary | ICD-10-CM | POA: Diagnosis not present

## 2024-01-17 DIAGNOSIS — J9600 Acute respiratory failure, unspecified whether with hypoxia or hypercapnia: Secondary | ICD-10-CM | POA: Diagnosis not present

## 2024-01-17 DIAGNOSIS — E119 Type 2 diabetes mellitus without complications: Secondary | ICD-10-CM | POA: Diagnosis not present

## 2024-01-17 DIAGNOSIS — I700 Atherosclerosis of aorta: Secondary | ICD-10-CM | POA: Diagnosis not present

## 2024-01-17 DIAGNOSIS — J986 Disorders of diaphragm: Secondary | ICD-10-CM | POA: Diagnosis not present

## 2024-01-19 ENCOUNTER — Encounter: Payer: Self-pay | Admitting: Pulmonary Disease

## 2024-01-19 ENCOUNTER — Ambulatory Visit: Payer: Commercial Managed Care - PPO | Admitting: Pulmonary Disease

## 2024-01-19 VITALS — BP 127/82 | HR 66 | Ht 69.0 in | Wt 264.2 lb

## 2024-01-19 DIAGNOSIS — J9859 Other diseases of mediastinum, not elsewhere classified: Secondary | ICD-10-CM | POA: Diagnosis not present

## 2024-01-19 DIAGNOSIS — J9612 Chronic respiratory failure with hypercapnia: Secondary | ICD-10-CM | POA: Diagnosis not present

## 2024-01-19 DIAGNOSIS — Z9989 Dependence on other enabling machines and devices: Secondary | ICD-10-CM

## 2024-01-19 DIAGNOSIS — R5381 Other malaise: Secondary | ICD-10-CM | POA: Diagnosis not present

## 2024-01-19 NOTE — Patient Instructions (Signed)
 Continue using your BiPAP machine every night  Restricting sleep to about 7 hours will help consolidate your sleep  Graded activities as tolerated Regular exercises as tolerated  Call us with significant concerns  I will see you a year from now

## 2024-01-19 NOTE — Progress Notes (Signed)
 George Wallace    [id number]    1961-07-25  Primary Care Physician:McNeill, Toniann Fail, MD  Referring Physician: Gweneth Dimitri, MD [address] [place],  [place] [address]  Chief complaint:   Shortness of breath Paralyzed hemidiaphragm following thymus surgery Sleep disordered breathing  HPI:   No significant complaints since her last visit  Functioning well  Wakes up in the middle of the night sometimes unable to get back to sleep Does not affect how he feels during the day  Has remained active  Wakes up feeling like he is at a good nights rest  Shortness of breath with activity  He does have a history of a paralyzed diaphragm  -Following up with cardiothoracic surgery  Weight is relatively stable  Does use an incentive spirometer  Was exposed to secondhand smoke growing up but never smoked himself  Works for [employer] he was not having any significant respiratory problems prior to 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   albuterol (VENTOLIN HFA) 108 (90 Base) MCG/ACT inhaler Inhale 2 puffs into the lungs every 4 (four) hours as needed for wheezing.   aspirin EC 325 MG tablet Take 1 tablet (325 mg total) by mouth daily. (Patient taking differently: Take 325 mg by mouth as needed.)   Blood Glucose Monitoring Suppl (ACCU-CHEK GUIDE) w/Device KIT Use to check blood sugar daily   furosemide (LASIX) 40 MG tablet Take 1 tablet (40 mg total) by mouth daily. You may take 1 extra dose (40 mg total) by mouth daily as needed for swelling/weight gain.   glucose blood (ACCU-CHEK GUIDE) test strip Use to check blood sugar daily   ibuprofen (ADVIL) 200 MG tablet Take 600 mg by mouth as needed.   potassium chloride SA (KLOR-CON  M) 20 MEQ tablet Take 1 tablet (20 mEq total) by mouth daily. You may take 1 extra dose (20 mEq total) by mouth daily as needed when you administer extra lasix.   rosuvastatin (CRESTOR) 10 MG tablet Take 10 mg by mouth every evening.   rosuvastatin (CRESTOR) 20 MG tablet Take 1 tablet (20 mg total) by mouth once a week.   Spacer/Aero-Holding Chambers (AEROCHAMBER PLUS FLO-VU LARGE) MISC 1 each by Other route once.   No facility-administered encounter medications on file as of 01/19/2024.    Allergies as of 01/19/2024   (No Known Allergies)    Past Medical History:  Diagnosis Date   Diabetes (HCC)    Diet controlled   Hyperlipidemia     Past Surgical History:  Procedure Laterality Date   INTERCOSTAL NERVE BLOCK  10/07/2022   Procedure: INTERCOSTAL NERVE BLOCK;  Surgeon: Corliss Skains, MD;  Location: [facility] OR;  Service: Thoracic;;   SINUS SURGERY WITH INSTATRAK      Family History  Problem Relation Age of Onset   Heart disease Other        No family history   Emphysema Father     Social History   Socioeconomic History   Marital status: Married    Spouse name: Not on file   Number of children: 2   Years of education: Not on file   Highest education level: Not on file  Occupational History   Not  on file  Tobacco Use   Smoking status: Never   Smokeless tobacco: Never   Tobacco comments:    both parents smoked in home growing up.   Vaping Use   Vaping status: Never Used  Substance and Sexual Activity   Alcohol use: No    Alcohol/week: 0.0 standard drinks of alcohol   Drug use: No   Sexual activity: Not on file  Other Topics Concern   Not on file  Social History Narrative   Not on file   Social Drivers of Health   Financial Resource Strain: Not on file  Food Insecurity: No Food Insecurity (10/07/2022)   Hunger Vital Sign    Worried About Running Out of Food in the Last Year: Never true    Ran Out of Food in the Last Year: Never true  Transportation  Needs: No Transportation Needs (10/07/2022)   PRAPARE - Administrator, Civil Service (Medical): No    Lack of Transportation (Non-Medical): No  Physical Activity: Not on file  Stress: Not on file  Social Connections: Not on file  Intimate Partner Violence: Not At Risk (10/07/2022)   Humiliation, Afraid, Rape, and Kick questionnaire    Fear of Current or Ex-Partner: No    Emotionally Abused: No    Physically Abused: No    Sexually Abused: No    Review of Systems  Constitutional:  Negative for unexpected weight change.  Respiratory:  Positive for cough and shortness of breath.   Psychiatric/Behavioral:  Positive for sleep disturbance.     Vitals:   01/19/24 0842  BP: 127/82  Pulse: 66  SpO2: 98%     Physical Exam Constitutional:      Appearance: He is obese.  HENT:     Head: Normocephalic.     Mouth/Throat:     Mouth: Mucous membranes are moist.  Eyes:     General: No scleral icterus. Cardiovascular:     Rate and Rhythm: Normal rate and regular rhythm.     Heart sounds: No murmur heard.    No friction rub.  Pulmonary:     Effort: No respiratory distress.     Breath sounds: No stridor. No wheezing or rhonchi.  Musculoskeletal:        General: No swelling.     Cervical back: No rigidity or tenderness.  Neurological:     Mental Status: He is alert.  Psychiatric:        Mood and Affect: Mood normal.    Data Reviewed: PFT was reviewed showing combined obstruction and restriction  Echocardiogram January 2024 was reviewed showing normal ejection fraction, no mention of diastolic dysfunction or systolic dysfunction  CT scan from 2023 reviewed with the patient as well  BiPAP compliance download reviewed showing excellent compliance of 100% BiPAP 12/5 Residual AHI 1.0  Assessment:  History of hypercapnic respiratory failure -Continue BiPAP therapy  Paralyzed hemidiaphragm  History of resection of mediastinal mass and sternotomy  10/07/2022  Plan/Recommendations: Encouraged to continue using BiPAP on a nightly basis  Try and restrict sleep as possible to get about 7 hours of sleep  Increase activity as tolerated  Encouraged to continue weight loss efforts  Graded activities as tolerated  Follow-up in about a year  Encouraged to call with significant concerns   Virl Diamond MD [facility] 01/19/2024, 8:44 AM  CC: Gweneth Dimitri, MD

## 2024-01-28 ENCOUNTER — Telehealth: Payer: Self-pay | Admitting: Acute Care

## 2024-01-28 ENCOUNTER — Encounter: Payer: Self-pay | Admitting: Cardiology

## 2024-01-28 DIAGNOSIS — R600 Localized edema: Secondary | ICD-10-CM

## 2024-01-28 NOTE — Telephone Encounter (Signed)
 Long term Disability paper work from [employer]  financial was received on 01/28/24 via fax. Papers will be placed in a folder in Sarah NP's box

## 2024-02-02 DIAGNOSIS — J986 Disorders of diaphragm: Secondary | ICD-10-CM | POA: Diagnosis not present

## 2024-02-02 DIAGNOSIS — E119 Type 2 diabetes mellitus without complications: Secondary | ICD-10-CM | POA: Diagnosis not present

## 2024-02-02 DIAGNOSIS — J9600 Acute respiratory failure, unspecified whether with hypoxia or hypercapnia: Secondary | ICD-10-CM | POA: Diagnosis not present

## 2024-02-02 DIAGNOSIS — I700 Atherosclerosis of aorta: Secondary | ICD-10-CM | POA: Diagnosis not present

## 2024-02-02 DIAGNOSIS — E662 Morbid (severe) obesity with alveolar hypoventilation: Secondary | ICD-10-CM | POA: Diagnosis not present

## 2024-02-02 NOTE — Telephone Encounter (Signed)
 Office visit notes were faxed successfully to [facility] on April 4,2025

## 2024-02-10 NOTE — Telephone Encounter (Signed)
 Copied from CRM [id number]. Topic: Clinical - Home Health Verbal Orders >> Feb 09, 2024  3:55 PM Margarette Shawl wrote: Caller/Agency: Landon Pinion, Dr. Barney Libman office.  Callback Number: [phone] (Ref # [id number]) Service Requested: Rep is calling due to patient filling for long term disability through [facility]. Dr. Lajoyce Pikes is the reviewing physician for his claim. He is reaching out to speak with Dara Ear or her team to verify if he has any additional limitations or restrictions that she would like to add to the disability claim.   Any new concerns about the patient? No  Spoke with patient regarding prior message.Advised patient the long term disability forms have been faxed back to  [facility] on April 4,2025 .  Patient's voice was understanding.Nothing lese further needed.

## 2024-02-17 DIAGNOSIS — J9600 Acute respiratory failure, unspecified whether with hypoxia or hypercapnia: Secondary | ICD-10-CM | POA: Diagnosis not present

## 2024-02-17 DIAGNOSIS — I700 Atherosclerosis of aorta: Secondary | ICD-10-CM | POA: Diagnosis not present

## 2024-02-17 DIAGNOSIS — J986 Disorders of diaphragm: Secondary | ICD-10-CM | POA: Diagnosis not present

## 2024-02-17 DIAGNOSIS — E119 Type 2 diabetes mellitus without complications: Secondary | ICD-10-CM | POA: Diagnosis not present

## 2024-02-18 ENCOUNTER — Ambulatory Visit (HOSPITAL_COMMUNITY)
Admission: RE | Admit: 2024-02-18 | Discharge: 2024-02-18 | Disposition: A | Discharge status: Home / Self Care | Source: Ambulatory Visit | Attending: Cardiovascular Disease | Admitting: Cardiovascular Disease

## 2024-02-18 DIAGNOSIS — R600 Localized edema: Secondary | ICD-10-CM | POA: Insufficient documentation

## 2024-02-24 ENCOUNTER — Encounter: Payer: Self-pay | Admitting: Cardiology

## 2024-03-03 DIAGNOSIS — E119 Type 2 diabetes mellitus without complications: Secondary | ICD-10-CM | POA: Diagnosis not present

## 2024-03-03 DIAGNOSIS — J9600 Acute respiratory failure, unspecified whether with hypoxia or hypercapnia: Secondary | ICD-10-CM | POA: Diagnosis not present

## 2024-03-03 DIAGNOSIS — E662 Morbid (severe) obesity with alveolar hypoventilation: Secondary | ICD-10-CM | POA: Diagnosis not present

## 2024-03-03 DIAGNOSIS — I700 Atherosclerosis of aorta: Secondary | ICD-10-CM | POA: Diagnosis not present

## 2024-03-03 DIAGNOSIS — J986 Disorders of diaphragm: Secondary | ICD-10-CM | POA: Diagnosis not present

## 2024-03-18 DIAGNOSIS — J986 Disorders of diaphragm: Secondary | ICD-10-CM | POA: Diagnosis not present

## 2024-03-18 DIAGNOSIS — E119 Type 2 diabetes mellitus without complications: Secondary | ICD-10-CM | POA: Diagnosis not present

## 2024-03-18 DIAGNOSIS — I700 Atherosclerosis of aorta: Secondary | ICD-10-CM | POA: Diagnosis not present

## 2024-03-18 DIAGNOSIS — J9600 Acute respiratory failure, unspecified whether with hypoxia or hypercapnia: Secondary | ICD-10-CM | POA: Diagnosis not present

## 2024-04-03 DIAGNOSIS — J986 Disorders of diaphragm: Secondary | ICD-10-CM | POA: Diagnosis not present

## 2024-04-03 DIAGNOSIS — E119 Type 2 diabetes mellitus without complications: Secondary | ICD-10-CM | POA: Diagnosis not present

## 2024-04-03 DIAGNOSIS — I700 Atherosclerosis of aorta: Secondary | ICD-10-CM | POA: Diagnosis not present

## 2024-04-03 DIAGNOSIS — E662 Morbid (severe) obesity with alveolar hypoventilation: Secondary | ICD-10-CM | POA: Diagnosis not present

## 2024-04-03 DIAGNOSIS — J9600 Acute respiratory failure, unspecified whether with hypoxia or hypercapnia: Secondary | ICD-10-CM | POA: Diagnosis not present

## 2024-04-15 DIAGNOSIS — E785 Hyperlipidemia, unspecified: Secondary | ICD-10-CM | POA: Diagnosis not present

## 2024-04-15 DIAGNOSIS — E1169 Type 2 diabetes mellitus with other specified complication: Secondary | ICD-10-CM | POA: Diagnosis not present

## 2024-04-19 DIAGNOSIS — Z8709 Personal history of other diseases of the respiratory system: Secondary | ICD-10-CM | POA: Diagnosis not present

## 2024-04-19 DIAGNOSIS — R0689 Other abnormalities of breathing: Secondary | ICD-10-CM | POA: Diagnosis not present

## 2024-04-19 DIAGNOSIS — E785 Hyperlipidemia, unspecified: Secondary | ICD-10-CM | POA: Diagnosis not present

## 2024-04-19 DIAGNOSIS — E1169 Type 2 diabetes mellitus with other specified complication: Secondary | ICD-10-CM | POA: Diagnosis not present

## 2024-04-19 DIAGNOSIS — Z6841 Body Mass Index (BMI) 40.0 and over, adult: Secondary | ICD-10-CM | POA: Diagnosis not present

## 2024-04-21 ENCOUNTER — Encounter: Payer: Self-pay | Admitting: Pulmonary Disease

## 2024-04-21 ENCOUNTER — Encounter: Payer: Self-pay | Admitting: Cardiology

## 2024-04-21 NOTE — Telephone Encounter (Signed)
 FYI

## 2024-04-22 ENCOUNTER — Telehealth: Payer: Self-pay | Admitting: Pulmonary Disease

## 2024-04-22 NOTE — Telephone Encounter (Signed)
 Spirometry results showing severe restriction with severe reduction in diffusing capacity

## 2024-05-03 DIAGNOSIS — E662 Morbid (severe) obesity with alveolar hypoventilation: Secondary | ICD-10-CM | POA: Diagnosis not present

## 2024-05-03 DIAGNOSIS — E119 Type 2 diabetes mellitus without complications: Secondary | ICD-10-CM | POA: Diagnosis not present

## 2024-05-03 DIAGNOSIS — I700 Atherosclerosis of aorta: Secondary | ICD-10-CM | POA: Diagnosis not present

## 2024-05-03 DIAGNOSIS — J986 Disorders of diaphragm: Secondary | ICD-10-CM | POA: Diagnosis not present

## 2024-05-03 DIAGNOSIS — J9600 Acute respiratory failure, unspecified whether with hypoxia or hypercapnia: Secondary | ICD-10-CM | POA: Diagnosis not present

## 2024-06-03 DIAGNOSIS — J986 Disorders of diaphragm: Secondary | ICD-10-CM | POA: Diagnosis not present

## 2024-06-03 DIAGNOSIS — E119 Type 2 diabetes mellitus without complications: Secondary | ICD-10-CM | POA: Diagnosis not present

## 2024-06-03 DIAGNOSIS — E662 Morbid (severe) obesity with alveolar hypoventilation: Secondary | ICD-10-CM | POA: Diagnosis not present

## 2024-06-03 DIAGNOSIS — J9600 Acute respiratory failure, unspecified whether with hypoxia or hypercapnia: Secondary | ICD-10-CM | POA: Diagnosis not present

## 2024-06-03 DIAGNOSIS — I700 Atherosclerosis of aorta: Secondary | ICD-10-CM | POA: Diagnosis not present

## 2024-07-04 DIAGNOSIS — I700 Atherosclerosis of aorta: Secondary | ICD-10-CM | POA: Diagnosis not present

## 2024-07-04 DIAGNOSIS — J9600 Acute respiratory failure, unspecified whether with hypoxia or hypercapnia: Secondary | ICD-10-CM | POA: Diagnosis not present

## 2024-07-04 DIAGNOSIS — E119 Type 2 diabetes mellitus without complications: Secondary | ICD-10-CM | POA: Diagnosis not present

## 2024-07-04 DIAGNOSIS — J986 Disorders of diaphragm: Secondary | ICD-10-CM | POA: Diagnosis not present

## 2024-07-04 DIAGNOSIS — E662 Morbid (severe) obesity with alveolar hypoventilation: Secondary | ICD-10-CM | POA: Diagnosis not present

## 2024-07-19 DIAGNOSIS — E785 Hyperlipidemia, unspecified: Secondary | ICD-10-CM | POA: Diagnosis not present

## 2024-08-03 DIAGNOSIS — J986 Disorders of diaphragm: Secondary | ICD-10-CM | POA: Diagnosis not present

## 2024-08-03 DIAGNOSIS — J9600 Acute respiratory failure, unspecified whether with hypoxia or hypercapnia: Secondary | ICD-10-CM | POA: Diagnosis not present

## 2024-08-03 DIAGNOSIS — I700 Atherosclerosis of aorta: Secondary | ICD-10-CM | POA: Diagnosis not present

## 2024-08-03 DIAGNOSIS — E119 Type 2 diabetes mellitus without complications: Secondary | ICD-10-CM | POA: Diagnosis not present

## 2024-08-03 DIAGNOSIS — E662 Morbid (severe) obesity with alveolar hypoventilation: Secondary | ICD-10-CM | POA: Diagnosis not present

## 2024-09-03 DIAGNOSIS — I700 Atherosclerosis of aorta: Secondary | ICD-10-CM | POA: Diagnosis not present

## 2024-09-03 DIAGNOSIS — J9600 Acute respiratory failure, unspecified whether with hypoxia or hypercapnia: Secondary | ICD-10-CM | POA: Diagnosis not present

## 2024-09-03 DIAGNOSIS — J986 Disorders of diaphragm: Secondary | ICD-10-CM | POA: Diagnosis not present

## 2024-09-03 DIAGNOSIS — E662 Morbid (severe) obesity with alveolar hypoventilation: Secondary | ICD-10-CM | POA: Diagnosis not present

## 2024-09-03 DIAGNOSIS — E119 Type 2 diabetes mellitus without complications: Secondary | ICD-10-CM | POA: Diagnosis not present

## 2024-10-06 DIAGNOSIS — E785 Hyperlipidemia, unspecified: Secondary | ICD-10-CM | POA: Diagnosis not present

## 2024-10-06 DIAGNOSIS — Z125 Encounter for screening for malignant neoplasm of prostate: Secondary | ICD-10-CM | POA: Diagnosis not present

## 2024-10-06 DIAGNOSIS — E1169 Type 2 diabetes mellitus with other specified complication: Secondary | ICD-10-CM | POA: Diagnosis not present

## 2024-10-12 ENCOUNTER — Other Ambulatory Visit (HOSPITAL_COMMUNITY): Payer: Self-pay

## 2024-10-12 DIAGNOSIS — G4733 Obstructive sleep apnea (adult) (pediatric): Secondary | ICD-10-CM | POA: Diagnosis not present

## 2024-10-12 DIAGNOSIS — Z0000 Encounter for general adult medical examination without abnormal findings: Secondary | ICD-10-CM | POA: Diagnosis not present

## 2024-10-12 DIAGNOSIS — J986 Disorders of diaphragm: Secondary | ICD-10-CM | POA: Diagnosis not present

## 2024-10-12 DIAGNOSIS — I2510 Atherosclerotic heart disease of native coronary artery without angina pectoris: Secondary | ICD-10-CM | POA: Diagnosis not present

## 2024-10-12 DIAGNOSIS — E785 Hyperlipidemia, unspecified: Secondary | ICD-10-CM | POA: Diagnosis not present

## 2024-10-12 DIAGNOSIS — Z8709 Personal history of other diseases of the respiratory system: Secondary | ICD-10-CM | POA: Diagnosis not present

## 2024-10-12 DIAGNOSIS — Z6841 Body Mass Index (BMI) 40.0 and over, adult: Secondary | ICD-10-CM | POA: Diagnosis not present

## 2024-10-12 DIAGNOSIS — E119 Type 2 diabetes mellitus without complications: Secondary | ICD-10-CM | POA: Diagnosis not present

## 2024-10-12 MED ORDER — MOUNJARO 7.5 MG/0.5ML ~~LOC~~ SOAJ: 7.5000 mg | SUBCUTANEOUS | 3 refills | Status: AC

## 2024-10-12 MED ORDER — MOUNJARO 2.5 MG/0.5ML ~~LOC~~ SOAJ
2.5000 mg | SUBCUTANEOUS | 0 refills | Status: AC
  Filled 2024-10-12: qty 2, 28d supply, fill #0

## 2024-10-12 MED ORDER — MOUNJARO 5 MG/0.5ML ~~LOC~~ SOAJ: 5.0000 mg | SUBCUTANEOUS | 0 refills | Status: AC

## 2024-10-13 ENCOUNTER — Other Ambulatory Visit (HOSPITAL_COMMUNITY): Payer: Self-pay

## 2024-10-22 ENCOUNTER — Other Ambulatory Visit (HOSPITAL_COMMUNITY): Payer: Self-pay

## 2024-10-23 ENCOUNTER — Other Ambulatory Visit (HOSPITAL_COMMUNITY): Payer: Self-pay

## 2024-10-25 ENCOUNTER — Other Ambulatory Visit: Payer: Self-pay
